# Patient Record
Sex: Female | Born: 1935
Health system: Southern US, Community
[De-identification: ages and names within clinical notes are randomized; demographics above are authoritative.]

## PROBLEM LIST (undated history)

## (undated) DIAGNOSIS — M810 Age-related osteoporosis without current pathological fracture: Secondary | ICD-10-CM

## (undated) DIAGNOSIS — I9789 Other postprocedural complications and disorders of the circulatory system, not elsewhere classified: Secondary | ICD-10-CM

## (undated) DIAGNOSIS — J45909 Unspecified asthma, uncomplicated: Secondary | ICD-10-CM

## (undated) DIAGNOSIS — K219 Gastro-esophageal reflux disease without esophagitis: Secondary | ICD-10-CM

## (undated) DIAGNOSIS — T8859XA Other complications of anesthesia, initial encounter: Secondary | ICD-10-CM

## (undated) DIAGNOSIS — J302 Other seasonal allergic rhinitis: Secondary | ICD-10-CM

## (undated) DIAGNOSIS — E785 Hyperlipidemia, unspecified: Secondary | ICD-10-CM

## (undated) DIAGNOSIS — I10 Essential (primary) hypertension: Secondary | ICD-10-CM

## (undated) DIAGNOSIS — I4891 Unspecified atrial fibrillation: Secondary | ICD-10-CM

## (undated) DIAGNOSIS — M1990 Unspecified osteoarthritis, unspecified site: Secondary | ICD-10-CM

## (undated) DIAGNOSIS — J189 Pneumonia, unspecified organism: Secondary | ICD-10-CM

## (undated) DIAGNOSIS — Z9889 Other specified postprocedural states: Secondary | ICD-10-CM

## (undated) DIAGNOSIS — Z9289 Personal history of other medical treatment: Secondary | ICD-10-CM

## (undated) DIAGNOSIS — I499 Cardiac arrhythmia, unspecified: Secondary | ICD-10-CM

## (undated) DIAGNOSIS — R112 Nausea with vomiting, unspecified: Secondary | ICD-10-CM

## (undated) HISTORY — DX: Cardiac arrhythmia, unspecified: I49.9

## (undated) HISTORY — DX: Other seasonal allergic rhinitis: J30.2

## (undated) HISTORY — DX: Other postprocedural complications and disorders of the circulatory system, not elsewhere classified: I97.89

## (undated) HISTORY — DX: Age-related osteoporosis without current pathological fracture: M81.0

## (undated) HISTORY — DX: Essential (primary) hypertension: I10

## (undated) HISTORY — DX: Unspecified atrial fibrillation: I48.91

## (undated) HISTORY — DX: Hyperlipidemia, unspecified: E78.5

## (undated) HISTORY — DX: Personal history of other medical treatment: Z92.89

## (undated) HISTORY — DX: Gastro-esophageal reflux disease without esophagitis: K21.9

---

## 1959-01-12 HISTORY — PX: APPENDECTOMY: SHX54

## 1997-01-11 HISTORY — PX: HERNIA REPAIR: SHX51

## 2000-01-12 HISTORY — PX: BLADDER SURGERY: SHX569

## 2001-07-12 ENCOUNTER — Encounter: Payer: Self-pay | Admitting: Family Medicine

## 2001-07-12 ENCOUNTER — Encounter: Admission: RE | Admit: 2001-07-12 | Discharge: 2001-07-12 | Payer: Self-pay | Admitting: Family Medicine

## 2002-02-24 ENCOUNTER — Encounter: Admission: RE | Admit: 2002-02-24 | Discharge: 2002-02-24 | Payer: Self-pay | Admitting: Sports Medicine

## 2002-02-24 ENCOUNTER — Encounter: Payer: Self-pay | Admitting: Sports Medicine

## 2002-02-26 ENCOUNTER — Encounter: Admission: RE | Admit: 2002-02-26 | Discharge: 2002-02-26 | Payer: Self-pay | Admitting: Sports Medicine

## 2002-02-26 ENCOUNTER — Encounter: Payer: Self-pay | Admitting: Sports Medicine

## 2002-02-27 ENCOUNTER — Encounter: Admission: RE | Admit: 2002-02-27 | Discharge: 2002-02-27 | Payer: Self-pay | Admitting: Sports Medicine

## 2002-02-27 ENCOUNTER — Encounter: Payer: Self-pay | Admitting: Sports Medicine

## 2003-04-18 ENCOUNTER — Encounter: Admission: RE | Admit: 2003-04-18 | Discharge: 2003-04-18 | Payer: Self-pay | Admitting: Internal Medicine

## 2003-08-01 ENCOUNTER — Other Ambulatory Visit: Admission: RE | Admit: 2003-08-01 | Discharge: 2003-08-01 | Payer: Self-pay | Admitting: Obstetrics and Gynecology

## 2003-08-16 ENCOUNTER — Ambulatory Visit (HOSPITAL_COMMUNITY): Admission: RE | Admit: 2003-08-16 | Discharge: 2003-08-16 | Payer: Self-pay | Admitting: Obstetrics and Gynecology

## 2004-02-17 ENCOUNTER — Ambulatory Visit (HOSPITAL_COMMUNITY): Admission: RE | Admit: 2004-02-17 | Discharge: 2004-02-17 | Payer: Self-pay | Admitting: Gastroenterology

## 2004-02-17 ENCOUNTER — Encounter (INDEPENDENT_AMBULATORY_CARE_PROVIDER_SITE_OTHER): Payer: Self-pay | Admitting: *Deleted

## 2004-08-03 ENCOUNTER — Encounter: Admission: RE | Admit: 2004-08-03 | Discharge: 2004-08-03 | Payer: Self-pay | Admitting: Obstetrics and Gynecology

## 2005-03-24 ENCOUNTER — Encounter: Admission: RE | Admit: 2005-03-24 | Discharge: 2005-03-24 | Payer: Self-pay | Admitting: Otolaryngology

## 2005-09-20 ENCOUNTER — Encounter: Admission: RE | Admit: 2005-09-20 | Discharge: 2005-09-20 | Payer: Self-pay | Admitting: Obstetrics and Gynecology

## 2006-02-17 ENCOUNTER — Ambulatory Visit (HOSPITAL_COMMUNITY): Admission: RE | Admit: 2006-02-17 | Discharge: 2006-02-17 | Payer: Self-pay | Admitting: *Deleted

## 2006-02-24 ENCOUNTER — Ambulatory Visit: Admission: RE | Admit: 2006-02-24 | Discharge: 2006-02-24 | Payer: Self-pay | Admitting: Internal Medicine

## 2006-12-02 ENCOUNTER — Encounter: Admission: RE | Admit: 2006-12-02 | Discharge: 2006-12-02 | Payer: Self-pay | Admitting: Obstetrics and Gynecology

## 2008-01-16 ENCOUNTER — Encounter: Admission: RE | Admit: 2008-01-16 | Discharge: 2008-01-16 | Payer: Self-pay | Admitting: Obstetrics and Gynecology

## 2008-06-24 ENCOUNTER — Ambulatory Visit (HOSPITAL_COMMUNITY): Admission: RE | Admit: 2008-06-24 | Discharge: 2008-06-24 | Payer: Self-pay | Admitting: Physician Assistant

## 2009-01-11 DIAGNOSIS — I4891 Unspecified atrial fibrillation: Secondary | ICD-10-CM

## 2009-01-11 HISTORY — DX: Unspecified atrial fibrillation: I48.91

## 2009-01-11 HISTORY — PX: COSMETIC SURGERY: SHX468

## 2009-03-25 ENCOUNTER — Encounter: Admission: RE | Admit: 2009-03-25 | Discharge: 2009-03-25 | Payer: Self-pay | Admitting: Obstetrics and Gynecology

## 2009-05-06 HISTORY — PX: OTHER SURGICAL HISTORY: SHX169

## 2009-05-11 DIAGNOSIS — Z9289 Personal history of other medical treatment: Secondary | ICD-10-CM

## 2009-05-11 HISTORY — PX: TRANSTHORACIC ECHOCARDIOGRAM: SHX275

## 2009-05-11 HISTORY — DX: Personal history of other medical treatment: Z92.89

## 2009-05-11 HISTORY — PX: NM MYOVIEW LTD: HXRAD82

## 2010-02-01 ENCOUNTER — Encounter: Payer: Self-pay | Admitting: Internal Medicine

## 2010-02-01 ENCOUNTER — Encounter: Payer: Self-pay | Admitting: Obstetrics and Gynecology

## 2010-02-16 ENCOUNTER — Other Ambulatory Visit: Payer: Self-pay | Admitting: Obstetrics and Gynecology

## 2010-02-16 DIAGNOSIS — Z1239 Encounter for other screening for malignant neoplasm of breast: Secondary | ICD-10-CM

## 2010-03-27 ENCOUNTER — Ambulatory Visit: Payer: Self-pay

## 2010-04-01 ENCOUNTER — Ambulatory Visit
Admission: RE | Admit: 2010-04-01 | Discharge: 2010-04-01 | Disposition: A | Discharge status: Home / Self Care | Payer: Medicare Other | Source: Ambulatory Visit | Attending: Obstetrics and Gynecology | Admitting: Obstetrics and Gynecology

## 2010-04-01 DIAGNOSIS — Z1239 Encounter for other screening for malignant neoplasm of breast: Secondary | ICD-10-CM

## 2010-05-29 NOTE — Op Note (Signed)
NAME:  Deborah Mcmillan, Deborah Mcmillan               ACCOUNT NO.:  [id number]   MEDICAL RECORD NO.:  [id number]          PATIENT TYPE:  AMB   LOCATION:  ENDO                         FACILITY:  [facility]   PHYSICIAN:  Georgiana Spinner, M.D.    DATE OF BIRTH:  [birth year]   DATE OF PROCEDURE:  02/17/2006  DATE OF DISCHARGE:                               OPERATIVE REPORT   PROCEDURE:  Upper endoscopy with dilation.   INDICATIONS:  Dysphagia.   ANESTHESIA:  Fentanyl 100 mcg and Versed 8 mg.   PROCEDURE:  With the patient mildly sedated in the left lateral  decubitus position, the Pentax videoscopic endoscope was inserted in the  mouth and passed under direct vision through the esophagus which  appeared normal until we reached the distal esophagus and appeared to be  a mild tightening and stricturing of the squamocolumnar junction.  No  hiatal hernia was seen at this point.  We entered into the stomach.  Fundus, body, antrum, duodenal bulb, second portion duodenum were  visualized.  From this point the endoscope was slowly withdrawn taking  circumferential views of duodenal mucosa until the endoscope had been  pulled back into the stomach, placed in retroflexion to view the stomach  from below and the stricture could be seen from below along with a good  wrap of the GE junction around the endoscope, status post  fundoplication.  The endoscope was then straightened and a guidewire was  passed.  Endoscope was withdrawn.  Subsequently a 17 Savary dilator was  passed over the guidewire with minimal resistance.  The endoscope was  then reinserted after the guidewire and dilator were removed.  A small  bleeding was seen at the squamocolumnar junction, but otherwise the  reexamination was unremarkable.  The endoscope was withdrawn.  The  patient's vital signs and pulse oximeter remained stable.  The patient  tolerated the procedure well without apparent complication.   FINDINGS:  Stricture of distal esophagus dilated  to 17 Savary, await  clinical response.  The patient will follow-up with me as needed as an  outpatient.           ______________________________  Georgiana Spinner, M.D.     GMO/MEDQ  D:  02/17/2006  T:  02/17/2006  Job:  [id number]

## 2010-05-29 NOTE — Op Note (Signed)
NAME:  Vasallo, [place]               ACCOUNT NO.:  [id number]   MEDICAL RECORD NO.:  [id number]          PATIENT TYPE:  AMB   LOCATION:  ENDO                         FACILITY:  [facility]   PHYSICIAN:  Anselmo Rod, M.D.  DATE OF BIRTH:  1934/09/04   DATE OF PROCEDURE:  02/17/2004  DATE OF DISCHARGE:                                 OPERATIVE REPORT   PROCEDURE PERFORMED:  Colonoscopy with cold biopsies x3.   ENDOSCOPIST:  Anselmo Rod, M.D.   INSTRUMENT USED:  Olympus video colonoscope.   INDICATIONS FOR PROCEDURE:  A 76 year old female underwent a screening  colonoscopy to rule out colonic polyps, masses, etc.   PREPROCEDURE PREPARATION:  Informed consent was procured from the patient.  The patient was fasted for eight hours prior to the procedure and prepped  with a bottle of magnesium citrate and a gallon of GoLYTELY the night prior  to the procedure.   PREPROCEDURE PHYSICAL:  VITAL SIGNS:  The patient had stable vital signs.  NECK:  Supple.  CHEST: Clear to auscultation.  S1 and S2 regular.  ABDOMEN: Soft with normal bowel sounds.   DESCRIPTION OF PROCEDURE:  The patient was placed in the left lateral  decubitus position, sedated with 70 mg of Demerol and 7.5 mg of Versed in  slow incremental doses.  Once the patient was adequately sedated and  maintained on low flow oxygen and continuous cardiac monitoring, the Olympus  video colonoscope was advanced from the rectum to the cecum.  A small  sessile polyp was biopsied from the cecum.  This was done with cold biopsies  x3.  No other masses, polyps, erosions, ulcerations, or diverticula were  seen.  Retroflexion in the rectum revealed no abnormalities.  The patient  tolerated the procedure well without immediate complications.   IMPRESSION:  1.  Small sessile polyp, biopsied from the cecum, (cold biopsies x3).  2.  No other mass or polyp seen.  3.  No evidence of diverticulosis.   RECOMMENDATIONS:  1.  Await pathology  results.  2.  Avoid nonsteroidal's including aspirin for the next two weeks.  3.  Repeat colonoscopy depending on pathology results.  4.  Outpatient follow up as need arises in the future.      JNM/MEDQ  D:  02/18/2004  T:  02/18/2004  Job:  [id number]   cc:   Olene Craven, M.D.  [address]  [address]  [place]  [place] [address]  Fax: [phone]

## 2011-02-10 DIAGNOSIS — J40 Bronchitis, not specified as acute or chronic: Secondary | ICD-10-CM | POA: Diagnosis not present

## 2011-02-10 DIAGNOSIS — J45902 Unspecified asthma with status asthmaticus: Secondary | ICD-10-CM | POA: Diagnosis not present

## 2011-03-08 DIAGNOSIS — R05 Cough: Secondary | ICD-10-CM | POA: Diagnosis not present

## 2011-03-08 DIAGNOSIS — R0602 Shortness of breath: Secondary | ICD-10-CM | POA: Diagnosis not present

## 2011-03-08 DIAGNOSIS — J0100 Acute maxillary sinusitis, unspecified: Secondary | ICD-10-CM | POA: Diagnosis not present

## 2011-03-08 DIAGNOSIS — J1289 Other viral pneumonia: Secondary | ICD-10-CM | POA: Diagnosis not present

## 2011-04-05 ENCOUNTER — Ambulatory Visit
Admission: RE | Admit: 2011-04-05 | Discharge: 2011-04-05 | Disposition: A | Discharge status: Home / Self Care | Payer: Medicare Other | Source: Ambulatory Visit | Attending: Internal Medicine | Admitting: Internal Medicine

## 2011-04-05 ENCOUNTER — Other Ambulatory Visit: Payer: Self-pay | Admitting: Internal Medicine

## 2011-04-05 DIAGNOSIS — R221 Localized swelling, mass and lump, neck: Secondary | ICD-10-CM

## 2011-04-05 DIAGNOSIS — E042 Nontoxic multinodular goiter: Secondary | ICD-10-CM | POA: Diagnosis not present

## 2011-04-05 DIAGNOSIS — E7800 Pure hypercholesterolemia, unspecified: Secondary | ICD-10-CM | POA: Diagnosis not present

## 2011-04-05 DIAGNOSIS — Z79899 Other long term (current) drug therapy: Secondary | ICD-10-CM | POA: Diagnosis not present

## 2011-04-05 DIAGNOSIS — N390 Urinary tract infection, site not specified: Secondary | ICD-10-CM | POA: Diagnosis not present

## 2011-04-05 DIAGNOSIS — L259 Unspecified contact dermatitis, unspecified cause: Secondary | ICD-10-CM | POA: Diagnosis not present

## 2011-04-05 DIAGNOSIS — R220 Localized swelling, mass and lump, head: Secondary | ICD-10-CM | POA: Diagnosis not present

## 2011-04-05 DIAGNOSIS — I10 Essential (primary) hypertension: Secondary | ICD-10-CM | POA: Diagnosis not present

## 2011-04-08 ENCOUNTER — Other Ambulatory Visit: Payer: Self-pay | Admitting: Internal Medicine

## 2011-04-08 DIAGNOSIS — K118 Other diseases of salivary glands: Secondary | ICD-10-CM

## 2011-04-12 ENCOUNTER — Ambulatory Visit
Admission: RE | Admit: 2011-04-12 | Discharge: 2011-04-12 | Disposition: A | Discharge status: Home / Self Care | Payer: Medicare Other | Source: Ambulatory Visit | Attending: Internal Medicine | Admitting: Internal Medicine

## 2011-04-12 DIAGNOSIS — R221 Localized swelling, mass and lump, neck: Secondary | ICD-10-CM | POA: Diagnosis not present

## 2011-04-12 DIAGNOSIS — K118 Other diseases of salivary glands: Secondary | ICD-10-CM

## 2011-04-12 DIAGNOSIS — R220 Localized swelling, mass and lump, head: Secondary | ICD-10-CM | POA: Diagnosis not present

## 2011-04-12 MED ORDER — IOHEXOL 300 MG/ML  SOLN
75.0000 mL | Freq: Once | INTRAMUSCULAR | Status: AC | PRN
  Administered 2011-04-12: 75 mL via INTRAVENOUS

## 2011-04-13 ENCOUNTER — Other Ambulatory Visit: Payer: Self-pay | Admitting: Obstetrics and Gynecology

## 2011-04-13 DIAGNOSIS — Z1231 Encounter for screening mammogram for malignant neoplasm of breast: Secondary | ICD-10-CM

## 2011-04-22 ENCOUNTER — Ambulatory Visit
Admission: RE | Admit: 2011-04-22 | Discharge: 2011-04-22 | Disposition: A | Discharge status: Home / Self Care | Payer: Medicare Other | Source: Ambulatory Visit | Attending: Obstetrics and Gynecology | Admitting: Obstetrics and Gynecology

## 2011-04-22 DIAGNOSIS — Z1231 Encounter for screening mammogram for malignant neoplasm of breast: Secondary | ICD-10-CM

## 2011-04-26 ENCOUNTER — Other Ambulatory Visit: Payer: Self-pay | Admitting: Obstetrics and Gynecology

## 2011-04-26 DIAGNOSIS — R928 Other abnormal and inconclusive findings on diagnostic imaging of breast: Secondary | ICD-10-CM

## 2011-04-27 DIAGNOSIS — M79609 Pain in unspecified limb: Secondary | ICD-10-CM | POA: Diagnosis not present

## 2011-04-27 DIAGNOSIS — R918 Other nonspecific abnormal finding of lung field: Secondary | ICD-10-CM | POA: Diagnosis not present

## 2011-04-27 DIAGNOSIS — M25519 Pain in unspecified shoulder: Secondary | ICD-10-CM | POA: Diagnosis not present

## 2011-04-29 ENCOUNTER — Ambulatory Visit
Admission: RE | Admit: 2011-04-29 | Discharge: 2011-04-29 | Disposition: A | Discharge status: Home / Self Care | Payer: Medicare Other | Source: Ambulatory Visit | Attending: Obstetrics and Gynecology | Admitting: Obstetrics and Gynecology

## 2011-04-29 DIAGNOSIS — R928 Other abnormal and inconclusive findings on diagnostic imaging of breast: Secondary | ICD-10-CM | POA: Diagnosis not present

## 2011-05-06 DIAGNOSIS — M25519 Pain in unspecified shoulder: Secondary | ICD-10-CM | POA: Diagnosis not present

## 2011-05-06 DIAGNOSIS — M722 Plantar fascial fibromatosis: Secondary | ICD-10-CM | POA: Diagnosis not present

## 2011-05-06 DIAGNOSIS — M25569 Pain in unspecified knee: Secondary | ICD-10-CM | POA: Diagnosis not present

## 2011-05-10 DIAGNOSIS — J1100 Influenza due to unidentified influenza virus with unspecified type of pneumonia: Secondary | ICD-10-CM | POA: Diagnosis not present

## 2011-05-10 DIAGNOSIS — Z0000 Encounter for general adult medical examination without abnormal findings: Secondary | ICD-10-CM | POA: Diagnosis not present

## 2011-05-10 DIAGNOSIS — J189 Pneumonia, unspecified organism: Secondary | ICD-10-CM | POA: Diagnosis not present

## 2011-05-10 DIAGNOSIS — J984 Other disorders of lung: Secondary | ICD-10-CM | POA: Diagnosis not present

## 2011-05-10 DIAGNOSIS — I10 Essential (primary) hypertension: Secondary | ICD-10-CM | POA: Diagnosis not present

## 2011-05-13 DIAGNOSIS — G2581 Restless legs syndrome: Secondary | ICD-10-CM | POA: Diagnosis not present

## 2011-05-13 DIAGNOSIS — M545 Low back pain: Secondary | ICD-10-CM | POA: Diagnosis not present

## 2011-05-13 DIAGNOSIS — R002 Palpitations: Secondary | ICD-10-CM | POA: Diagnosis not present

## 2011-05-13 DIAGNOSIS — Z0000 Encounter for general adult medical examination without abnormal findings: Secondary | ICD-10-CM | POA: Diagnosis not present

## 2011-05-13 DIAGNOSIS — D72829 Elevated white blood cell count, unspecified: Secondary | ICD-10-CM | POA: Diagnosis not present

## 2011-05-13 DIAGNOSIS — I10 Essential (primary) hypertension: Secondary | ICD-10-CM | POA: Diagnosis not present

## 2011-05-18 DIAGNOSIS — R5381 Other malaise: Secondary | ICD-10-CM | POA: Diagnosis not present

## 2011-05-18 DIAGNOSIS — N390 Urinary tract infection, site not specified: Secondary | ICD-10-CM | POA: Diagnosis not present

## 2011-05-18 DIAGNOSIS — J069 Acute upper respiratory infection, unspecified: Secondary | ICD-10-CM | POA: Diagnosis not present

## 2011-05-18 DIAGNOSIS — D72829 Elevated white blood cell count, unspecified: Secondary | ICD-10-CM | POA: Diagnosis not present

## 2011-05-25 DIAGNOSIS — R5383 Other fatigue: Secondary | ICD-10-CM | POA: Diagnosis not present

## 2011-05-25 DIAGNOSIS — G4700 Insomnia, unspecified: Secondary | ICD-10-CM | POA: Diagnosis not present

## 2011-05-25 DIAGNOSIS — J069 Acute upper respiratory infection, unspecified: Secondary | ICD-10-CM | POA: Diagnosis not present

## 2011-05-25 DIAGNOSIS — R5381 Other malaise: Secondary | ICD-10-CM | POA: Diagnosis not present

## 2011-05-25 DIAGNOSIS — D72829 Elevated white blood cell count, unspecified: Secondary | ICD-10-CM | POA: Diagnosis not present

## 2011-07-29 DIAGNOSIS — R002 Palpitations: Secondary | ICD-10-CM | POA: Diagnosis not present

## 2011-07-29 DIAGNOSIS — I10 Essential (primary) hypertension: Secondary | ICD-10-CM | POA: Diagnosis not present

## 2011-08-04 DIAGNOSIS — M722 Plantar fascial fibromatosis: Secondary | ICD-10-CM | POA: Diagnosis not present

## 2011-08-10 DIAGNOSIS — M722 Plantar fascial fibromatosis: Secondary | ICD-10-CM | POA: Diagnosis not present

## 2011-08-12 DIAGNOSIS — Z79899 Other long term (current) drug therapy: Secondary | ICD-10-CM | POA: Diagnosis not present

## 2011-08-12 DIAGNOSIS — M722 Plantar fascial fibromatosis: Secondary | ICD-10-CM | POA: Diagnosis not present

## 2011-08-12 DIAGNOSIS — E7800 Pure hypercholesterolemia, unspecified: Secondary | ICD-10-CM | POA: Diagnosis not present

## 2011-08-16 DIAGNOSIS — M722 Plantar fascial fibromatosis: Secondary | ICD-10-CM | POA: Diagnosis not present

## 2011-08-17 DIAGNOSIS — D72829 Elevated white blood cell count, unspecified: Secondary | ICD-10-CM | POA: Diagnosis not present

## 2011-08-17 DIAGNOSIS — Z7982 Long term (current) use of aspirin: Secondary | ICD-10-CM | POA: Diagnosis not present

## 2011-08-17 DIAGNOSIS — E669 Obesity, unspecified: Secondary | ICD-10-CM | POA: Diagnosis not present

## 2011-08-17 DIAGNOSIS — E7800 Pure hypercholesterolemia, unspecified: Secondary | ICD-10-CM | POA: Diagnosis not present

## 2011-08-18 DIAGNOSIS — M722 Plantar fascial fibromatosis: Secondary | ICD-10-CM | POA: Diagnosis not present

## 2011-08-25 DIAGNOSIS — M722 Plantar fascial fibromatosis: Secondary | ICD-10-CM | POA: Diagnosis not present

## 2011-08-31 DIAGNOSIS — M722 Plantar fascial fibromatosis: Secondary | ICD-10-CM | POA: Diagnosis not present

## 2011-09-16 DIAGNOSIS — M949 Disorder of cartilage, unspecified: Secondary | ICD-10-CM | POA: Diagnosis not present

## 2011-09-16 DIAGNOSIS — M899 Disorder of bone, unspecified: Secondary | ICD-10-CM | POA: Diagnosis not present

## 2011-09-16 DIAGNOSIS — Z124 Encounter for screening for malignant neoplasm of cervix: Secondary | ICD-10-CM | POA: Diagnosis not present

## 2011-09-16 DIAGNOSIS — Z13820 Encounter for screening for osteoporosis: Secondary | ICD-10-CM | POA: Diagnosis not present

## 2011-09-23 DIAGNOSIS — Z79899 Other long term (current) drug therapy: Secondary | ICD-10-CM | POA: Diagnosis not present

## 2011-09-23 DIAGNOSIS — M810 Age-related osteoporosis without current pathological fracture: Secondary | ICD-10-CM | POA: Diagnosis not present

## 2011-09-23 DIAGNOSIS — E7800 Pure hypercholesterolemia, unspecified: Secondary | ICD-10-CM | POA: Diagnosis not present

## 2011-09-23 DIAGNOSIS — E559 Vitamin D deficiency, unspecified: Secondary | ICD-10-CM | POA: Diagnosis not present

## 2011-09-28 DIAGNOSIS — M25559 Pain in unspecified hip: Secondary | ICD-10-CM | POA: Diagnosis not present

## 2011-09-28 DIAGNOSIS — G4700 Insomnia, unspecified: Secondary | ICD-10-CM | POA: Diagnosis not present

## 2011-09-28 DIAGNOSIS — E7800 Pure hypercholesterolemia, unspecified: Secondary | ICD-10-CM | POA: Diagnosis not present

## 2011-09-28 DIAGNOSIS — M76899 Other specified enthesopathies of unspecified lower limb, excluding foot: Secondary | ICD-10-CM | POA: Diagnosis not present

## 2011-09-30 DIAGNOSIS — I10 Essential (primary) hypertension: Secondary | ICD-10-CM | POA: Diagnosis not present

## 2011-09-30 DIAGNOSIS — R0602 Shortness of breath: Secondary | ICD-10-CM | POA: Diagnosis not present

## 2011-09-30 DIAGNOSIS — I4891 Unspecified atrial fibrillation: Secondary | ICD-10-CM | POA: Diagnosis not present

## 2011-10-13 DIAGNOSIS — M76899 Other specified enthesopathies of unspecified lower limb, excluding foot: Secondary | ICD-10-CM | POA: Diagnosis not present

## 2011-10-19 ENCOUNTER — Other Ambulatory Visit: Payer: Self-pay | Admitting: Obstetrics and Gynecology

## 2011-10-19 DIAGNOSIS — R921 Mammographic calcification found on diagnostic imaging of breast: Secondary | ICD-10-CM

## 2011-10-26 DIAGNOSIS — Z23 Encounter for immunization: Secondary | ICD-10-CM | POA: Diagnosis not present

## 2011-11-02 ENCOUNTER — Ambulatory Visit
Admission: RE | Admit: 2011-11-02 | Discharge: 2011-11-02 | Disposition: A | Discharge status: Home / Self Care | Payer: Medicare Other | Source: Ambulatory Visit | Attending: Obstetrics and Gynecology | Admitting: Obstetrics and Gynecology

## 2011-11-02 DIAGNOSIS — R928 Other abnormal and inconclusive findings on diagnostic imaging of breast: Secondary | ICD-10-CM | POA: Diagnosis not present

## 2011-11-02 DIAGNOSIS — R921 Mammographic calcification found on diagnostic imaging of breast: Secondary | ICD-10-CM

## 2011-11-26 DIAGNOSIS — E7800 Pure hypercholesterolemia, unspecified: Secondary | ICD-10-CM | POA: Diagnosis not present

## 2011-11-26 DIAGNOSIS — Z79899 Other long term (current) drug therapy: Secondary | ICD-10-CM | POA: Diagnosis not present

## 2011-11-30 DIAGNOSIS — E7800 Pure hypercholesterolemia, unspecified: Secondary | ICD-10-CM | POA: Diagnosis not present

## 2011-11-30 DIAGNOSIS — G4700 Insomnia, unspecified: Secondary | ICD-10-CM | POA: Diagnosis not present

## 2011-12-01 DIAGNOSIS — E782 Mixed hyperlipidemia: Secondary | ICD-10-CM | POA: Diagnosis not present

## 2011-12-01 DIAGNOSIS — R079 Chest pain, unspecified: Secondary | ICD-10-CM | POA: Diagnosis not present

## 2011-12-01 DIAGNOSIS — I10 Essential (primary) hypertension: Secondary | ICD-10-CM | POA: Diagnosis not present

## 2012-01-07 DIAGNOSIS — E7800 Pure hypercholesterolemia, unspecified: Secondary | ICD-10-CM | POA: Diagnosis not present

## 2012-01-11 DIAGNOSIS — I6529 Occlusion and stenosis of unspecified carotid artery: Secondary | ICD-10-CM | POA: Diagnosis not present

## 2012-01-11 DIAGNOSIS — K219 Gastro-esophageal reflux disease without esophagitis: Secondary | ICD-10-CM | POA: Diagnosis not present

## 2012-01-11 DIAGNOSIS — E7800 Pure hypercholesterolemia, unspecified: Secondary | ICD-10-CM | POA: Diagnosis not present

## 2012-01-14 DIAGNOSIS — D239 Other benign neoplasm of skin, unspecified: Secondary | ICD-10-CM | POA: Diagnosis not present

## 2012-01-14 DIAGNOSIS — L909 Atrophic disorder of skin, unspecified: Secondary | ICD-10-CM | POA: Diagnosis not present

## 2012-01-14 DIAGNOSIS — L919 Hypertrophic disorder of the skin, unspecified: Secondary | ICD-10-CM | POA: Diagnosis not present

## 2012-01-14 DIAGNOSIS — L570 Actinic keratosis: Secondary | ICD-10-CM | POA: Diagnosis not present

## 2012-01-14 DIAGNOSIS — L821 Other seborrheic keratosis: Secondary | ICD-10-CM | POA: Diagnosis not present

## 2012-01-14 DIAGNOSIS — L723 Sebaceous cyst: Secondary | ICD-10-CM | POA: Diagnosis not present

## 2012-01-25 ENCOUNTER — Other Ambulatory Visit: Payer: Self-pay | Admitting: Internal Medicine

## 2012-01-25 DIAGNOSIS — I6529 Occlusion and stenosis of unspecified carotid artery: Secondary | ICD-10-CM

## 2012-02-03 ENCOUNTER — Ambulatory Visit
Admission: RE | Admit: 2012-02-03 | Discharge: 2012-02-03 | Disposition: A | Discharge status: Home / Self Care | Payer: Medicare Other | Source: Ambulatory Visit | Attending: Internal Medicine | Admitting: Internal Medicine

## 2012-02-03 DIAGNOSIS — I658 Occlusion and stenosis of other precerebral arteries: Secondary | ICD-10-CM | POA: Diagnosis not present

## 2012-02-03 DIAGNOSIS — I6529 Occlusion and stenosis of unspecified carotid artery: Secondary | ICD-10-CM

## 2012-02-22 DIAGNOSIS — E7800 Pure hypercholesterolemia, unspecified: Secondary | ICD-10-CM | POA: Diagnosis not present

## 2012-03-01 DIAGNOSIS — I10 Essential (primary) hypertension: Secondary | ICD-10-CM | POA: Diagnosis not present

## 2012-03-01 DIAGNOSIS — E789 Disorder of lipoprotein metabolism, unspecified: Secondary | ICD-10-CM | POA: Diagnosis not present

## 2012-03-01 DIAGNOSIS — J45909 Unspecified asthma, uncomplicated: Secondary | ICD-10-CM | POA: Diagnosis not present

## 2012-03-02 DIAGNOSIS — E7800 Pure hypercholesterolemia, unspecified: Secondary | ICD-10-CM | POA: Diagnosis not present

## 2012-03-29 DIAGNOSIS — E7800 Pure hypercholesterolemia, unspecified: Secondary | ICD-10-CM | POA: Diagnosis not present

## 2012-04-05 DIAGNOSIS — R0609 Other forms of dyspnea: Secondary | ICD-10-CM | POA: Diagnosis not present

## 2012-04-05 DIAGNOSIS — R0989 Other specified symptoms and signs involving the circulatory and respiratory systems: Secondary | ICD-10-CM | POA: Diagnosis not present

## 2012-04-05 DIAGNOSIS — E789 Disorder of lipoprotein metabolism, unspecified: Secondary | ICD-10-CM | POA: Diagnosis not present

## 2012-04-06 ENCOUNTER — Other Ambulatory Visit: Payer: Self-pay | Admitting: Obstetrics and Gynecology

## 2012-04-06 DIAGNOSIS — R921 Mammographic calcification found on diagnostic imaging of breast: Secondary | ICD-10-CM

## 2012-04-13 DIAGNOSIS — M1990 Unspecified osteoarthritis, unspecified site: Secondary | ICD-10-CM | POA: Diagnosis not present

## 2012-04-13 DIAGNOSIS — M25569 Pain in unspecified knee: Secondary | ICD-10-CM | POA: Diagnosis not present

## 2012-05-08 ENCOUNTER — Ambulatory Visit
Admission: RE | Admit: 2012-05-08 | Discharge: 2012-05-08 | Disposition: A | Discharge status: Home / Self Care | Payer: Medicare Other | Source: Ambulatory Visit | Attending: Obstetrics and Gynecology | Admitting: Obstetrics and Gynecology

## 2012-05-08 ENCOUNTER — Other Ambulatory Visit: Payer: Self-pay | Admitting: Obstetrics and Gynecology

## 2012-05-08 DIAGNOSIS — R921 Mammographic calcification found on diagnostic imaging of breast: Secondary | ICD-10-CM

## 2012-05-08 DIAGNOSIS — R928 Other abnormal and inconclusive findings on diagnostic imaging of breast: Secondary | ICD-10-CM | POA: Diagnosis not present

## 2012-05-09 ENCOUNTER — Other Ambulatory Visit: Payer: Self-pay | Admitting: Obstetrics and Gynecology

## 2012-05-09 ENCOUNTER — Ambulatory Visit
Admission: RE | Admit: 2012-05-09 | Discharge: 2012-05-09 | Disposition: A | Discharge status: Home / Self Care | Payer: Medicare Other | Source: Ambulatory Visit | Attending: Obstetrics and Gynecology | Admitting: Obstetrics and Gynecology

## 2012-05-09 DIAGNOSIS — R928 Other abnormal and inconclusive findings on diagnostic imaging of breast: Secondary | ICD-10-CM | POA: Diagnosis not present

## 2012-05-09 DIAGNOSIS — R921 Mammographic calcification found on diagnostic imaging of breast: Secondary | ICD-10-CM

## 2012-05-09 DIAGNOSIS — D249 Benign neoplasm of unspecified breast: Secondary | ICD-10-CM | POA: Diagnosis not present

## 2012-05-29 DIAGNOSIS — H2510 Age-related nuclear cataract, unspecified eye: Secondary | ICD-10-CM | POA: Diagnosis not present

## 2012-06-19 DIAGNOSIS — Z006 Encounter for examination for normal comparison and control in clinical research program: Secondary | ICD-10-CM | POA: Diagnosis not present

## 2012-06-19 DIAGNOSIS — I10 Essential (primary) hypertension: Secondary | ICD-10-CM | POA: Diagnosis not present

## 2012-06-19 DIAGNOSIS — J45909 Unspecified asthma, uncomplicated: Secondary | ICD-10-CM | POA: Diagnosis not present

## 2012-06-19 DIAGNOSIS — G2581 Restless legs syndrome: Secondary | ICD-10-CM | POA: Diagnosis not present

## 2012-06-19 DIAGNOSIS — G4700 Insomnia, unspecified: Secondary | ICD-10-CM | POA: Diagnosis not present

## 2012-06-19 DIAGNOSIS — J329 Chronic sinusitis, unspecified: Secondary | ICD-10-CM | POA: Diagnosis not present

## 2012-07-26 DIAGNOSIS — E789 Disorder of lipoprotein metabolism, unspecified: Secondary | ICD-10-CM | POA: Diagnosis not present

## 2012-08-02 ENCOUNTER — Other Ambulatory Visit: Payer: Self-pay | Admitting: Cardiology

## 2012-08-03 DIAGNOSIS — E789 Disorder of lipoprotein metabolism, unspecified: Secondary | ICD-10-CM | POA: Diagnosis not present

## 2012-08-03 DIAGNOSIS — R0989 Other specified symptoms and signs involving the circulatory and respiratory systems: Secondary | ICD-10-CM | POA: Diagnosis not present

## 2012-08-03 DIAGNOSIS — R0609 Other forms of dyspnea: Secondary | ICD-10-CM | POA: Diagnosis not present

## 2012-10-10 DIAGNOSIS — M76899 Other specified enthesopathies of unspecified lower limb, excluding foot: Secondary | ICD-10-CM | POA: Diagnosis not present

## 2012-10-10 DIAGNOSIS — M25519 Pain in unspecified shoulder: Secondary | ICD-10-CM | POA: Diagnosis not present

## 2012-10-14 DIAGNOSIS — Z23 Encounter for immunization: Secondary | ICD-10-CM | POA: Diagnosis not present

## 2012-10-18 DIAGNOSIS — M76899 Other specified enthesopathies of unspecified lower limb, excluding foot: Secondary | ICD-10-CM | POA: Diagnosis not present

## 2012-10-26 DIAGNOSIS — M76899 Other specified enthesopathies of unspecified lower limb, excluding foot: Secondary | ICD-10-CM | POA: Diagnosis not present

## 2012-10-27 DIAGNOSIS — J309 Allergic rhinitis, unspecified: Secondary | ICD-10-CM | POA: Diagnosis not present

## 2012-10-27 DIAGNOSIS — J329 Chronic sinusitis, unspecified: Secondary | ICD-10-CM | POA: Diagnosis not present

## 2012-10-27 DIAGNOSIS — E7800 Pure hypercholesterolemia, unspecified: Secondary | ICD-10-CM | POA: Diagnosis not present

## 2012-10-31 ENCOUNTER — Telehealth: Payer: Self-pay | Admitting: Cardiology

## 2012-10-31 NOTE — Telephone Encounter (Signed)
Deborah December, RN notified and stated she will review w/ Dr. Herbie Baltimore on Thursday when he is in clinic.    Returned call and pt verified x 2.  Pt informed message received and advised per Deborah December, RN.  Pt verbalized understanding and agreed w/ plan.  Pt also scheduled annual visit for 11.4.14 at 9:15am w/ Dr. Herbie Baltimore as she wanted the first available appt.  Pt understands Dr. Herbie Baltimore may require testing to be completed before she is seen and her appt may have to be rescheduled.

## 2012-10-31 NOTE — Telephone Encounter (Signed)
Please call-scheduling surgery on her shoulder-thinks she need to see Dr Herbie Baltimore Before surgery,i

## 2012-11-03 ENCOUNTER — Telehealth: Payer: Self-pay | Admitting: *Deleted

## 2012-11-03 NOTE — Telephone Encounter (Signed)
CLEARANCE FOR LEFT SHOULDER  SURGERY

## 2012-11-07 ENCOUNTER — Telehealth: Payer: Self-pay | Admitting: *Deleted

## 2012-11-07 NOTE — Telephone Encounter (Signed)
Spoke to patient. Informed her that Dr Herbie Baltimore cleared her for surgery but she should keep her upcoming appointment. She verbalized that she would keep the appointment

## 2012-11-08 DIAGNOSIS — N390 Urinary tract infection, site not specified: Secondary | ICD-10-CM | POA: Diagnosis not present

## 2012-11-08 DIAGNOSIS — Z79899 Other long term (current) drug therapy: Secondary | ICD-10-CM | POA: Diagnosis not present

## 2012-11-08 DIAGNOSIS — R319 Hematuria, unspecified: Secondary | ICD-10-CM | POA: Diagnosis not present

## 2012-11-13 ENCOUNTER — Encounter: Payer: Self-pay | Admitting: *Deleted

## 2012-11-14 ENCOUNTER — Ambulatory Visit (INDEPENDENT_AMBULATORY_CARE_PROVIDER_SITE_OTHER): Payer: Medicare Other | Admitting: Cardiology

## 2012-11-14 ENCOUNTER — Encounter: Payer: Self-pay | Admitting: Cardiology

## 2012-11-14 VITALS — BP 124/76 | HR 60 | Ht 61.0 in | Wt 178.0 lb

## 2012-11-14 DIAGNOSIS — E785 Hyperlipidemia, unspecified: Secondary | ICD-10-CM | POA: Insufficient documentation

## 2012-11-14 DIAGNOSIS — Z01810 Encounter for preprocedural cardiovascular examination: Secondary | ICD-10-CM | POA: Diagnosis not present

## 2012-11-14 DIAGNOSIS — I519 Heart disease, unspecified: Secondary | ICD-10-CM

## 2012-11-14 DIAGNOSIS — I480 Paroxysmal atrial fibrillation: Secondary | ICD-10-CM

## 2012-11-14 DIAGNOSIS — I10 Essential (primary) hypertension: Secondary | ICD-10-CM

## 2012-11-14 DIAGNOSIS — I4891 Unspecified atrial fibrillation: Secondary | ICD-10-CM | POA: Insufficient documentation

## 2012-11-14 NOTE — Progress Notes (Signed)
PATIENT: Deborah Mcmillan MRN: [id number]  DOB: October 02, 1934   DOV:11/14/2012 PCP: Londell Moh, MD  Clinic Note: Chief Complaint  Patient presents with  . Annual Exam    has clearance for orth lft shoulder surgery not schedule yet, no chest pain , sob after climbing 3 flight stairs but recovers easily, no edema    HPI: Deborah L Erazo is a 78 y.o. female with a PMH below who presents today for annual followup. She history of postoperative atrial fibrillation back in 2011 pounds since then as far as I know she has not had any recurrent episodes besides some atrial couplets and triplets. She's had a cardiac evaluation in May 2011 with a new normal nuclear study abnormal and is mild hypertension dyslipidemia, no real significant overall cardiac symptoms.  Her major problems have been some do to her arthritis pains in her shoulder and hips.  Interval History: She comes in today doing quite well overall for cardiac standpoint. She has some chronic exertional dyspnea mostly when she goes up stairs. Other than that really no chest pains or pressure with rest or exertion. She says it exertional dyspnea is there, she has gained weight, and because of her knee and shoulder and hips she's not been doing any routine exercise.  The remainder of Cardiovascular ROS: positive for - dyspnea on exertion and palpitations negative for - chest pain, edema, loss of consciousness, murmur, orthopnea, paroxysmal nocturnal dyspnea, rapid heart rate or shortness of breath: Additional cardiac review of systems: Lightheadedness - no, dizziness - no, syncope/near-syncope - no; TIA/amaurosis fugax - no Melena - no, hematochezia no; hematuria - no; nosebleeds - no; claudication - no  Past Medical History  Diagnosis Date  . Hypertension   . Dyslipidemia   . Seasonal allergies   . Dysrhythmia     h/o atrial couplets with short PAT, h/o a-fib during surgery (event monitor)  . GERD (gastroesophageal reflux  disease)   . Osteoporosis   . History of nuclear stress test 05/2009    dipyridamole; normal, low risk   . Postoperative atrial fibrillation  2011    Prior Cardiac Evaluation and Past Surgical History: Past Surgical History  Procedure Laterality Date  . Cosmetic surgery  2011    1 episode of atrial fibrillation during this   . Appendectomy  1961  . Hernia repair  1999    hiatal  . Bladder surgery  2002  . Lump removal  05/06/2009    [facility]  . Transthoracic echocardiogram  05/2009    EF=>55%, with normal LV systolic function, impaired LV relaxation; trace MR/TR; mild AV regurg  . Nm myoview ltd  May 2011    No ischemia or infarction.   Allergies  Allergen Reactions  . Codeine     Pain & upset stomach  . Crestor [Rosuvastatin]     Statin intolerance  . Lodine [Etodolac]     Upset stomach  . Penicillins   . Sulfa Antibiotics     Pain & upset stomach  . Welchol [Colesevelam] Nausea Only and Other (See Comments)    Stomach did not feel good  . Zetia [Ezetimibe]   . Niacin And Related Rash    "on fire"  . Tetracyclines & Related Rash    Upset stomach    Current Outpatient Prescriptions  Medication Sig Dispense Refill  . albuterol (PROVENTIL) (2.5 MG/3ML) 0.083% nebulizer solution Take 2.5 mg by nebulization every 6 (six) hours as needed for wheezing.      Marland Kitchen  aspirin EC 81 MG tablet Take 81 mg by mouth daily.      . B Complex-C (SUPER B COMPLEX PO) Take by mouth.      . Calcium 150 MG TABS Take 1 tablet by mouth daily.      . Cholecalciferol (VITAMIN D) 2000 UNITS CAPS Take 1 capsule by mouth daily.      . Ergocalciferol (VITAMIN D2) 2000 UNITS TABS Take by mouth.      . fluticasone (FLONASE) 50 MCG/ACT nasal spray Place 1-2 sprays into the nose as needed.       . Magnesium 400 MG CAPS Take 400-800 mg by mouth daily.      . metoprolol succinate (TOPROL-XL) 100 MG 24 hr tablet TAKE 1 TABLET EVERY DAY  90 tablet  1  . Multiple Vitamin (MULTIVITAMIN) capsule Take 1 capsule by  mouth daily.      . Omega-3 Fatty Acids (FISH OIL PO) Take by mouth daily.      Marland Kitchen omeprazole (PRILOSEC) 20 MG capsule Take 20 mg by mouth daily.      Marland Kitchen OVER THE COUNTER MEDICATION Q10 (qunol ) 200 mg a daily      . OVER THE COUNTER MEDICATION apple cider vinegar      . OVER THE COUNTER MEDICATION garcina cambogia  1500 mg a daily      . rOPINIRole (REQUIP) 0.5 MG tablet Take 0.5 mg by mouth at bedtime.      Marland Kitchen spironolactone (ALDACTONE) 25 MG tablet Take 25 mg by mouth daily.      Marland Kitchen zolpidem (AMBIEN) 5 MG tablet Take 5 mg by mouth at bedtime as needed for sleep.       No current facility-administered medications for this visit.   History   Social History Narrative   Widowed mother of 3. Grandmother of 5 and great-grandmother 8.    Does not smoke. Does not drink alcohol.   Does not get routine exercise, do to arthritis pains.   ROS: A comprehensive Review of Systems - Negative except Symptoms above as well as mild cramping. Other musculoskeletal symptoms noted below. Musculoskeletal ROS: Right shoulder pain, likely requiring surgery that she is potentially could have done in February. Also right hip and knee pain  PHYSICAL EXAM BP 124/76  Pulse 60  Ht 5\' 1"  (1.549 m)  Wt 178 lb (80.74 kg)  BMI 33.65 kg/m2 General appearance: alert, cooperative, appears stated age, no distress and mildly obese Neck: no adenopathy, no carotid bruit and no JVD Lungs: clear to auscultation bilaterally, normal percussion bilaterally and Nonlabored, and air movement Heart: regular rate and rhythm, S1, S2 normal, no murmur, click, rub or gallop and normal apical impulse Abdomen: soft, non-tender; bowel sounds normal; no masses,  no organomegaly and Mildly obese Extremities: extremities normal, atraumatic, no cyanosis or edema, no edema, redness or tenderness in the calves or thighs and no ulcers, gangrene or trophic changes Pulses: 2+ and symmetric Neurologic: Alert and oriented X 3, normal strength and  tone. Normal symmetric reflexes. Normal coordination and gait  WUJ:[id number] today: Yes Rate: 60 , Rhythm: NSR, normal ECG  Recent Labs: None  ASSESSMENT / PLAN: Preoperative cardiovascular examination Mrs. Daubert is essentiallystable from a cardiac standpoint. I aortic provided preoperative wrist evaluation , her surgery has been somewhat postponed. I do not feel that she requires any additional evaluation with another stress test, because she is in no active cardiac symptoms. She simply had one episode of postoperative A. fib that was documented.  Since then she's not had any further cardiac issues. Exertional dyspnea was evaluated in Myoview that was negative, and a normal echocardiogram.  My recommendation will be to proceed with any planned surgery with no additional cardiac she should be a low risk patient for a low risk surgery. As she has a history of Postoperative A. fib, I will have her adjust the timing of her beta blocker dose. She will take her metoprolol in the evening as opposed in the morning. This will allow her to be fully medicated for her morning operation.  I think her last episode may well have been exacerbated by holding her beta blocker perioperatively.  Postoperative atrial fibrillation See him plan for above. She is on a good dose of beta blocker no recurrence of A. fib outside that one episode. Change dosing interval to p.m. dosing as opposed to a.m. dosing to ensure coverage perioperatively.  Hypertension Well-controlled on current treatment.  Dyslipidemia Monitored by her PCP. She is intolerant of this about every type of medication to treat lipids. Simply because she really doesn't have that many active cardiac issues, I think we're limited to simply monitoring her levels intermittently. Hopefully he will once her arthritic symptoms are improved, she can be back to exercising and dietary modification to lose weight which would be the best treatment option for  her.   Orders Placed This Encounter  Procedures  . EKG 12-Lead   Followup: One year  Tyrica Afzal W. Herbie Baltimore, M.D., M.S. [facility] [address] [place], [place]  [address]  [phone] Pager # [phone]

## 2012-11-14 NOTE — Assessment & Plan Note (Signed)
Well controlled on current treatment.

## 2012-11-14 NOTE — Assessment & Plan Note (Signed)
Monitored by her PCP. She is intolerant of this about every type of medication to treat lipids. Simply because she really doesn't have that many active cardiac issues, I think we're limited to simply monitoring her levels intermittently. Hopefully he will once her arthritic symptoms are improved, she can be back to exercising and dietary modification to lose weight which would be the best treatment option for her.

## 2012-11-14 NOTE — Patient Instructions (Signed)
Your Blood Pressure & ECG look good.  I think that your are pretty stable overall from a heart perspective & should not need a Stress Test Pre-operatively.    Lets try taking your Metoprolol at night -- this may help with sleeping, but will help when it comes time for the operation, because it will be in your system in the morning for your surgery -- having a steady dose in your system will help prevent Atrial Fibrillation.    Good luck with your operation. Marykay Lex, MD  Your physician wants you to follow-up in: 12 months You will receive a reminder letter in the mail two months in advance. If you don't receive a letter, please call our office to schedule the follow-up appointment.

## 2012-11-14 NOTE — Assessment & Plan Note (Signed)
Deborah Mcmillan is essentiallystable from a cardiac standpoint. I aortic provided preoperative wrist evaluation , her surgery has been somewhat postponed. I do not feel that she requires any additional evaluation with another stress test, because she is in no active cardiac symptoms. She simply had one episode of postoperative A. fib that was documented. Since then she's not had any further cardiac issues. Exertional dyspnea was evaluated in Myoview that was negative, and a normal echocardiogram.  My recommendation will be to proceed with any planned surgery with no additional cardiac she should be a low risk patient for a low risk surgery. As she has a history of Postoperative A. fib, I will have her adjust the timing of her beta blocker dose. She will take her metoprolol in the evening as opposed in the morning. This will allow her to be fully medicated for her morning operation.  I think her last episode may well have been exacerbated by holding her beta blocker perioperatively.

## 2012-11-14 NOTE — Assessment & Plan Note (Signed)
See him plan for above. She is on a good dose of beta blocker no recurrence of A. fib outside that one episode. Change dosing interval to p.m. dosing as opposed to a.m. dosing to ensure coverage perioperatively.

## 2012-11-30 DIAGNOSIS — I10 Essential (primary) hypertension: Secondary | ICD-10-CM | POA: Diagnosis not present

## 2012-11-30 DIAGNOSIS — Z0000 Encounter for general adult medical examination without abnormal findings: Secondary | ICD-10-CM | POA: Diagnosis not present

## 2012-11-30 DIAGNOSIS — G2581 Restless legs syndrome: Secondary | ICD-10-CM | POA: Diagnosis not present

## 2012-11-30 DIAGNOSIS — M810 Age-related osteoporosis without current pathological fracture: Secondary | ICD-10-CM | POA: Diagnosis not present

## 2012-11-30 DIAGNOSIS — N390 Urinary tract infection, site not specified: Secondary | ICD-10-CM | POA: Diagnosis not present

## 2012-11-30 DIAGNOSIS — D649 Anemia, unspecified: Secondary | ICD-10-CM | POA: Diagnosis not present

## 2012-11-30 DIAGNOSIS — R351 Nocturia: Secondary | ICD-10-CM | POA: Diagnosis not present

## 2012-12-04 DIAGNOSIS — J019 Acute sinusitis, unspecified: Secondary | ICD-10-CM | POA: Diagnosis not present

## 2012-12-12 DIAGNOSIS — D72829 Elevated white blood cell count, unspecified: Secondary | ICD-10-CM | POA: Diagnosis not present

## 2012-12-12 DIAGNOSIS — R300 Dysuria: Secondary | ICD-10-CM | POA: Diagnosis not present

## 2012-12-12 DIAGNOSIS — Z79899 Other long term (current) drug therapy: Secondary | ICD-10-CM | POA: Diagnosis not present

## 2012-12-14 ENCOUNTER — Other Ambulatory Visit: Payer: Self-pay | Admitting: Obstetrics and Gynecology

## 2012-12-14 DIAGNOSIS — N644 Mastodynia: Secondary | ICD-10-CM

## 2013-01-04 DIAGNOSIS — R300 Dysuria: Secondary | ICD-10-CM | POA: Diagnosis not present

## 2013-01-08 ENCOUNTER — Other Ambulatory Visit: Payer: Self-pay | Admitting: Obstetrics and Gynecology

## 2013-01-08 ENCOUNTER — Ambulatory Visit
Admission: RE | Admit: 2013-01-08 | Discharge: 2013-01-08 | Disposition: A | Discharge status: Home / Self Care | Payer: Medicare Other | Source: Ambulatory Visit | Attending: Obstetrics and Gynecology | Admitting: Obstetrics and Gynecology

## 2013-01-08 DIAGNOSIS — R071 Chest pain on breathing: Secondary | ICD-10-CM | POA: Diagnosis not present

## 2013-01-08 DIAGNOSIS — N644 Mastodynia: Secondary | ICD-10-CM | POA: Diagnosis not present

## 2013-01-08 DIAGNOSIS — R52 Pain, unspecified: Secondary | ICD-10-CM

## 2013-01-09 DIAGNOSIS — N390 Urinary tract infection, site not specified: Secondary | ICD-10-CM | POA: Diagnosis not present

## 2013-01-16 ENCOUNTER — Encounter: Payer: Self-pay | Admitting: Cardiology

## 2013-01-30 ENCOUNTER — Other Ambulatory Visit: Payer: Self-pay | Admitting: Dermatology

## 2013-01-30 DIAGNOSIS — D239 Other benign neoplasm of skin, unspecified: Secondary | ICD-10-CM | POA: Diagnosis not present

## 2013-01-30 DIAGNOSIS — L821 Other seborrheic keratosis: Secondary | ICD-10-CM | POA: Diagnosis not present

## 2013-01-30 DIAGNOSIS — D485 Neoplasm of uncertain behavior of skin: Secondary | ICD-10-CM | POA: Diagnosis not present

## 2013-02-08 DIAGNOSIS — N390 Urinary tract infection, site not specified: Secondary | ICD-10-CM | POA: Diagnosis not present

## 2013-02-11 HISTORY — PX: SHOULDER SURGERY: SHX246

## 2013-02-12 DIAGNOSIS — M24119 Other articular cartilage disorders, unspecified shoulder: Secondary | ICD-10-CM | POA: Diagnosis not present

## 2013-02-12 DIAGNOSIS — M719 Bursopathy, unspecified: Secondary | ICD-10-CM | POA: Diagnosis not present

## 2013-02-12 DIAGNOSIS — S43439A Superior glenoid labrum lesion of unspecified shoulder, initial encounter: Secondary | ICD-10-CM | POA: Diagnosis not present

## 2013-02-12 DIAGNOSIS — M25819 Other specified joint disorders, unspecified shoulder: Secondary | ICD-10-CM | POA: Diagnosis not present

## 2013-02-12 DIAGNOSIS — M67919 Unspecified disorder of synovium and tendon, unspecified shoulder: Secondary | ICD-10-CM | POA: Diagnosis not present

## 2013-02-12 DIAGNOSIS — M19019 Primary osteoarthritis, unspecified shoulder: Secondary | ICD-10-CM | POA: Diagnosis not present

## 2013-02-12 DIAGNOSIS — S43429A Sprain of unspecified rotator cuff capsule, initial encounter: Secondary | ICD-10-CM | POA: Diagnosis not present

## 2013-02-12 DIAGNOSIS — M9420 Chondromalacia, unspecified site: Secondary | ICD-10-CM | POA: Diagnosis not present

## 2013-02-12 DIAGNOSIS — G8918 Other acute postprocedural pain: Secondary | ICD-10-CM | POA: Diagnosis not present

## 2013-02-15 DIAGNOSIS — M25519 Pain in unspecified shoulder: Secondary | ICD-10-CM | POA: Diagnosis not present

## 2013-02-19 DIAGNOSIS — M25519 Pain in unspecified shoulder: Secondary | ICD-10-CM | POA: Diagnosis not present

## 2013-02-21 ENCOUNTER — Other Ambulatory Visit: Payer: Self-pay | Admitting: Cardiology

## 2013-02-21 DIAGNOSIS — M25519 Pain in unspecified shoulder: Secondary | ICD-10-CM | POA: Diagnosis not present

## 2013-02-21 NOTE — Telephone Encounter (Signed)
Rx was sent to pharmacy electronically. 

## 2013-02-28 DIAGNOSIS — M25519 Pain in unspecified shoulder: Secondary | ICD-10-CM | POA: Diagnosis not present

## 2013-03-09 DIAGNOSIS — M25519 Pain in unspecified shoulder: Secondary | ICD-10-CM | POA: Diagnosis not present

## 2013-03-13 DIAGNOSIS — M25519 Pain in unspecified shoulder: Secondary | ICD-10-CM | POA: Diagnosis not present

## 2013-03-19 DIAGNOSIS — M25519 Pain in unspecified shoulder: Secondary | ICD-10-CM | POA: Diagnosis not present

## 2013-03-28 DIAGNOSIS — E789 Disorder of lipoprotein metabolism, unspecified: Secondary | ICD-10-CM | POA: Diagnosis not present

## 2013-04-03 DIAGNOSIS — M79609 Pain in unspecified limb: Secondary | ICD-10-CM | POA: Diagnosis not present

## 2013-04-03 DIAGNOSIS — M25519 Pain in unspecified shoulder: Secondary | ICD-10-CM | POA: Diagnosis not present

## 2013-04-03 DIAGNOSIS — E789 Disorder of lipoprotein metabolism, unspecified: Secondary | ICD-10-CM | POA: Diagnosis not present

## 2013-04-11 DIAGNOSIS — M25519 Pain in unspecified shoulder: Secondary | ICD-10-CM | POA: Diagnosis not present

## 2013-04-18 DIAGNOSIS — M25519 Pain in unspecified shoulder: Secondary | ICD-10-CM | POA: Diagnosis not present

## 2013-04-24 DIAGNOSIS — M25519 Pain in unspecified shoulder: Secondary | ICD-10-CM | POA: Diagnosis not present

## 2013-04-26 ENCOUNTER — Other Ambulatory Visit: Payer: Self-pay | Admitting: Dermatology

## 2013-04-26 DIAGNOSIS — D485 Neoplasm of uncertain behavior of skin: Secondary | ICD-10-CM | POA: Diagnosis not present

## 2013-05-02 DIAGNOSIS — M25519 Pain in unspecified shoulder: Secondary | ICD-10-CM | POA: Diagnosis not present

## 2013-05-09 DIAGNOSIS — R350 Frequency of micturition: Secondary | ICD-10-CM | POA: Diagnosis not present

## 2013-05-09 DIAGNOSIS — L299 Pruritus, unspecified: Secondary | ICD-10-CM | POA: Diagnosis not present

## 2013-05-09 DIAGNOSIS — N390 Urinary tract infection, site not specified: Secondary | ICD-10-CM | POA: Diagnosis not present

## 2013-05-09 DIAGNOSIS — J069 Acute upper respiratory infection, unspecified: Secondary | ICD-10-CM | POA: Diagnosis not present

## 2013-05-10 DIAGNOSIS — L0889 Other specified local infections of the skin and subcutaneous tissue: Secondary | ICD-10-CM | POA: Diagnosis not present

## 2013-05-17 DIAGNOSIS — Z4789 Encounter for other orthopedic aftercare: Secondary | ICD-10-CM | POA: Diagnosis not present

## 2013-05-24 DIAGNOSIS — L0889 Other specified local infections of the skin and subcutaneous tissue: Secondary | ICD-10-CM | POA: Diagnosis not present

## 2013-06-20 DIAGNOSIS — Z9889 Other specified postprocedural states: Secondary | ICD-10-CM | POA: Diagnosis not present

## 2013-06-20 DIAGNOSIS — G5620 Lesion of ulnar nerve, unspecified upper limb: Secondary | ICD-10-CM | POA: Diagnosis not present

## 2013-06-20 DIAGNOSIS — G5600 Carpal tunnel syndrome, unspecified upper limb: Secondary | ICD-10-CM | POA: Diagnosis not present

## 2013-07-25 DIAGNOSIS — G5600 Carpal tunnel syndrome, unspecified upper limb: Secondary | ICD-10-CM | POA: Diagnosis not present

## 2013-07-25 DIAGNOSIS — M19019 Primary osteoarthritis, unspecified shoulder: Secondary | ICD-10-CM | POA: Diagnosis not present

## 2013-08-27 DIAGNOSIS — M25519 Pain in unspecified shoulder: Secondary | ICD-10-CM | POA: Diagnosis not present

## 2013-08-27 DIAGNOSIS — M542 Cervicalgia: Secondary | ICD-10-CM | POA: Diagnosis not present

## 2013-09-03 DIAGNOSIS — G5600 Carpal tunnel syndrome, unspecified upper limb: Secondary | ICD-10-CM | POA: Diagnosis not present

## 2013-09-11 DIAGNOSIS — R209 Unspecified disturbances of skin sensation: Secondary | ICD-10-CM | POA: Diagnosis not present

## 2013-09-11 DIAGNOSIS — M25569 Pain in unspecified knee: Secondary | ICD-10-CM | POA: Diagnosis not present

## 2013-09-11 DIAGNOSIS — Z4789 Encounter for other orthopedic aftercare: Secondary | ICD-10-CM | POA: Diagnosis not present

## 2013-09-18 DIAGNOSIS — E789 Disorder of lipoprotein metabolism, unspecified: Secondary | ICD-10-CM | POA: Diagnosis not present

## 2013-09-27 DIAGNOSIS — E789 Disorder of lipoprotein metabolism, unspecified: Secondary | ICD-10-CM | POA: Diagnosis not present

## 2013-09-27 DIAGNOSIS — Z23 Encounter for immunization: Secondary | ICD-10-CM | POA: Diagnosis not present

## 2013-09-27 DIAGNOSIS — I10 Essential (primary) hypertension: Secondary | ICD-10-CM | POA: Diagnosis not present

## 2013-10-18 DIAGNOSIS — M25512 Pain in left shoulder: Secondary | ICD-10-CM | POA: Diagnosis not present

## 2013-10-18 DIAGNOSIS — Z4789 Encounter for other orthopedic aftercare: Secondary | ICD-10-CM | POA: Diagnosis not present

## 2013-11-02 ENCOUNTER — Other Ambulatory Visit: Payer: Self-pay | Admitting: Cardiology

## 2013-11-03 NOTE — Telephone Encounter (Signed)
Rx was sent to pharmacy electronically. 

## 2013-11-06 DIAGNOSIS — M25512 Pain in left shoulder: Secondary | ICD-10-CM | POA: Diagnosis not present

## 2013-11-06 DIAGNOSIS — J309 Allergic rhinitis, unspecified: Secondary | ICD-10-CM | POA: Diagnosis not present

## 2013-11-06 DIAGNOSIS — R252 Cramp and spasm: Secondary | ICD-10-CM | POA: Diagnosis not present

## 2013-11-06 DIAGNOSIS — G2581 Restless legs syndrome: Secondary | ICD-10-CM | POA: Diagnosis not present

## 2013-11-14 ENCOUNTER — Ambulatory Visit: Payer: Medicare Other | Admitting: Sports Medicine

## 2013-11-20 ENCOUNTER — Encounter: Payer: Self-pay | Admitting: Cardiology

## 2013-11-20 ENCOUNTER — Ambulatory Visit (INDEPENDENT_AMBULATORY_CARE_PROVIDER_SITE_OTHER): Payer: Medicare Other | Admitting: Cardiology

## 2013-11-20 VITALS — BP 140/68 | HR 63 | Ht 61.0 in | Wt 173.7 lb

## 2013-11-20 DIAGNOSIS — I480 Paroxysmal atrial fibrillation: Secondary | ICD-10-CM | POA: Diagnosis not present

## 2013-11-20 DIAGNOSIS — I10 Essential (primary) hypertension: Secondary | ICD-10-CM | POA: Diagnosis not present

## 2013-11-20 DIAGNOSIS — E785 Hyperlipidemia, unspecified: Secondary | ICD-10-CM | POA: Diagnosis not present

## 2013-11-20 NOTE — Patient Instructions (Signed)
No change in medications   Your physician wants you to follow-up in 12 months Dr Harding.  You will receive a reminder letter in the mail two months in advance. If you don't receive a letter, please call our office to schedule the follow-up appointment.  

## 2013-11-22 ENCOUNTER — Encounter: Payer: Self-pay | Admitting: Cardiology

## 2013-11-22 NOTE — Progress Notes (Signed)
PCP: Horatio Pel, MD  Clinic Note: Chief Complaint  Patient presents with  . Annual Exam    No CP, No SOB, No pressure, tightness or swelling    HPI: Deborah Mcmillan is a 79 y.o. female with a PMH below who presents today for annual followup of hypertension and dyslipidemia and a brief episode of postop A. fib.  Past Medical History  Diagnosis Date  . Hypertension   . Dyslipidemia   . Seasonal allergies   . Dysrhythmia     h/o atrial couplets with short PAT, h/o a-fib during surgery (event monitor)  . GERD (gastroesophageal reflux disease)   . Osteoporosis   . History of nuclear stress test 05/2009    dipyridamole; normal, low risk   . Postoperative atrial fibrillation  2011    Prior Cardiac Evaluation and Procedurcal History: Procedure Laterality Date  . Cosmetic surgery  2011    1 episode of atrial fibrillation during this   . Transthoracic echocardiogram  05/2009    EF=>55%, with normal LV systolic function, impaired LV relaxation; trace MR/TR; mild AV regurg  . Nm myoview ltd  May 2011    No ischemia or infarction.    Interval History: Deborah comes in today feeling quite well. She doesn't have any major complaints.  She denies any recurrent episodes of rapid or irregular heartbeats and rhythm. She does have intermittent palpitations and heart skipping, but denies any rapid rhythms. She stays relatively active but has not yet established a standing exercise routine. Her son recently moved in with her and she has been taking advantage of the having the dog to take on walks.  She just didn't really do long-distance walks because of her arthritis in her shoulders and hips. She denies any chest pain or shortness of breath with rest or exertion. No PND, orthopnea or edema. No lightheadedness, dizziness, weakness or syncope/near syncope, or TIA/amaurosis fugax symptoms. She had tried to start taking an aspirin a day, and we had to stop it because her GI upset.  ROS: A  comprehensive was performed. Review of Systems  Constitutional: Positive for weight loss.       Has been trying to increase her activity and watch her diet. He is happy with the weight loss noted, but thinks it she may have actually gained weight from last year initially and has lost more than was is currently indicated as only a 5 pound weight loss. She thinks she got up in the mid 180s.Marland Kitchen  HENT: Negative for nosebleeds.   Respiratory: Negative for cough, shortness of breath and wheezing.   Cardiovascular: Negative for claudication.  Gastrointestinal: Negative for blood in stool and melena.  Genitourinary: Negative for hematuria.  Musculoskeletal: Positive for back pain and joint pain. Negative for myalgias and falls.  Neurological: Negative.   Psychiatric/Behavioral: Negative for depression and memory loss.  All other systems reviewed and are negative.   Current Outpatient Prescriptions on File Prior to Visit  Medication Sig Dispense Refill  . albuterol (PROVENTIL) (2.5 MG/3ML) 0.083% nebulizer solution Take 2.5 mg by nebulization every 6 (six) hours as needed for wheezing.    . B Complex-C (SUPER B COMPLEX PO) Take by mouth.    . Calcium 150 MG TABS Take 1 tablet by mouth daily.    . Cholecalciferol (VITAMIN D) 2000 UNITS CAPS Take 1 capsule by mouth daily.    . fluticasone (FLONASE) 50 MCG/ACT nasal spray Place 1-2 sprays into the nose as needed.     Marland Kitchen  Magnesium 400 MG CAPS Take 400-800 mg by mouth daily.    . metoprolol succinate (TOPROL-XL) 100 MG 24 hr tablet Take 1 tablet (100 mg total) by mouth daily. 90 tablet 0  . Multiple Vitamin (MULTIVITAMIN) capsule Take 1 capsule by mouth daily.    . Omega-3 Fatty Acids (FISH OIL PO) Take by mouth daily.    Marland Kitchen omeprazole (PRILOSEC) 20 MG capsule Take 20 mg by mouth daily.    Marland Kitchen OVER THE COUNTER MEDICATION Q10 (qunol ) 200 mg a daily    . OVER THE COUNTER MEDICATION apple cider vinegar    . spironolactone (ALDACTONE) 25 MG tablet Take 25 mg  by mouth daily.    Marland Kitchen zolpidem (AMBIEN) 5 MG tablet Take 5 mg by mouth at bedtime as needed for sleep.     No current facility-administered medications on file prior to visit.   ALLERGIES REVIEWED IN EPIC -- no change SOCIAL AND FAMILY HISTORY REVIEWED IN EPIC -- no change  Wt Readings from Last 3 Encounters:  11/20/13 173 lb 11.2 oz (78.79 kg)  11/14/12 178 lb (80.74 kg)    PHYSICAL EXAM BP 140/68 mmHg  Pulse 63  Ht 5\' 1"  (1.549 m)  Wt 173 lb 11.2 oz (78.79 kg)  BMI 32.84 kg/m2 General appearance: alert, cooperative, appears stated age, no distress and mildly obese Neck: no adenopathy, no carotid bruit and no JVD Lungs: clear to auscultation bilaterally, normal percussion bilaterally and Nonlabored, and air movement Heart: regular rate and rhythm, S1, S2 normal, no murmur, click, rub or gallop and normal apical impulse Abdomen: soft, non-tender; bowel sounds normal; no masses, no organomegaly and Mildly obese Extremities: extremities normal, atraumatic, no cyanosis or edema, no edema, redness or tenderness in the calves or thighs and no ulcers, gangrene or trophic changes Pulses: 2+ and symmetric Neurologic: Alert and oriented X 3, normal strength and tone. Normal symmetric reflexes. Normal coordination and gait   Adult ECG Report  Rate: 63 ;  Rhythm: normal sinus rhythm and sinus arrhythmia  Narrative Interpretation: otherwise stable EKG  Recent Labs:  None available    ASSESSMENT / PLAN: Paroxysmal a-fib - only 1 episode documented, post-op As far as I can tell, she has not had any further recurrence of symptoms to suggest any additional features. With that in mind, I don't think she requires anticoagulation. She is on a good stable dose of Toprol and tolerating it well.  Essential hypertension As great control today as she is having the past.however she says that her knees are really bothering her lately and a rushing to get here she had some pain in her left knee walking  in.  At home her blood pressures usually run in the 120 to 130 range.  Since that the case, I would not add additional medication.  Dyslipidemia  She is on multiple different medications and unable to tolerate anything.her labs are being monitored by her PCP. She is currently trying to use red yeast rice. I also recommended coenzyme Q 10. At this point her goal LDL should be between 100-130.    No orders of the defined types were placed in this encounter.   Followup: one year   Leonie Man, M.D., M.S. Interventional Cardiologist   Pager # [phone]

## 2013-11-22 NOTE — Assessment & Plan Note (Signed)
As far as I can tell, she has not had any further recurrence of symptoms to suggest any additional features. With that in mind, I don't think she requires anticoagulation. She is on a good stable dose of Toprol and tolerating it well.

## 2013-11-22 NOTE — Assessment & Plan Note (Signed)
She is on multiple different medications and unable to tolerate anything.her labs are being monitored by her PCP. She is currently trying to use red yeast rice. I also recommended coenzyme Q 10. At this point her goal LDL should be between 100-130.

## 2013-11-22 NOTE — Assessment & Plan Note (Addendum)
As great control today as she is having the past.however she says that her knees are really bothering her lately and a rushing to get here she had some pain in her left knee walking in.  At home her blood pressures usually run in the 120 to 130 range.  Since that the case, I would not add additional medication.

## 2013-11-26 ENCOUNTER — Encounter: Payer: Self-pay | Admitting: Sports Medicine

## 2013-11-26 ENCOUNTER — Ambulatory Visit (INDEPENDENT_AMBULATORY_CARE_PROVIDER_SITE_OTHER): Payer: Medicare Other | Admitting: Sports Medicine

## 2013-11-26 VITALS — BP 155/67 | HR 61 | Ht 61.0 in | Wt 165.0 lb

## 2013-11-26 DIAGNOSIS — M25512 Pain in left shoulder: Secondary | ICD-10-CM | POA: Diagnosis not present

## 2013-11-26 NOTE — Progress Notes (Signed)
Subjective:    Patient ID: Deborah Mcmillan, female    DOB: 1934-03-28, 80 y.o.   MRN: [id number]  HPI: Pt is a 79yo right-hand-dominant patient who presents to clinic for left shoulder pain and reduced ROM; pt is here at the request of her PCP Dr. Shelia Media for second opinion about possible shoulder replacement surgery. Pt has had on-and-off left shoulder pain especially with lifting / reaching overhead for "years," and had a left shoulder arthroscopy with biceps tendodesis by Dr. Veverly Fells ([place]) in February of this year; she states at that time she was told she has rotator cuff pathology as well as shoulder arthritis and that she may ultimately need shoulder repair surgery. After the arthroscopy, she had essentially no improvement in surgery. She had 4-6 weeks of PT after surgery, which was stopped due to increased pain. She describes a sensation of pain "in different parts of her shoulder at different times," aggravated with movement, lifting objects, reaching over her head, shampooing her hair, etc. She also has a sensation of instability like her shoulder "might pop out." She has taken Tylenol and Aleve as well as used heat and ice, none of which has been helpful.  Pt saw Dr. Veverly Fells about 2 months ago and had a steroid injection, which helped for only 2 days. She had a repeat MRI which showed 3 cysts in her shoulder, and she states Dr. Veverly Fells again told her that her arthritis is "severe" and that she would need surgery to correct her arthritis. Since that time, she saw her PCP, who recommended this visit, as above.  EMR sections reviewed / updated with details as below. Of note, pt also sees Dr. 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  Family History  Problem Relation Age of Onset  . Cancer Father   . Cancer Brother     Past Medical History  Diagnosis  Date  . Hypertension   . Dyslipidemia   . Seasonal allergies   . Dysrhythmia     h/o atrial couplets with short PAT, h/o a-fib during surgery (event monitor)  . GERD (gastroesophageal reflux disease)   . Osteoporosis   . History of nuclear stress test 05/2009    dipyridamole; normal, low risk   . Postoperative atrial fibrillation  2011    Past Surgical History  Procedure Laterality Date  . Cosmetic surgery  2011    1 episode of atrial fibrillation during this   . Appendectomy  1961  . Hernia repair  1999    hiatal  . Bladder surgery  2002  . Lump removal  05/06/2009    [facility]  . Transthoracic echocardiogram  05/2009    EF=>55%, with normal LV systolic function, impaired LV relaxation; trace MR/TR; mild AV regurg  . Nm myoview ltd  May 2011    No ischemia or infarction.  . Shoulder surgery Left Feb 2015    Dr. Veverly Fells - arthroscopy (rotator cuff ?debridement plus biceps tenodesis)    History   Social History  . Marital Status: Widowed    Spouse Name: N/A    Number of Children: 3  . Years of Education: 12+   Occupational History  . 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 Smoking status: Never Smoker   . Smokeless  tobacco: Never Used  . Alcohol Use: No  . Drug Use: No  . Sexual Activity: Not on file   Other Topics Concern  . Not on file   Social History Narrative   Widowed mother of 3. Grandmother of 5 and great-grandmother 8.    Does not smoke. Does not drink alcohol.   Does not get routine exercise, do to arthritis pains.   I have also reviewed and updated the pt's allergies and current medications as appropriate.  Review of Systems: As above. Otherwise, full 12-system ROS was reviewed and all negative.      Objective:   Physical Exam BP 155/67 mmHg  Pulse 61  Ht 5\' 1"  (1.549 m)  Wt 165 lb (74.844 kg)  BMI 31.19 kg/m2 Gen: well-appearing elderly adult female in NAD  MSK: bilateral shoulders and cervical spine  normal to inspection with well-healed arthroscopy scars present on left  Marked diffuse left shoulder tenderness to palpation over bony prominences as well as scapular musculature  Unresisted passive ROM generally not limited but pt does have painful arc even passively  Markedly limited active and resisted passive ROM on the left, worst with external rotation and abduction above 90 degrees  Positive Neer impingement test, empty can test, and Hawkins test on the left  Right shoulder normal in comparison  Neurovascular: alert, oriented, sensation grossly intact other than reduced in 4th and 5th digits of left hand (baseline per pt)  Strength 4+/5 in left upper extremities secondary to pain  Distal pulses intact / symmetric bilaterally in UE     Assessment & Plan:  79yo female with left shoulder pain, likely mixed osteoarthritis and rotator cuff pathology - pt requested to bring CD of imaging (MRI, EMG, etc) from [facility] form signed for [facility] as well (office notes, recommendations, etc) - discussed various aspects of pt's situation and advised that if arthritis is as severe as it appears, surgery would be the definitive "fix" - Dr. Micheline Chapman to discuss again with pt after reviewing imaging and records from ortho, with further recs forthcoming - anticipate recommendation will be to re-discuss with Dr. Veverly Fells re: surgery if pt decides surgery is something she wants to pursue - f/u as needed, otherwise  Emmaline Kluver, MD PGY-3, [facility] 11/26/2013, 5:06 PM   Patient was seen and evaluated by myself along with Dr. Venetia Maxon. I agree with the above findings and plan.

## 2013-11-28 ENCOUNTER — Telehealth: Payer: Self-pay | Admitting: Sports Medicine

## 2013-11-28 ENCOUNTER — Encounter: Payer: Self-pay | Admitting: Sports Medicine

## 2013-11-28 NOTE — Telephone Encounter (Signed)
I spoke with the patient on the phone today after reviewing the MRI scan of her left shoulder done recently at [facility]. The MRI does show advanced glenohumeral arthrosis with prominent subchondral cysts in the glenoid along with extensive degeneration of the glenoid labrum. Only some mild diffuse supraspinatus and infraspinatus tendinosis. Date of the MRI is 08/27/2013. Based on these findings as well as her clinical exam I do think that the definitive treatment for her problem is a total shoulder arthroplasty. I recommend that she return to Dr. Veverly Fells to discuss this further. I've also given her the names of Dr. Mardelle Matte and Dr. Tamera Punt if she would like a second surgical opinion.  She is also asking about her knee pain. X-rays of her knee show only mild degenerative changes. She had a cortisone injection a month which did not provide her with much relief. Based on her x-ray findings I recommend a second cortisone injection followed by Visco supplementation if she continues to struggle. She certainly does not need a total knee arthroplasty. She can discuss this further with Dr.Norris as well as. I will remain available to see her as needed.

## 2013-12-17 ENCOUNTER — Other Ambulatory Visit: Payer: Self-pay

## 2013-12-17 DIAGNOSIS — Z1231 Encounter for screening mammogram for malignant neoplasm of breast: Secondary | ICD-10-CM

## 2013-12-20 DIAGNOSIS — G2581 Restless legs syndrome: Secondary | ICD-10-CM | POA: Diagnosis not present

## 2013-12-20 DIAGNOSIS — E789 Disorder of lipoprotein metabolism, unspecified: Secondary | ICD-10-CM | POA: Diagnosis not present

## 2013-12-20 DIAGNOSIS — M25512 Pain in left shoulder: Secondary | ICD-10-CM | POA: Diagnosis not present

## 2013-12-25 DIAGNOSIS — M25562 Pain in left knee: Secondary | ICD-10-CM | POA: Diagnosis not present

## 2013-12-31 DIAGNOSIS — E789 Disorder of lipoprotein metabolism, unspecified: Secondary | ICD-10-CM | POA: Diagnosis not present

## 2014-01-09 ENCOUNTER — Ambulatory Visit: Payer: Medicare Other

## 2014-01-09 DIAGNOSIS — M25562 Pain in left knee: Secondary | ICD-10-CM | POA: Diagnosis not present

## 2014-01-15 ENCOUNTER — Other Ambulatory Visit: Payer: Self-pay | Admitting: Obstetrics and Gynecology

## 2014-01-15 DIAGNOSIS — N958 Other specified menopausal and perimenopausal disorders: Secondary | ICD-10-CM | POA: Diagnosis not present

## 2014-01-15 DIAGNOSIS — M8588 Other specified disorders of bone density and structure, other site: Secondary | ICD-10-CM | POA: Diagnosis not present

## 2014-01-15 DIAGNOSIS — Z124 Encounter for screening for malignant neoplasm of cervix: Secondary | ICD-10-CM | POA: Diagnosis not present

## 2014-01-16 DIAGNOSIS — M25562 Pain in left knee: Secondary | ICD-10-CM | POA: Diagnosis not present

## 2014-01-16 LAB — CYTOLOGY - PAP

## 2014-01-18 ENCOUNTER — Telehealth: Payer: Self-pay | Admitting: *Deleted

## 2014-01-18 NOTE — Telephone Encounter (Signed)
CLEARANCE FOR LEFT KNEE SCOPE --CLEARED FOR SURGERY PER DR HARDING NO RECOMMENDATIONS

## 2014-01-21 DIAGNOSIS — M8580 Other specified disorders of bone density and structure, unspecified site: Secondary | ICD-10-CM | POA: Diagnosis not present

## 2014-01-31 ENCOUNTER — Other Ambulatory Visit: Payer: Self-pay | Admitting: Cardiology

## 2014-01-31 DIAGNOSIS — D225 Melanocytic nevi of trunk: Secondary | ICD-10-CM | POA: Diagnosis not present

## 2014-01-31 DIAGNOSIS — Z808 Family history of malignant neoplasm of other organs or systems: Secondary | ICD-10-CM | POA: Diagnosis not present

## 2014-01-31 DIAGNOSIS — Z86018 Personal history of other benign neoplasm: Secondary | ICD-10-CM | POA: Diagnosis not present

## 2014-01-31 DIAGNOSIS — L821 Other seborrheic keratosis: Secondary | ICD-10-CM | POA: Diagnosis not present

## 2014-01-31 NOTE — Telephone Encounter (Signed)
Rx has been sent to the pharmacy electronically. ° °

## 2014-02-04 ENCOUNTER — Ambulatory Visit: Payer: Medicare Other

## 2014-02-05 ENCOUNTER — Other Ambulatory Visit: Payer: Self-pay | Admitting: Cardiology

## 2014-02-05 DIAGNOSIS — M8588 Other specified disorders of bone density and structure, other site: Secondary | ICD-10-CM | POA: Diagnosis not present

## 2014-02-05 NOTE — Telephone Encounter (Signed)
Metoprolol succinate refilled #90 with 2 on 01/31/14

## 2014-02-12 ENCOUNTER — Ambulatory Visit
Admission: RE | Admit: 2014-02-12 | Discharge: 2014-02-12 | Disposition: A | Discharge status: Home / Self Care | Payer: Medicare Other | Source: Ambulatory Visit

## 2014-02-12 DIAGNOSIS — Z1231 Encounter for screening mammogram for malignant neoplasm of breast: Secondary | ICD-10-CM

## 2014-03-06 DIAGNOSIS — R221 Localized swelling, mass and lump, neck: Secondary | ICD-10-CM | POA: Diagnosis not present

## 2014-03-06 DIAGNOSIS — J0110 Acute frontal sinusitis, unspecified: Secondary | ICD-10-CM | POA: Diagnosis not present

## 2014-03-18 DIAGNOSIS — M810 Age-related osteoporosis without current pathological fracture: Secondary | ICD-10-CM | POA: Diagnosis not present

## 2014-03-18 DIAGNOSIS — I10 Essential (primary) hypertension: Secondary | ICD-10-CM | POA: Diagnosis not present

## 2014-03-18 DIAGNOSIS — E041 Nontoxic single thyroid nodule: Secondary | ICD-10-CM | POA: Diagnosis not present

## 2014-03-18 DIAGNOSIS — D126 Benign neoplasm of colon, unspecified: Secondary | ICD-10-CM | POA: Diagnosis not present

## 2014-03-18 DIAGNOSIS — Z1389 Encounter for screening for other disorder: Secondary | ICD-10-CM | POA: Diagnosis not present

## 2014-03-18 DIAGNOSIS — Z6832 Body mass index (BMI) 32.0-32.9, adult: Secondary | ICD-10-CM | POA: Diagnosis not present

## 2014-03-18 DIAGNOSIS — I779 Disorder of arteries and arterioles, unspecified: Secondary | ICD-10-CM | POA: Diagnosis not present

## 2014-03-26 DIAGNOSIS — J329 Chronic sinusitis, unspecified: Secondary | ICD-10-CM | POA: Diagnosis not present

## 2014-03-26 DIAGNOSIS — E789 Disorder of lipoprotein metabolism, unspecified: Secondary | ICD-10-CM | POA: Diagnosis not present

## 2014-03-26 DIAGNOSIS — R5383 Other fatigue: Secondary | ICD-10-CM | POA: Diagnosis not present

## 2014-03-26 DIAGNOSIS — I10 Essential (primary) hypertension: Secondary | ICD-10-CM | POA: Diagnosis not present

## 2014-04-01 DIAGNOSIS — I10 Essential (primary) hypertension: Secondary | ICD-10-CM | POA: Diagnosis not present

## 2014-04-01 DIAGNOSIS — E789 Disorder of lipoprotein metabolism, unspecified: Secondary | ICD-10-CM | POA: Diagnosis not present

## 2014-04-02 DIAGNOSIS — Z800 Family history of malignant neoplasm of digestive organs: Secondary | ICD-10-CM | POA: Diagnosis not present

## 2014-04-02 DIAGNOSIS — Z86010 Personal history of colonic polyps: Secondary | ICD-10-CM | POA: Diagnosis not present

## 2014-04-02 DIAGNOSIS — R1314 Dysphagia, pharyngoesophageal phase: Secondary | ICD-10-CM | POA: Diagnosis not present

## 2014-04-25 DIAGNOSIS — Z86010 Personal history of colonic polyps: Secondary | ICD-10-CM | POA: Diagnosis not present

## 2014-04-25 DIAGNOSIS — Z800 Family history of malignant neoplasm of digestive organs: Secondary | ICD-10-CM | POA: Diagnosis not present

## 2014-04-25 DIAGNOSIS — Z9889 Other specified postprocedural states: Secondary | ICD-10-CM | POA: Diagnosis not present

## 2014-04-25 DIAGNOSIS — R1314 Dysphagia, pharyngoesophageal phase: Secondary | ICD-10-CM | POA: Diagnosis not present

## 2014-04-25 DIAGNOSIS — Z09 Encounter for follow-up examination after completed treatment for conditions other than malignant neoplasm: Secondary | ICD-10-CM | POA: Diagnosis not present

## 2014-04-25 DIAGNOSIS — K5730 Diverticulosis of large intestine without perforation or abscess without bleeding: Secondary | ICD-10-CM | POA: Diagnosis not present

## 2014-04-30 DIAGNOSIS — Z6832 Body mass index (BMI) 32.0-32.9, adult: Secondary | ICD-10-CM | POA: Diagnosis not present

## 2014-04-30 DIAGNOSIS — M810 Age-related osteoporosis without current pathological fracture: Secondary | ICD-10-CM | POA: Diagnosis not present

## 2014-05-02 DIAGNOSIS — M1712 Unilateral primary osteoarthritis, left knee: Secondary | ICD-10-CM | POA: Diagnosis not present

## 2014-05-02 DIAGNOSIS — M7072 Other bursitis of hip, left hip: Secondary | ICD-10-CM | POA: Diagnosis not present

## 2014-05-02 DIAGNOSIS — S83242A Other tear of medial meniscus, current injury, left knee, initial encounter: Secondary | ICD-10-CM | POA: Diagnosis not present

## 2014-05-02 DIAGNOSIS — M25512 Pain in left shoulder: Secondary | ICD-10-CM | POA: Diagnosis not present

## 2014-05-13 DIAGNOSIS — K118 Other diseases of salivary glands: Secondary | ICD-10-CM | POA: Diagnosis not present

## 2014-05-14 ENCOUNTER — Other Ambulatory Visit: Payer: Self-pay | Admitting: Otolaryngology

## 2014-05-14 DIAGNOSIS — Z789 Other specified health status: Secondary | ICD-10-CM

## 2014-05-16 DIAGNOSIS — M7072 Other bursitis of hip, left hip: Secondary | ICD-10-CM | POA: Diagnosis not present

## 2014-05-16 DIAGNOSIS — M25512 Pain in left shoulder: Secondary | ICD-10-CM | POA: Diagnosis not present

## 2014-05-16 DIAGNOSIS — S83242D Other tear of medial meniscus, current injury, left knee, subsequent encounter: Secondary | ICD-10-CM | POA: Diagnosis not present

## 2014-05-17 ENCOUNTER — Other Ambulatory Visit: Payer: Self-pay | Admitting: Otolaryngology

## 2014-05-17 DIAGNOSIS — K118 Other diseases of salivary glands: Secondary | ICD-10-CM

## 2014-05-22 DIAGNOSIS — M1712 Unilateral primary osteoarthritis, left knee: Secondary | ICD-10-CM | POA: Diagnosis not present

## 2014-05-23 ENCOUNTER — Inpatient Hospital Stay: Admission: RE | Admit: 2014-05-23 | Payer: Medicare Other | Source: Ambulatory Visit

## 2014-05-29 DIAGNOSIS — M1712 Unilateral primary osteoarthritis, left knee: Secondary | ICD-10-CM | POA: Diagnosis not present

## 2014-05-30 ENCOUNTER — Ambulatory Visit
Admission: RE | Admit: 2014-05-30 | Discharge: 2014-05-30 | Disposition: A | Discharge status: Home / Self Care | Payer: Medicare Other | Source: Ambulatory Visit | Attending: Otolaryngology | Admitting: Otolaryngology

## 2014-05-30 DIAGNOSIS — K118 Other diseases of salivary glands: Secondary | ICD-10-CM

## 2014-05-30 MED ORDER — IOHEXOL 300 MG/ML  SOLN
75.0000 mL | Freq: Once | INTRAMUSCULAR | Status: AC | PRN
  Administered 2014-05-30: 75 mL via INTRAVENOUS

## 2014-06-03 ENCOUNTER — Encounter (HOSPITAL_COMMUNITY): Payer: Self-pay

## 2014-06-03 ENCOUNTER — Ambulatory Visit (HOSPITAL_COMMUNITY)
Admission: RE | Admit: 2014-06-03 | Discharge: 2014-06-03 | Disposition: A | Discharge status: Home / Self Care | Payer: Medicare Other | Source: Ambulatory Visit | Attending: Endocrinology | Admitting: Endocrinology

## 2014-06-03 DIAGNOSIS — M810 Age-related osteoporosis without current pathological fracture: Secondary | ICD-10-CM | POA: Insufficient documentation

## 2014-06-03 DIAGNOSIS — Z5181 Encounter for therapeutic drug level monitoring: Secondary | ICD-10-CM | POA: Insufficient documentation

## 2014-06-03 MED ORDER — DENOSUMAB 60 MG/ML ~~LOC~~ SOLN
60.0000 mg | Freq: Once | SUBCUTANEOUS | Status: AC
  Administered 2014-06-03: 60 mg via SUBCUTANEOUS
  Filled 2014-06-03: qty 1

## 2014-06-03 NOTE — Discharge Instructions (Signed)
Denosumab injection What is this medicine? DENOSUMAB (den oh sue mab) slows bone breakdown. Prolia is used to treat osteoporosis in women after menopause and in men. Xgeva is used to prevent bone fractures and other bone problems caused by cancer bone metastases. Xgeva is also used to treat giant cell tumor of the bone. This medicine may be used for other purposes; ask your health care provider or pharmacist if you have questions. COMMON BRAND NAME(S): Prolia, XGEVA What should I tell my health care provider before I take this medicine? They need to know if you have any of these conditions: -dental disease -eczema -infection or history of infections -kidney disease or on dialysis -low blood calcium or vitamin D -malabsorption syndrome -scheduled to have surgery or tooth extraction -taking medicine that contains denosumab -thyroid or parathyroid disease -an unusual reaction to denosumab, other medicines, foods, dyes, or preservatives -pregnant or trying to get pregnant -breast-feeding How should I use this medicine? This medicine is for injection under the skin. It is given by a health care professional in a hospital or clinic setting. If you are getting Prolia, a special MedGuide will be given to you by the pharmacist with each prescription and refill. Be sure to read this information carefully each time. For Prolia, talk to your pediatrician regarding the use of this medicine in children. Special care may be needed. For Xgeva, talk to your pediatrician regarding the use of this medicine in children. While this drug may be prescribed for children as young as 13 years for selected conditions, precautions do apply. Overdosage: If you think you've taken too much of this medicine contact a poison control center or emergency room at once. Overdosage: If you think you have taken too much of this medicine contact a poison control center or emergency room at once. NOTE: This medicine is only for  you. Do not share this medicine with others. What if I miss a dose? It is important not to miss your dose. Call your doctor or health care professional if you are unable to keep an appointment. What may interact with this medicine? Do not take this medicine with any of the following medications: -other medicines containing denosumab This medicine may also interact with the following medications: -medicines that suppress the immune system -medicines that treat cancer -steroid medicines like prednisone or cortisone This list may not describe all possible interactions. Give your health care provider a list of all the medicines, herbs, non-prescription drugs, or dietary supplements you use. Also tell them if you smoke, drink alcohol, or use illegal drugs. Some items may interact with your medicine. What should I watch for while using this medicine? Visit your doctor or health care professional for regular checks on your progress. Your doctor or health care professional may order blood tests and other tests to see how you are doing. Call your doctor or health care professional if you get a cold or other infection while receiving this medicine. Do not treat yourself. This medicine may decrease your body's ability to fight infection. You should make sure you get enough calcium and vitamin D while you are taking this medicine, unless your doctor tells you not to. Discuss the foods you eat and the vitamins you take with your health care professional. See your dentist regularly. Brush and floss your teeth as directed. Before you have any dental work done, tell your dentist you are receiving this medicine. Do not become pregnant while taking this medicine or for 5 months after stopping   it. Women should inform their doctor if they wish to become pregnant or think they might be pregnant. There is a potential for serious side effects to an unborn child. Talk to your health care professional or pharmacist for more  information. What side effects may I notice from receiving this medicine? Side effects that you should report to your doctor or health care professional as soon as possible: -allergic reactions like skin rash, itching or hives, swelling of the face, lips, or tongue -breathing problems -chest pain -fast, irregular heartbeat -feeling faint or lightheaded, falls -fever, chills, or any other sign of infection -muscle spasms, tightening, or twitches -numbness or tingling -skin blisters or bumps, or is dry, peels, or red -slow healing or unexplained pain in the mouth or jaw -unusual bleeding or bruising Side effects that usually do not require medical attention (Report these to your doctor or health care professional if they continue or are bothersome.): -muscle pain -stomach upset, gas This list may not describe all possible side effects. Call your doctor for medical advice about side effects. You may report side effects to FDA at 1-800-FDA-1088. Where should I keep my medicine? This medicine is only given in a clinic, doctor's office, or other health care setting and will not be stored at home. NOTE: This sheet is a summary. It may not cover all possible information. If you have questions about this medicine, talk to your doctor, pharmacist, or health care provider.  2015, Elsevier/Gold Standard. (2011-06-28 12:37:47)  

## 2014-06-03 NOTE — BH Specialist Note (Signed)
Pt confirms she is currently taking Vitamin D and calcium.  Pt received her prolia injection today.  D/c instructions given on prolia.  Next appointment given to pt for 12/10/14.

## 2014-06-05 DIAGNOSIS — M1712 Unilateral primary osteoarthritis, left knee: Secondary | ICD-10-CM | POA: Diagnosis not present

## 2014-06-24 DIAGNOSIS — K111 Hypertrophy of salivary gland: Secondary | ICD-10-CM | POA: Diagnosis not present

## 2014-07-24 DIAGNOSIS — M1712 Unilateral primary osteoarthritis, left knee: Secondary | ICD-10-CM | POA: Diagnosis not present

## 2014-07-24 DIAGNOSIS — M7072 Other bursitis of hip, left hip: Secondary | ICD-10-CM | POA: Diagnosis not present

## 2014-07-29 DIAGNOSIS — E789 Disorder of lipoprotein metabolism, unspecified: Secondary | ICD-10-CM | POA: Diagnosis not present

## 2014-08-01 DIAGNOSIS — E789 Disorder of lipoprotein metabolism, unspecified: Secondary | ICD-10-CM | POA: Diagnosis not present

## 2014-08-01 DIAGNOSIS — F5101 Primary insomnia: Secondary | ICD-10-CM | POA: Diagnosis not present

## 2014-08-01 DIAGNOSIS — I10 Essential (primary) hypertension: Secondary | ICD-10-CM | POA: Diagnosis not present

## 2014-09-04 DIAGNOSIS — G2581 Restless legs syndrome: Secondary | ICD-10-CM | POA: Diagnosis not present

## 2014-09-04 DIAGNOSIS — J069 Acute upper respiratory infection, unspecified: Secondary | ICD-10-CM | POA: Diagnosis not present

## 2014-09-04 DIAGNOSIS — Z23 Encounter for immunization: Secondary | ICD-10-CM | POA: Diagnosis not present

## 2014-09-04 DIAGNOSIS — J309 Allergic rhinitis, unspecified: Secondary | ICD-10-CM | POA: Diagnosis not present

## 2014-09-17 ENCOUNTER — Telehealth: Payer: Self-pay | Admitting: Cardiology

## 2014-09-19 DIAGNOSIS — R220 Localized swelling, mass and lump, head: Secondary | ICD-10-CM | POA: Diagnosis not present

## 2014-09-19 DIAGNOSIS — J329 Chronic sinusitis, unspecified: Secondary | ICD-10-CM | POA: Diagnosis not present

## 2014-09-19 DIAGNOSIS — J069 Acute upper respiratory infection, unspecified: Secondary | ICD-10-CM | POA: Diagnosis not present

## 2014-09-19 NOTE — Telephone Encounter (Signed)
Close encounter 

## 2014-09-26 DIAGNOSIS — M810 Age-related osteoporosis without current pathological fracture: Secondary | ICD-10-CM | POA: Diagnosis not present

## 2014-09-26 DIAGNOSIS — Z6832 Body mass index (BMI) 32.0-32.9, adult: Secondary | ICD-10-CM | POA: Diagnosis not present

## 2014-09-26 DIAGNOSIS — Z79899 Other long term (current) drug therapy: Secondary | ICD-10-CM | POA: Diagnosis not present

## 2014-10-10 DIAGNOSIS — M7072 Other bursitis of hip, left hip: Secondary | ICD-10-CM | POA: Diagnosis not present

## 2014-10-10 DIAGNOSIS — M19012 Primary osteoarthritis, left shoulder: Secondary | ICD-10-CM | POA: Diagnosis not present

## 2014-10-15 ENCOUNTER — Other Ambulatory Visit: Payer: Self-pay | Admitting: Cardiology

## 2014-10-15 NOTE — Telephone Encounter (Signed)
Rx(s) sent to pharmacy electronically.  

## 2014-10-31 DIAGNOSIS — N390 Urinary tract infection, site not specified: Secondary | ICD-10-CM | POA: Diagnosis not present

## 2014-12-02 ENCOUNTER — Ambulatory Visit (INDEPENDENT_AMBULATORY_CARE_PROVIDER_SITE_OTHER): Payer: Medicare Other | Admitting: Cardiology

## 2014-12-02 VITALS — BP 136/62 | HR 73 | Ht 61.0 in | Wt 174.1 lb

## 2014-12-02 DIAGNOSIS — I10 Essential (primary) hypertension: Secondary | ICD-10-CM | POA: Diagnosis not present

## 2014-12-02 DIAGNOSIS — I480 Paroxysmal atrial fibrillation: Secondary | ICD-10-CM | POA: Diagnosis not present

## 2014-12-02 DIAGNOSIS — Z01810 Encounter for preprocedural cardiovascular examination: Secondary | ICD-10-CM | POA: Diagnosis not present

## 2014-12-02 DIAGNOSIS — I4891 Unspecified atrial fibrillation: Secondary | ICD-10-CM

## 2014-12-02 DIAGNOSIS — I9789 Other postprocedural complications and disorders of the circulatory system, not elsewhere classified: Secondary | ICD-10-CM | POA: Diagnosis not present

## 2014-12-02 DIAGNOSIS — E785 Hyperlipidemia, unspecified: Secondary | ICD-10-CM | POA: Diagnosis not present

## 2014-12-02 NOTE — Progress Notes (Signed)
PCP: Horatio Pel, MD  Clinic Note: Chief Complaint  Patient presents with  . Annual Exam    no chest pain no light headedness or dizziness   . Shortness of Breath    pt states every once and a while she do get SOB going up steps  . Edema    pt states that when she was in [place] for abot a month and a half her feet and ankles had edema but seem to be ok now since coming back home she states that she also experienced a lot of leg cramps at night    HPI: Deborah Mcmillan is a 80 y.o. female with a PMH below who presents today for annual f/u of PAF (was post-op, but no documented recurrence)  Deborah Mcmillan was last seen in Nov 2015 - was doing ok.  Recent Hospitalizations: none - currently being evaluated for possible shoulder replacement surgery.  Studies Reviewed: None  Interval History: Deborah presents today really without complaints. As she noted while before, she had a rough they are all in [place]. Noticing swelling during the trip, and was really limited by discomfort and pain in her feet with walking due to the swelling. She also noted more exertional dyspnea there than usual. However once she returned home, has been doing relatively well. With the swelling in her feet showed cramps. She thinks it bothers him much in the left excised with the heat.  Otherwise at home, she is able to walk up 2-3 flights of stairs without significant dyspnea or chest pain. She states it she gets short of breath every once in a while do activities but not limiting. Can go up 2-3 flights of stairs without SOB or chest pain. But does get sob every once & a while - a bit more than prior. Ankles swollen while in [place] & better here - leg cramps.  Was tired related to heat -- not wanting to walk.  No chest pain or shortness of breath with rest or exertion.  No PND, orthopnea - to go along with her edema while in [place].  No palpitations, lightheadedness, dizziness, weakness or  syncope/near syncope. No TIA/amaurosis fugax symptoms.  ROS: A comprehensive was performed. Review of Systems  Respiratory: Positive for shortness of breath (Exertional dyspnea, going up inclines rapidly).   Cardiovascular: Positive for leg swelling. Negative for claudication.       Per history of present illness  Gastrointestinal: Positive for heartburn. Negative for blood in stool and melena.  Musculoskeletal: Positive for joint pain (Knees). Negative for falls.       Leg cramps  Neurological: Positive for dizziness (Some positional dizziness).  Endo/Heme/Allergies: Does not bruise/bleed easily.  Psychiatric/Behavioral: Negative for depression. The patient is nervous/anxious.   All other systems reviewed and are negative.    Past Medical History  Diagnosis Date  . Hypertension   . Dyslipidemia   . Seasonal allergies   . Dysrhythmia     h/o atrial couplets with short PAT, h/o a-fib during surgery (event monitor)  . GERD (gastroesophageal reflux disease)   . Osteoporosis   . History of nuclear stress test 05/2009    dipyridamole; normal, low risk   . Postoperative atrial fibrillation ([place])  2011    Past Surgical History  Procedure Laterality Date  . Cosmetic surgery  2011    1 episode of atrial fibrillation during this   . Appendectomy  1961  . Hernia repair  1999    hiatal  .  Bladder surgery  2002  . Lump removal  05/06/2009    [facility]  . Transthoracic echocardiogram  05/2009    EF=>55%, with normal LV systolic function, impaired LV relaxation; trace MR/TR; mild AV regurg  . Nm myoview ltd  May 2011    No ischemia or infarction.  . Shoulder surgery Left Feb 2015    Dr. Veverly Fells - arthroscopy (rotator cuff ?debridement plus biceps tenodesis)   Prior to Admission medications   Medication Sig Start Date End Date Taking? Authorizing Provider  albuterol (PROVENTIL) (2.5 MG/3ML) 0.083% nebulizer solution Take 2.5 mg by nebulization every 6 (six) hours as needed for wheezing.    Yes Historical Provider, MD  B Complex-C (SUPER B COMPLEX PO) Take by mouth.   Yes Historical Provider, MD  Calcium 150 MG TABS Take 1 tablet by mouth daily.   Yes Historical Provider, MD  Cholecalciferol (VITAMIN D) 2000 UNITS CAPS Take 1 capsule by mouth daily.   Yes Historical Provider, MD  fluticasone (FLONASE) 50 MCG/ACT nasal spray Place 1-2 sprays into the nose as needed.    Yes Historical Provider, MD  ipratropium (ATROVENT) 0.06 % nasal spray Place 1 spray into the nose as needed. 09/04/14  Yes Historical Provider, MD  Magnesium 400 MG CAPS Take 400-800 mg by mouth daily.   Yes Historical Provider, MD  metoprolol succinate (TOPROL-XL) 100 MG 24 hr tablet TAKE 1 TABLET (100 MG TOTAL) BY MOUTH DAILY. 10/15/14  Yes Leonie Man, MD  mometasone (NASONEX) 50 MCG/ACT nasal spray Place 2 sprays into the nose as needed. 10/20/14  Yes Historical Provider, MD  Multiple Vitamin (MULTIVITAMIN) capsule Take 1 capsule by mouth daily.   Yes Historical Provider, MD  Omega-3 Fatty Acids (FISH OIL PO) Take by mouth daily.   Yes Historical Provider, MD  omeprazole (PRILOSEC) 20 MG capsule Take 20 mg by mouth daily.   Yes Historical Provider, MD  OVER THE COUNTER MEDICATION Q10 (qunol ) 200 mg a daily   Yes Historical Provider, MD  pramipexole (MIRAPEX) 0.125 MG tablet Take 1 tablet by mouth 2 (two) times daily. For 30 days take 1 tablet at bedtime, then start twice a day 11/06/13  Yes Historical Provider, MD  spironolactone (ALDACTONE) 25 MG tablet Take 25 mg by mouth daily.   Yes Historical Provider, MD  zolpidem (AMBIEN) 5 MG tablet Take 5 mg by mouth at bedtime as needed for sleep.   Yes Historical Provider, MD   Allergies  Allergen Reactions  . Codeine     Pain & upset stomach  . Crestor [Rosuvastatin]     Statin intolerance  . Lodine [Etodolac]     Upset stomach  . Penicillins   . Sulfa Antibiotics     Pain & upset stomach  . Welchol [Colesevelam] Nausea Only and Other (See Comments)     Stomach did not feel good  . Zetia [Ezetimibe]   . Niacin And Related Rash    "on fire"  . Tetracyclines & Related Rash    Upset stomach     Social History   Social History  . Marital Status: Widowed    Spouse Name: N/A  . Number of Children: 3  . Years of Education: 12+   Occupational History  . 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 Smoking status: Never Smoker   . Smokeless tobacco: Never Used  . Alcohol Use: No  . Drug Use: No  .  Sexual Activity: Not Asked   Other Topics Concern  . None   Social History Narrative   Widowed mother of 3. Grandmother of 5 and great-grandmother 8.    Does not smoke. Does not drink alcohol.   Does not get routine exercise, do to arthritis pains.   Family History  Problem Relation Age of Onset  . Cancer Father   . Cancer Brother      Wt Readings from Last 3 Encounters:  12/02/14 174 lb 1.6 oz (78.971 kg)  06/03/14 164 lb (74.39 kg)  11/26/13 165 lb (74.844 kg)    PHYSICAL EXAM BP 136/62 mmHg  Pulse 73  Ht 5\' 1"  (1.549 m)  Wt 174 lb 1.6 oz (78.971 kg)  BMI 32.91 kg/m2 General appearance: alert, cooperative, appears stated age, no distress and mildly obese HEENT: [place]/AT, EOMI, MMM, anicteric sclera Neck: no adenopathy, no carotid bruit and no JVD Lungs: clear to auscultation bilaterally, normal percussion bilaterally and Nonlabored, and air movement Heart: regular rate and rhythm, S1, S2 normal, no murmur, click, rub or gallop and normal apical impulse Abdomen: soft, non-tender; bowel sounds normal; no masses, no organomegaly and Mildly obese Extremities: Mild varicose veins with ~1 + edema Pulses: 2+ and symmetric Neurologic: Alert and oriented X 3, normal strength and tone. Normal symmetric reflexes. Normal coordination and gait    Adult ECG Report  Rate: 73 ;  Rhythm: normal sinus rhythm, premature ventricular contractions (PVC) and Otherwise normal EKG with normal axis,  intervals and durations.;   Narrative Interpretation: stable EKG with the exception of the previously   Other studies Reviewed: Additional studies/ records that were reviewed today include:  Recent Labs:   No results found for: CHOL, HDL, LDLCALC, LDLDIRECT, TRIG, CHOLHDL   ASSESSMENT / PLAN: Problem List Items Addressed This Visit    Preoperative cardiovascular examination    Overall stable from a cardiac standpoint. Would be considered low risk patient for low risk surgery.  Continue beta blocker. No active heart failure or anginal symptoms. Low surgical risk for cardiac etiology.      Postoperative atrial fibrillation [facility])    His father and they'll come out no reported recurrence. As such, we have decided not to pursue anticoagulation. She is on stable dose of Toprol. Heart rate appear to be controlled. She doesn't notice the PVCs.      Essential hypertension - Primary (Chronic)    Well-controlled today on Toprol plus spironolactone      Relevant Orders   EKG 12-Lead (Completed)   Dyslipidemia (Chronic)    Remains unable to tolerate statins. Also unable tolerate WelChol or Zetia. LDL goal is less than 130. Monitored by PCP. She is taking red yeast rice.      Relevant Orders   EKG 12-Lead (Completed)     YOU ARE CLEARED FOR UPCOMING SURGERY.  Current medicines are reviewed at length with the patient today. (+/- concerns) none The following changes have been made: none  Studies Ordered:   Orders Placed This Encounter  Procedures  . EKG 12-Lead    ROV 12 months     HARDING, Leonie Green, M.D., M.S. Interventional Cardiologist   Pager # [phone]

## 2014-12-02 NOTE — Patient Instructions (Signed)
Your physician wants you to follow-up in [place]. You will receive a reminder letter in the mail two months in advance. If you don't receive a letter, please call our office to schedule the follow-up appointment.  YOU ARE CLEARED FOR UPCOMING SURGERY.   NO CHANGE WITH CURRENT MEDICATIONS.   If you need a refill on your cardiac medications before your next appointment, please call your pharmacy.

## 2014-12-03 ENCOUNTER — Encounter: Payer: Self-pay | Admitting: Cardiology

## 2014-12-03 DIAGNOSIS — D72829 Elevated white blood cell count, unspecified: Secondary | ICD-10-CM | POA: Diagnosis not present

## 2014-12-03 DIAGNOSIS — E7800 Pure hypercholesterolemia, unspecified: Secondary | ICD-10-CM | POA: Diagnosis not present

## 2014-12-03 DIAGNOSIS — Z0000 Encounter for general adult medical examination without abnormal findings: Secondary | ICD-10-CM | POA: Diagnosis not present

## 2014-12-03 NOTE — Assessment & Plan Note (Signed)
Remains unable to tolerate statins. Also unable tolerate WelChol or Zetia. LDL goal is less than 130. Monitored by PCP. She is taking red yeast rice.

## 2014-12-03 NOTE — Assessment & Plan Note (Signed)
His father and they'll come out no reported recurrence. As such, we have decided not to pursue anticoagulation. She is on stable dose of Toprol. Heart rate appear to be controlled. She doesn't notice the PVCs.

## 2014-12-04 ENCOUNTER — Telehealth: Payer: Self-pay | Admitting: *Deleted

## 2014-12-04 NOTE — Assessment & Plan Note (Signed)
Well-controlled today on Toprol plus spironolactone

## 2014-12-04 NOTE — Assessment & Plan Note (Signed)
Overall stable from a cardiac standpoint. Would be considered low risk patient for low risk surgery.  Continue beta blocker. No active heart failure or anginal symptoms. Low surgical risk for cardiac etiology.

## 2014-12-04 NOTE — Telephone Encounter (Signed)
Routed last office note  12/02/14-- For clearance for surgey

## 2014-12-10 ENCOUNTER — Ambulatory Visit (HOSPITAL_COMMUNITY)
Admission: RE | Admit: 2014-12-10 | Discharge: 2014-12-10 | Disposition: A | Discharge status: Home / Self Care | Payer: Medicare Other | Source: Ambulatory Visit | Attending: Endocrinology | Admitting: Endocrinology

## 2014-12-10 ENCOUNTER — Encounter (HOSPITAL_COMMUNITY): Payer: Self-pay

## 2014-12-10 DIAGNOSIS — M810 Age-related osteoporosis without current pathological fracture: Secondary | ICD-10-CM | POA: Diagnosis not present

## 2014-12-10 MED ORDER — DENOSUMAB 60 MG/ML ~~LOC~~ SOLN
60.0000 mg | Freq: Once | SUBCUTANEOUS | Status: AC
  Administered 2014-12-10: 60 mg via SUBCUTANEOUS
  Filled 2014-12-10: qty 1

## 2014-12-10 NOTE — Discharge Instructions (Signed)
Denosumab injection  What is this medicine?  DENOSUMAB (den oh sue mab) slows bone breakdown. Prolia is used to treat osteoporosis in women after menopause and in men. Xgeva is used to prevent bone fractures and other bone problems caused by cancer bone metastases. Xgeva is also used to treat giant cell tumor of the bone.  This medicine may be used for other purposes; ask your health care provider or pharmacist if you have questions.  What should I tell my health care provider before I take this medicine?  They need to know if you have any of these conditions:  -dental disease  -eczema  -infection or history of infections  -kidney disease or on dialysis  -low blood calcium or vitamin D  -malabsorption syndrome  -scheduled to have surgery or tooth extraction  -taking medicine that contains denosumab  -thyroid or parathyroid disease  -an unusual reaction to denosumab, other medicines, foods, dyes, or preservatives  -pregnant or trying to get pregnant  -breast-feeding  How should I use this medicine?  This medicine is for injection under the skin. It is given by a health care professional in a hospital or clinic setting.  If you are getting Prolia, a special MedGuide will be given to you by the pharmacist with each prescription and refill. Be sure to read this information carefully each time.  For Prolia, talk to your pediatrician regarding the use of this medicine in children. Special care may be needed. For Xgeva, talk to your pediatrician regarding the use of this medicine in children. While this drug may be prescribed for children as young as 13 years for selected conditions, precautions do apply.  Overdosage: If you think you have taken too much of this medicine contact a poison control center or emergency room at once.  NOTE: This medicine is only for you. Do not share this medicine with others.  What if I miss a dose?  It is important not to miss your dose. Call your doctor or health care professional if you are  unable to keep an appointment.  What may interact with this medicine?  Do not take this medicine with any of the following medications:  -other medicines containing denosumab  This medicine may also interact with the following medications:  -medicines that suppress the immune system  -medicines that treat cancer  -steroid medicines like prednisone or cortisone  This list may not describe all possible interactions. Give your health care provider a list of all the medicines, herbs, non-prescription drugs, or dietary supplements you use. Also tell them if you smoke, drink alcohol, or use illegal drugs. Some items may interact with your medicine.  What should I watch for while using this medicine?  Visit your doctor or health care professional for regular checks on your progress. Your doctor or health care professional may order blood tests and other tests to see how you are doing.  Call your doctor or health care professional if you get a cold or other infection while receiving this medicine. Do not treat yourself. This medicine may decrease your body's ability to fight infection.  You should make sure you get enough calcium and vitamin D while you are taking this medicine, unless your doctor tells you not to. Discuss the foods you eat and the vitamins you take with your health care professional.  See your dentist regularly. Brush and floss your teeth as directed. Before you have any dental work done, tell your dentist you are receiving this medicine.  Do   not become pregnant while taking this medicine or for 5 months after stopping it. Women should inform their doctor if they wish to become pregnant or think they might be pregnant. There is a potential for serious side effects to an unborn child. Talk to your health care professional or pharmacist for more information.  What side effects may I notice from receiving this medicine?  Side effects that you should report to your doctor or health care professional as soon as  possible:  -allergic reactions like skin rash, itching or hives, swelling of the face, lips, or tongue  -breathing problems  -chest pain  -fast, irregular heartbeat  -feeling faint or lightheaded, falls  -fever, chills, or any other sign of infection  -muscle spasms, tightening, or twitches  -numbness or tingling  -skin blisters or bumps, or is dry, peels, or red  -slow healing or unexplained pain in the mouth or jaw  -unusual bleeding or bruising  Side effects that usually do not require medical attention (Report these to your doctor or health care professional if they continue or are bothersome.):  -muscle pain  -stomach upset, gas  This list may not describe all possible side effects. Call your doctor for medical advice about side effects. You may report side effects to FDA at 1-800-FDA-1088.  Where should I keep my medicine?  This medicine is only given in a clinic, doctor's office, or other health care setting and will not be stored at home.  NOTE: This sheet is a summary. It may not cover all possible information. If you have questions about this medicine, talk to your doctor, pharmacist, or health care provider.      2016, [employer]vier/[employer]. (2011-06-28 12:37:47)

## 2014-12-19 DIAGNOSIS — J329 Chronic sinusitis, unspecified: Secondary | ICD-10-CM | POA: Diagnosis not present

## 2014-12-19 DIAGNOSIS — J399 Disease of upper respiratory tract, unspecified: Secondary | ICD-10-CM | POA: Diagnosis not present

## 2015-01-01 ENCOUNTER — Other Ambulatory Visit: Payer: Self-pay | Admitting: Cardiology

## 2015-01-01 NOTE — Telephone Encounter (Signed)
Rx(s) sent to pharmacy electronically.  

## 2015-01-21 ENCOUNTER — Other Ambulatory Visit: Payer: Self-pay

## 2015-01-21 DIAGNOSIS — Z1231 Encounter for screening mammogram for malignant neoplasm of breast: Secondary | ICD-10-CM

## 2015-01-23 DIAGNOSIS — E7800 Pure hypercholesterolemia, unspecified: Secondary | ICD-10-CM | POA: Diagnosis not present

## 2015-01-23 DIAGNOSIS — N390 Urinary tract infection, site not specified: Secondary | ICD-10-CM | POA: Diagnosis not present

## 2015-01-23 DIAGNOSIS — I10 Essential (primary) hypertension: Secondary | ICD-10-CM | POA: Diagnosis not present

## 2015-01-23 DIAGNOSIS — J329 Chronic sinusitis, unspecified: Secondary | ICD-10-CM | POA: Diagnosis not present

## 2015-01-24 ENCOUNTER — Encounter: Payer: Self-pay | Admitting: Cardiology

## 2015-01-28 DIAGNOSIS — R300 Dysuria: Secondary | ICD-10-CM | POA: Diagnosis not present

## 2015-02-06 DIAGNOSIS — Z86018 Personal history of other benign neoplasm: Secondary | ICD-10-CM | POA: Diagnosis not present

## 2015-02-06 DIAGNOSIS — Z808 Family history of malignant neoplasm of other organs or systems: Secondary | ICD-10-CM | POA: Diagnosis not present

## 2015-02-06 DIAGNOSIS — D225 Melanocytic nevi of trunk: Secondary | ICD-10-CM | POA: Diagnosis not present

## 2015-02-06 DIAGNOSIS — L918 Other hypertrophic disorders of the skin: Secondary | ICD-10-CM | POA: Diagnosis not present

## 2015-02-06 DIAGNOSIS — Z23 Encounter for immunization: Secondary | ICD-10-CM | POA: Diagnosis not present

## 2015-02-06 DIAGNOSIS — L820 Inflamed seborrheic keratosis: Secondary | ICD-10-CM | POA: Diagnosis not present

## 2015-02-06 DIAGNOSIS — L821 Other seborrheic keratosis: Secondary | ICD-10-CM | POA: Diagnosis not present

## 2015-02-06 DIAGNOSIS — L570 Actinic keratosis: Secondary | ICD-10-CM | POA: Diagnosis not present

## 2015-02-06 DIAGNOSIS — L720 Epidermal cyst: Secondary | ICD-10-CM | POA: Diagnosis not present

## 2015-02-18 ENCOUNTER — Ambulatory Visit
Admission: RE | Admit: 2015-02-18 | Discharge: 2015-02-18 | Disposition: A | Discharge status: Home / Self Care | Payer: Medicare Other | Source: Ambulatory Visit

## 2015-02-18 DIAGNOSIS — N390 Urinary tract infection, site not specified: Secondary | ICD-10-CM | POA: Diagnosis not present

## 2015-02-18 DIAGNOSIS — Z1231 Encounter for screening mammogram for malignant neoplasm of breast: Secondary | ICD-10-CM | POA: Diagnosis not present

## 2015-02-20 ENCOUNTER — Other Ambulatory Visit: Payer: Self-pay | Admitting: Obstetrics and Gynecology

## 2015-02-20 DIAGNOSIS — R928 Other abnormal and inconclusive findings on diagnostic imaging of breast: Secondary | ICD-10-CM

## 2015-02-26 ENCOUNTER — Ambulatory Visit
Admission: RE | Admit: 2015-02-26 | Discharge: 2015-02-26 | Disposition: A | Discharge status: Home / Self Care | Payer: Medicare Other | Source: Ambulatory Visit | Attending: Obstetrics and Gynecology | Admitting: Obstetrics and Gynecology

## 2015-02-26 DIAGNOSIS — N63 Unspecified lump in breast: Secondary | ICD-10-CM | POA: Diagnosis not present

## 2015-02-26 DIAGNOSIS — R928 Other abnormal and inconclusive findings on diagnostic imaging of breast: Secondary | ICD-10-CM

## 2015-02-28 DIAGNOSIS — N390 Urinary tract infection, site not specified: Secondary | ICD-10-CM | POA: Diagnosis not present

## 2015-03-12 DIAGNOSIS — S3992XA Unspecified injury of lower back, initial encounter: Secondary | ICD-10-CM | POA: Diagnosis not present

## 2015-03-12 DIAGNOSIS — M533 Sacrococcygeal disorders, not elsewhere classified: Secondary | ICD-10-CM | POA: Diagnosis not present

## 2015-03-12 DIAGNOSIS — Z9181 History of falling: Secondary | ICD-10-CM | POA: Diagnosis not present

## 2015-03-24 DIAGNOSIS — H2513 Age-related nuclear cataract, bilateral: Secondary | ICD-10-CM | POA: Diagnosis not present

## 2015-03-24 DIAGNOSIS — H1013 Acute atopic conjunctivitis, bilateral: Secondary | ICD-10-CM | POA: Diagnosis not present

## 2015-04-01 DIAGNOSIS — Z6833 Body mass index (BMI) 33.0-33.9, adult: Secondary | ICD-10-CM | POA: Diagnosis not present

## 2015-04-01 DIAGNOSIS — Z79899 Other long term (current) drug therapy: Secondary | ICD-10-CM | POA: Diagnosis not present

## 2015-04-01 DIAGNOSIS — M810 Age-related osteoporosis without current pathological fracture: Secondary | ICD-10-CM | POA: Diagnosis not present

## 2015-04-10 DIAGNOSIS — N390 Urinary tract infection, site not specified: Secondary | ICD-10-CM | POA: Diagnosis not present

## 2015-04-10 DIAGNOSIS — J069 Acute upper respiratory infection, unspecified: Secondary | ICD-10-CM | POA: Diagnosis not present

## 2015-04-10 DIAGNOSIS — R6889 Other general symptoms and signs: Secondary | ICD-10-CM | POA: Diagnosis not present

## 2015-04-10 DIAGNOSIS — R05 Cough: Secondary | ICD-10-CM | POA: Diagnosis not present

## 2015-04-10 DIAGNOSIS — R509 Fever, unspecified: Secondary | ICD-10-CM | POA: Diagnosis not present

## 2015-04-25 DIAGNOSIS — J45998 Other asthma: Secondary | ICD-10-CM | POA: Diagnosis not present

## 2015-04-25 DIAGNOSIS — J209 Acute bronchitis, unspecified: Secondary | ICD-10-CM | POA: Diagnosis not present

## 2015-05-12 DIAGNOSIS — J189 Pneumonia, unspecified organism: Secondary | ICD-10-CM | POA: Diagnosis not present

## 2015-05-12 DIAGNOSIS — R05 Cough: Secondary | ICD-10-CM | POA: Diagnosis not present

## 2015-05-15 DIAGNOSIS — J189 Pneumonia, unspecified organism: Secondary | ICD-10-CM | POA: Diagnosis not present

## 2015-06-03 DIAGNOSIS — J189 Pneumonia, unspecified organism: Secondary | ICD-10-CM | POA: Diagnosis not present

## 2015-06-10 ENCOUNTER — Ambulatory Visit (HOSPITAL_COMMUNITY)
Admission: RE | Admit: 2015-06-10 | Discharge: 2015-06-10 | Disposition: A | Discharge status: Home / Self Care | Payer: Medicare Other | Source: Ambulatory Visit | Attending: Endocrinology | Admitting: Endocrinology

## 2015-06-10 ENCOUNTER — Other Ambulatory Visit (HOSPITAL_COMMUNITY): Payer: Self-pay | Admitting: Endocrinology

## 2015-06-10 ENCOUNTER — Encounter (HOSPITAL_COMMUNITY): Payer: Self-pay

## 2015-06-10 DIAGNOSIS — M810 Age-related osteoporosis without current pathological fracture: Secondary | ICD-10-CM | POA: Insufficient documentation

## 2015-06-10 MED ORDER — DENOSUMAB 60 MG/ML ~~LOC~~ SOLN
60.0000 mg | Freq: Once | SUBCUTANEOUS | Status: AC
  Administered 2015-06-10: 60 mg via SUBCUTANEOUS
  Filled 2015-06-10: qty 1

## 2015-06-10 NOTE — Discharge Instructions (Signed)
Denosumab injection  What is this medicine?  DENOSUMAB (den oh sue mab) slows bone breakdown. Prolia is used to treat osteoporosis in women after menopause and in men. Xgeva is used to prevent bone fractures and other bone problems caused by cancer bone metastases. Xgeva is also used to treat giant cell tumor of the bone.  This medicine may be used for other purposes; ask your health care provider or pharmacist if you have questions.  What should I tell my health care provider before I take this medicine?  They need to know if you have any of these conditions:  -dental disease  -eczema  -infection or history of infections  -kidney disease or on dialysis  -low blood calcium or vitamin D  -malabsorption syndrome  -scheduled to have surgery or tooth extraction  -taking medicine that contains denosumab  -thyroid or parathyroid disease  -an unusual reaction to denosumab, other medicines, foods, dyes, or preservatives  -pregnant or trying to get pregnant  -breast-feeding  How should I use this medicine?  This medicine is for injection under the skin. It is given by a health care professional in a hospital or clinic setting.  If you are getting Prolia, a special MedGuide will be given to you by the pharmacist with each prescription and refill. Be sure to read this information carefully each time.  For Prolia, talk to your pediatrician regarding the use of this medicine in children. Special care may be needed. For Xgeva, talk to your pediatrician regarding the use of this medicine in children. While this drug may be prescribed for children as young as 13 years for selected conditions, precautions do apply.  Overdosage: If you think you have taken too much of this medicine contact a poison control center or emergency room at once.  NOTE: This medicine is only for you. Do not share this medicine with others.  What if I miss a dose?  It is important not to miss your dose. Call your doctor or health care professional if you are  unable to keep an appointment.  What may interact with this medicine?  Do not take this medicine with any of the following medications:  -other medicines containing denosumab  This medicine may also interact with the following medications:  -medicines that suppress the immune system  -medicines that treat cancer  -steroid medicines like prednisone or cortisone  This list may not describe all possible interactions. Give your health care provider a list of all the medicines, herbs, non-prescription drugs, or dietary supplements you use. Also tell them if you smoke, drink alcohol, or use illegal drugs. Some items may interact with your medicine.  What should I watch for while using this medicine?  Visit your doctor or health care professional for regular checks on your progress. Your doctor or health care professional may order blood tests and other tests to see how you are doing.  Call your doctor or health care professional if you get a cold or other infection while receiving this medicine. Do not treat yourself. This medicine may decrease your body's ability to fight infection.  You should make sure you get enough calcium and vitamin D while you are taking this medicine, unless your doctor tells you not to. Discuss the foods you eat and the vitamins you take with your health care professional.  See your dentist regularly. Brush and floss your teeth as directed. Before you have any dental work done, tell your dentist you are receiving this medicine.  Do   not become pregnant while taking this medicine or for 5 months after stopping it. Women should inform their doctor if they wish to become pregnant or think they might be pregnant. There is a potential for serious side effects to an unborn child. Talk to your health care professional or pharmacist for more information.  What side effects may I notice from receiving this medicine?  Side effects that you should report to your doctor or health care professional as soon as  possible:  -allergic reactions like skin rash, itching or hives, swelling of the face, lips, or tongue  -breathing problems  -chest pain  -fast, irregular heartbeat  -feeling faint or lightheaded, falls  -fever, chills, or any other sign of infection  -muscle spasms, tightening, or twitches  -numbness or tingling  -skin blisters or bumps, or is dry, peels, or red  -slow healing or unexplained pain in the mouth or jaw  -unusual bleeding or bruising  Side effects that usually do not require medical attention (Report these to your doctor or health care professional if they continue or are bothersome.):  -muscle pain  -stomach upset, gas  This list may not describe all possible side effects. Call your doctor for medical advice about side effects. You may report side effects to FDA at 1-800-FDA-1088.  Where should I keep my medicine?  This medicine is only given in a clinic, doctor's office, or other health care setting and will not be stored at home.  NOTE: This sheet is a summary. It may not cover all possible information. If you have questions about this medicine, talk to your doctor, pharmacist, or health care provider.      2016, [employer]vier/[employer]. (2011-06-28 12:37:47)

## 2015-06-11 DIAGNOSIS — M19012 Primary osteoarthritis, left shoulder: Secondary | ICD-10-CM | POA: Diagnosis not present

## 2015-06-11 DIAGNOSIS — M25511 Pain in right shoulder: Secondary | ICD-10-CM | POA: Diagnosis not present

## 2015-06-12 DIAGNOSIS — L0390 Cellulitis, unspecified: Secondary | ICD-10-CM | POA: Diagnosis not present

## 2015-07-02 DIAGNOSIS — R351 Nocturia: Secondary | ICD-10-CM | POA: Diagnosis not present

## 2015-07-02 DIAGNOSIS — N3020 Other chronic cystitis without hematuria: Secondary | ICD-10-CM | POA: Diagnosis not present

## 2015-07-23 DIAGNOSIS — M810 Age-related osteoporosis without current pathological fracture: Secondary | ICD-10-CM | POA: Diagnosis not present

## 2015-07-23 DIAGNOSIS — K219 Gastro-esophageal reflux disease without esophagitis: Secondary | ICD-10-CM | POA: Diagnosis not present

## 2015-07-23 DIAGNOSIS — I10 Essential (primary) hypertension: Secondary | ICD-10-CM | POA: Diagnosis not present

## 2015-08-27 DIAGNOSIS — M7061 Trochanteric bursitis, right hip: Secondary | ICD-10-CM | POA: Diagnosis not present

## 2015-08-27 DIAGNOSIS — M7062 Trochanteric bursitis, left hip: Secondary | ICD-10-CM | POA: Diagnosis not present

## 2015-09-01 DIAGNOSIS — I10 Essential (primary) hypertension: Secondary | ICD-10-CM | POA: Diagnosis not present

## 2015-09-08 DIAGNOSIS — I10 Essential (primary) hypertension: Secondary | ICD-10-CM | POA: Diagnosis not present

## 2015-09-12 ENCOUNTER — Other Ambulatory Visit: Payer: Self-pay

## 2015-09-16 ENCOUNTER — Other Ambulatory Visit: Payer: Self-pay | Admitting: Obstetrics and Gynecology

## 2015-09-16 DIAGNOSIS — N6012 Diffuse cystic mastopathy of left breast: Secondary | ICD-10-CM

## 2015-09-24 DIAGNOSIS — R0989 Other specified symptoms and signs involving the circulatory and respiratory systems: Secondary | ICD-10-CM | POA: Diagnosis not present

## 2015-10-07 ENCOUNTER — Other Ambulatory Visit: Payer: Medicare Other

## 2015-10-09 ENCOUNTER — Ambulatory Visit
Admission: RE | Admit: 2015-10-09 | Discharge: 2015-10-09 | Disposition: A | Discharge status: Home / Self Care | Payer: Medicare Other | Source: Ambulatory Visit | Attending: Obstetrics and Gynecology | Admitting: Obstetrics and Gynecology

## 2015-10-09 DIAGNOSIS — N6012 Diffuse cystic mastopathy of left breast: Secondary | ICD-10-CM

## 2015-10-09 DIAGNOSIS — N63 Unspecified lump in breast: Secondary | ICD-10-CM | POA: Diagnosis not present

## 2015-10-20 ENCOUNTER — Other Ambulatory Visit: Payer: Self-pay | Admitting: Cardiology

## 2015-11-20 DIAGNOSIS — R0989 Other specified symptoms and signs involving the circulatory and respiratory systems: Secondary | ICD-10-CM | POA: Diagnosis not present

## 2015-11-20 DIAGNOSIS — R0609 Other forms of dyspnea: Secondary | ICD-10-CM | POA: Diagnosis not present

## 2015-11-20 DIAGNOSIS — E7800 Pure hypercholesterolemia, unspecified: Secondary | ICD-10-CM | POA: Diagnosis not present

## 2015-11-20 DIAGNOSIS — I10 Essential (primary) hypertension: Secondary | ICD-10-CM | POA: Diagnosis not present

## 2015-12-09 DIAGNOSIS — I10 Essential (primary) hypertension: Secondary | ICD-10-CM | POA: Diagnosis not present

## 2015-12-09 DIAGNOSIS — R0602 Shortness of breath: Secondary | ICD-10-CM | POA: Diagnosis not present

## 2015-12-10 ENCOUNTER — Ambulatory Visit (HOSPITAL_COMMUNITY)
Admission: RE | Admit: 2015-12-10 | Discharge: 2015-12-10 | Disposition: A | Discharge status: Home / Self Care | Payer: Medicare Other | Source: Ambulatory Visit | Attending: Endocrinology | Admitting: Endocrinology

## 2015-12-10 ENCOUNTER — Encounter (HOSPITAL_COMMUNITY): Payer: Self-pay

## 2015-12-10 DIAGNOSIS — M810 Age-related osteoporosis without current pathological fracture: Secondary | ICD-10-CM | POA: Insufficient documentation

## 2015-12-10 MED ORDER — DENOSUMAB 60 MG/ML ~~LOC~~ SOLN
60.0000 mg | Freq: Once | SUBCUTANEOUS | Status: AC
  Administered 2015-12-10: 60 mg via SUBCUTANEOUS
  Filled 2015-12-10: qty 1

## 2015-12-10 NOTE — Discharge Instructions (Signed)
PROLIA °Denosumab injection °What is this medicine? °DENOSUMAB (den oh sue mab) slows bone breakdown. Prolia is used to treat osteoporosis in women after menopause and in men. Xgeva is used to prevent bone fractures and other bone problems caused by cancer bone metastases. Xgeva is also used to treat giant cell tumor of the bone. °COMMON BRAND NAME(S): Prolia, XGEVA °What should I tell my health care provider before I take this medicine? °They need to know if you have any of these conditions: °-dental disease °-eczema °-infection or history of infections °-kidney disease or on dialysis °-low blood calcium or vitamin D °-malabsorption syndrome °-scheduled to have surgery or tooth extraction °-taking medicine that contains denosumab °-thyroid or parathyroid disease °-an unusual reaction to denosumab, other medicines, foods, dyes, or preservatives °-pregnant or trying to get pregnant °-breast-feeding °How should I use this medicine? °This medicine is for injection under the skin. It is given by a health care professional in a hospital or clinic setting. °If you are getting Prolia, a special MedGuide will be given to you by the pharmacist with each prescription and refill. Be sure to read this information carefully each time. °For Prolia, talk to your pediatrician regarding the use of this medicine in children. Special care may be needed. For Xgeva, talk to your pediatrician regarding the use of this medicine in children. While this drug may be prescribed for children as young as 13 years for selected conditions, precautions do apply. °What if I miss a dose? °It is important not to miss your dose. Call your doctor or health care professional if you are unable to keep an appointment. °What may interact with this medicine? °Do not take this medicine with any of the following medications: °-other medicines containing denosumab °This medicine may also interact with the following medications: °-medicines that suppress the  immune system °-medicines that treat cancer °-steroid medicines like prednisone or cortisone °What should I watch for while using this medicine? °Visit your doctor or health care professional for regular checks on your progress. Your doctor or health care professional may order blood tests and other tests to see how you are doing. °Call your doctor or health care professional if you get a cold or other infection while receiving this medicine. Do not treat yourself. This medicine may decrease your body's ability to fight infection. °You should make sure you get enough calcium and vitamin D while you are taking this medicine, unless your doctor tells you not to. Discuss the foods you eat and the vitamins you take with your health care professional. °See your dentist regularly. Brush and floss your teeth as directed. Before you have any dental work done, tell your dentist you are receiving this medicine. °Do not become pregnant while taking this medicine or for 5 months after stopping it. Women should inform their doctor if they wish to become pregnant or think they might be pregnant. There is a potential for serious side effects to an unborn child. Talk to your health care professional or pharmacist for more information. °What side effects may I notice from receiving this medicine? °Side effects that you should report to your doctor or health care professional as soon as possible: °-allergic reactions like skin rash, itching or hives, swelling of the face, lips, or tongue °-breathing problems °-chest pain °-fast, irregular heartbeat °-feeling faint or lightheaded, falls °-fever, chills, or any other sign of infection °-muscle spasms, tightening, or twitches °-numbness or tingling °-skin blisters or bumps, or is dry, peels, or red °-  slow healing or unexplained pain in the mouth or jaw °-unusual bleeding or bruising °Side effects that usually do not require medical attention (report to your doctor or health care  professional if they continue or are bothersome): °-muscle pain °-stomach upset, gas °Where should I keep my medicine? °This medicine is only given in a clinic, doctor's office, or other health care setting and will not be stored at home. °© 2017 [employer] Standard (2015-01-30 10:06:55) ° °Osteoporosis °Osteoporosis is the thinning and loss of density in the bones. Osteoporosis makes the bones more brittle, fragile, and likely to break (fracture). Over time, osteoporosis can cause the bones to become so weak that they fracture after a simple fall. The bones most likely to fracture are the bones in the hip, wrist, and spine. °What are the causes? °The exact cause is not known. °What increases the risk? °Anyone can develop osteoporosis. You may be at greater risk if you have a family history of the condition or have poor nutrition. You may also have a higher risk if you are: °· Female. °· 50 years old or older. °· A smoker. °· Not physically active. °· White or Asian. °· Slender. °What are the signs or symptoms? °A fracture might be the first sign of the disease, especially if it results from a fall or injury that would not usually cause a bone to break. Other signs and symptoms include: °· Low back and neck pain. °· Stooped posture. °· Height loss. °How is this diagnosed? °To make a diagnosis, your health care provider may: °· Take a medical history. °· Perform a physical exam. °· Order tests, such as: °¨ A bone mineral density test. °¨ A dual-energy X-ray absorptiometry test. °How is this treated? °The goal of osteoporosis treatment is to strengthen your bones to reduce your risk of a fracture. Treatment may involve: °· Making lifestyle changes, such as: °¨ Eating a diet rich in calcium. °¨ Doing weight-bearing and muscle-strengthening exercises. °¨ Stopping tobacco use. °¨ Limiting alcohol intake. °· Taking medicine to slow the process of bone loss or to increase bone density. °· Monitoring your levels of  calcium and vitamin D. °Follow these instructions at home: °· Include calcium and vitamin D in your diet. Calcium is important for bone health, and vitamin D helps the body absorb calcium. °· Perform weight-bearing and muscle-strengthening exercises as directed by your health care provider. °· Do not use any tobacco products, including cigarettes, chewing tobacco, and electronic cigarettes. If you need help quitting, ask your health care provider. °· Limit your alcohol intake. °· Take medicines only as directed by your health care provider. °· Keep all follow-up visits as directed by your health care provider. This is important. °· Take precautions at home to lower your risk of falling, such as: °¨ Keeping rooms well lit and clutter free. °¨ Installing safety rails on stairs. °¨ Using rubber mats in the bathroom and other areas that are often wet or slippery. °Get help right away if: °You fall or injure yourself. °This information is not intended to replace advice given to you by your health care provider. Make sure you discuss any questions you have with your health care provider. °Document Released: 10/07/2004 Document Revised: 06/02/2015 Document Reviewed: 06/07/2013 °Elsevier Interactive Patient Education © 2017 [employer]. ° ° °

## 2015-12-11 DIAGNOSIS — I10 Essential (primary) hypertension: Secondary | ICD-10-CM | POA: Diagnosis not present

## 2015-12-11 DIAGNOSIS — E7800 Pure hypercholesterolemia, unspecified: Secondary | ICD-10-CM | POA: Diagnosis not present

## 2015-12-11 DIAGNOSIS — R0602 Shortness of breath: Secondary | ICD-10-CM | POA: Diagnosis not present

## 2015-12-17 DIAGNOSIS — R0609 Other forms of dyspnea: Secondary | ICD-10-CM | POA: Diagnosis not present

## 2015-12-17 DIAGNOSIS — Z006 Encounter for examination for normal comparison and control in clinical research program: Secondary | ICD-10-CM | POA: Diagnosis not present

## 2015-12-17 DIAGNOSIS — I10 Essential (primary) hypertension: Secondary | ICD-10-CM | POA: Diagnosis not present

## 2015-12-18 DIAGNOSIS — L309 Dermatitis, unspecified: Secondary | ICD-10-CM | POA: Diagnosis not present

## 2015-12-18 DIAGNOSIS — Z23 Encounter for immunization: Secondary | ICD-10-CM | POA: Diagnosis not present

## 2016-01-07 DIAGNOSIS — E7800 Pure hypercholesterolemia, unspecified: Secondary | ICD-10-CM | POA: Diagnosis not present

## 2016-01-07 DIAGNOSIS — Z0000 Encounter for general adult medical examination without abnormal findings: Secondary | ICD-10-CM | POA: Diagnosis not present

## 2016-01-07 DIAGNOSIS — I10 Essential (primary) hypertension: Secondary | ICD-10-CM | POA: Diagnosis not present

## 2016-01-07 DIAGNOSIS — E559 Vitamin D deficiency, unspecified: Secondary | ICD-10-CM | POA: Diagnosis not present

## 2016-01-22 DIAGNOSIS — L308 Other specified dermatitis: Secondary | ICD-10-CM | POA: Diagnosis not present

## 2016-01-22 DIAGNOSIS — Z23 Encounter for immunization: Secondary | ICD-10-CM | POA: Diagnosis not present

## 2016-02-02 DIAGNOSIS — Z0000 Encounter for general adult medical examination without abnormal findings: Secondary | ICD-10-CM | POA: Diagnosis not present

## 2016-02-05 DIAGNOSIS — K219 Gastro-esophageal reflux disease without esophagitis: Secondary | ICD-10-CM | POA: Diagnosis not present

## 2016-02-05 DIAGNOSIS — I10 Essential (primary) hypertension: Secondary | ICD-10-CM | POA: Diagnosis not present

## 2016-02-05 DIAGNOSIS — J069 Acute upper respiratory infection, unspecified: Secondary | ICD-10-CM | POA: Diagnosis not present

## 2016-02-05 DIAGNOSIS — M810 Age-related osteoporosis without current pathological fracture: Secondary | ICD-10-CM | POA: Diagnosis not present

## 2016-02-10 DIAGNOSIS — D225 Melanocytic nevi of trunk: Secondary | ICD-10-CM | POA: Diagnosis not present

## 2016-02-10 DIAGNOSIS — D2271 Melanocytic nevi of right lower limb, including hip: Secondary | ICD-10-CM | POA: Diagnosis not present

## 2016-02-10 DIAGNOSIS — Z808 Family history of malignant neoplasm of other organs or systems: Secondary | ICD-10-CM | POA: Diagnosis not present

## 2016-02-10 DIAGNOSIS — Z86018 Personal history of other benign neoplasm: Secondary | ICD-10-CM | POA: Diagnosis not present

## 2016-02-10 DIAGNOSIS — L308 Other specified dermatitis: Secondary | ICD-10-CM | POA: Diagnosis not present

## 2016-02-10 DIAGNOSIS — L821 Other seborrheic keratosis: Secondary | ICD-10-CM | POA: Diagnosis not present

## 2016-03-25 DIAGNOSIS — Z124 Encounter for screening for malignant neoplasm of cervix: Secondary | ICD-10-CM | POA: Diagnosis not present

## 2016-03-25 DIAGNOSIS — Z6833 Body mass index (BMI) 33.0-33.9, adult: Secondary | ICD-10-CM | POA: Diagnosis not present

## 2016-03-25 DIAGNOSIS — M8588 Other specified disorders of bone density and structure, other site: Secondary | ICD-10-CM | POA: Diagnosis not present

## 2016-03-25 DIAGNOSIS — N958 Other specified menopausal and perimenopausal disorders: Secondary | ICD-10-CM | POA: Diagnosis not present

## 2016-04-14 DIAGNOSIS — L308 Other specified dermatitis: Secondary | ICD-10-CM | POA: Diagnosis not present

## 2016-04-14 DIAGNOSIS — L309 Dermatitis, unspecified: Secondary | ICD-10-CM | POA: Diagnosis not present

## 2016-04-27 DIAGNOSIS — Z79899 Other long term (current) drug therapy: Secondary | ICD-10-CM | POA: Diagnosis not present

## 2016-04-27 DIAGNOSIS — M810 Age-related osteoporosis without current pathological fracture: Secondary | ICD-10-CM | POA: Diagnosis not present

## 2016-05-03 DIAGNOSIS — L239 Allergic contact dermatitis, unspecified cause: Secondary | ICD-10-CM | POA: Diagnosis not present

## 2016-05-07 DIAGNOSIS — L259 Unspecified contact dermatitis, unspecified cause: Secondary | ICD-10-CM | POA: Diagnosis not present

## 2016-06-09 ENCOUNTER — Ambulatory Visit (HOSPITAL_COMMUNITY)
Admission: RE | Admit: 2016-06-09 | Discharge: 2016-06-09 | Disposition: A | Discharge status: Home / Self Care | Payer: Medicare Other | Source: Ambulatory Visit | Attending: Endocrinology | Admitting: Endocrinology

## 2016-06-12 DIAGNOSIS — H00022 Hordeolum internum right lower eyelid: Secondary | ICD-10-CM | POA: Diagnosis not present

## 2016-06-15 DIAGNOSIS — L710 Perioral dermatitis: Secondary | ICD-10-CM | POA: Diagnosis not present

## 2016-06-21 ENCOUNTER — Other Ambulatory Visit: Payer: Self-pay | Admitting: Obstetrics and Gynecology

## 2016-06-21 DIAGNOSIS — N6320 Unspecified lump in the left breast, unspecified quadrant: Secondary | ICD-10-CM

## 2016-06-21 DIAGNOSIS — H00024 Hordeolum internum left upper eyelid: Secondary | ICD-10-CM | POA: Diagnosis not present

## 2016-06-24 DIAGNOSIS — I10 Essential (primary) hypertension: Secondary | ICD-10-CM | POA: Diagnosis not present

## 2016-06-24 DIAGNOSIS — L270 Generalized skin eruption due to drugs and medicaments taken internally: Secondary | ICD-10-CM | POA: Diagnosis not present

## 2016-06-24 DIAGNOSIS — R0609 Other forms of dyspnea: Secondary | ICD-10-CM | POA: Diagnosis not present

## 2016-06-24 DIAGNOSIS — Z006 Encounter for examination for normal comparison and control in clinical research program: Secondary | ICD-10-CM | POA: Diagnosis not present

## 2016-06-28 ENCOUNTER — Encounter (HOSPITAL_COMMUNITY): Payer: Self-pay

## 2016-06-28 ENCOUNTER — Ambulatory Visit (HOSPITAL_COMMUNITY)
Admission: RE | Admit: 2016-06-28 | Discharge: 2016-06-28 | Disposition: A | Discharge status: Home / Self Care | Payer: Medicare Other | Source: Ambulatory Visit | Attending: Endocrinology | Admitting: Endocrinology

## 2016-06-28 DIAGNOSIS — M810 Age-related osteoporosis without current pathological fracture: Secondary | ICD-10-CM | POA: Diagnosis not present

## 2016-06-28 MED ORDER — DENOSUMAB 60 MG/ML ~~LOC~~ SOLN
60.0000 mg | Freq: Once | SUBCUTANEOUS | Status: AC
  Administered 2016-06-28: 60 mg via SUBCUTANEOUS
  Filled 2016-06-28: qty 1

## 2016-06-28 NOTE — Discharge Instructions (Signed)
Denosumab injection °What is this medicine? °DENOSUMAB (den oh sue mab) slows bone breakdown. Prolia is used to treat osteoporosis in women after menopause and in men. Xgeva is used to treat a high calcium level due to cancer and to prevent bone fractures and other bone problems caused by multiple myeloma or cancer bone metastases. Xgeva is also used to treat giant cell tumor of the bone. °This medicine may be used for other purposes; ask your health care provider or pharmacist if you have questions. °COMMON BRAND NAME(S): Prolia, XGEVA °What should I tell my health care provider before I take this medicine? °They need to know if you have any of these conditions: °-dental disease °-having surgery or tooth extraction °-infection °-kidney disease °-low levels of calcium or Vitamin D in the blood °-malnutrition °-on hemodialysis °-skin conditions or sensitivity °-thyroid or parathyroid disease °-an unusual reaction to denosumab, other medicines, foods, dyes, or preservatives °-pregnant or trying to get pregnant °-breast-feeding °How should I use this medicine? °This medicine is for injection under the skin. It is given by a health care professional in a hospital or clinic setting. °If you are getting Prolia, a special MedGuide will be given to you by the pharmacist with each prescription and refill. Be sure to read this information carefully each time. °For Prolia, talk to your pediatrician regarding the use of this medicine in children. Special care may be needed. For Xgeva, talk to your pediatrician regarding the use of this medicine in children. While this drug may be prescribed for children as young as 13 years for selected conditions, precautions do apply. °Overdosage: If you think you have taken too much of this medicine contact a poison control center or emergency room at once. °NOTE: This medicine is only for you. Do not share this medicine with others. °What if I miss a dose? °It is important not to miss your  dose. Call your doctor or health care professional if you are unable to keep an appointment. °What may interact with this medicine? °Do not take this medicine with any of the following medications: °-other medicines containing denosumab °This medicine may also interact with the following medications: °-medicines that lower your chance of fighting infection °-steroid medicines like prednisone or cortisone °This list may not describe all possible interactions. Give your health care provider a list of all the medicines, herbs, non-prescription drugs, or dietary supplements you use. Also tell them if you smoke, drink alcohol, or use illegal drugs. Some items may interact with your medicine. °What should I watch for while using this medicine? °Visit your doctor or health care professional for regular checks on your progress. Your doctor or health care professional may order blood tests and other tests to see how you are doing. °Call your doctor or health care professional for advice if you get a fever, chills or sore throat, or other symptoms of a cold or flu. Do not treat yourself. This drug may decrease your body's ability to fight infection. Try to avoid being around people who are sick. °You should make sure you get enough calcium and vitamin D while you are taking this medicine, unless your doctor tells you not to. Discuss the foods you eat and the vitamins you take with your health care professional. °See your dentist regularly. Brush and floss your teeth as directed. Before you have any dental work done, tell your dentist you are receiving this medicine. °Do not become pregnant while taking this medicine or for 5 months after stopping   it. Talk with your doctor or health care professional about your birth control options while taking this medicine. Women should inform their doctor if they wish to become pregnant or think they might be pregnant. There is a potential for serious side effects to an unborn child. Talk  to your health care professional or pharmacist for more information. What side effects may I notice from receiving this medicine? Side effects that you should report to your doctor or health care professional as soon as possible: -allergic reactions like skin rash, itching or hives, swelling of the face, lips, or tongue -bone pain -breathing problems -dizziness -jaw pain, especially after dental work -redness, blistering, peeling of the skin -signs and symptoms of infection like fever or chills; cough; sore throat; pain or trouble passing urine -signs of low calcium like fast heartbeat, muscle cramps or muscle pain; pain, tingling, numbness in the hands or feet; seizures -unusual bleeding or bruising -unusually weak or tired Side effects that usually do not require medical attention (report to your doctor or health care professional if they continue or are bothersome): -constipation -diarrhea -headache -joint pain -loss of appetite -muscle pain -runny nose -tiredness -upset stomach This list may not describe all possible side effects. Call your doctor for medical advice about side effects. You may report side effects to FDA at 1-800-FDA-1088. Where should I keep my medicine? This medicine is only given in a clinic, doctor's office, or other health care setting and will not be stored at home. NOTE: This sheet is a summary. It may not cover all possible information. If you have questions about this medicine, talk to your doctor, pharmacist, or health care provider.  2018 [employer] Standard (2016-01-20 19:17:21)

## 2016-06-30 ENCOUNTER — Ambulatory Visit
Admission: RE | Admit: 2016-06-30 | Discharge: 2016-06-30 | Disposition: A | Discharge status: Home / Self Care | Payer: Medicare Other | Source: Ambulatory Visit | Attending: Obstetrics and Gynecology | Admitting: Obstetrics and Gynecology

## 2016-06-30 DIAGNOSIS — R928 Other abnormal and inconclusive findings on diagnostic imaging of breast: Secondary | ICD-10-CM | POA: Diagnosis not present

## 2016-06-30 DIAGNOSIS — N6320 Unspecified lump in the left breast, unspecified quadrant: Secondary | ICD-10-CM

## 2016-06-30 DIAGNOSIS — N6002 Solitary cyst of left breast: Secondary | ICD-10-CM | POA: Diagnosis not present

## 2016-08-03 DIAGNOSIS — H00024 Hordeolum internum left upper eyelid: Secondary | ICD-10-CM | POA: Diagnosis not present

## 2016-09-15 DIAGNOSIS — M7061 Trochanteric bursitis, right hip: Secondary | ICD-10-CM | POA: Diagnosis not present

## 2016-09-15 DIAGNOSIS — M7062 Trochanteric bursitis, left hip: Secondary | ICD-10-CM | POA: Diagnosis not present

## 2016-09-15 DIAGNOSIS — M25511 Pain in right shoulder: Secondary | ICD-10-CM | POA: Diagnosis not present

## 2016-12-29 ENCOUNTER — Ambulatory Visit (HOSPITAL_COMMUNITY)
Admission: RE | Admit: 2016-12-29 | Discharge: 2016-12-29 | Disposition: A | Discharge status: Home / Self Care | Payer: Medicare Other | Source: Ambulatory Visit | Attending: Endocrinology | Admitting: Endocrinology

## 2016-12-29 ENCOUNTER — Encounter (HOSPITAL_COMMUNITY): Payer: Self-pay

## 2016-12-29 DIAGNOSIS — M810 Age-related osteoporosis without current pathological fracture: Secondary | ICD-10-CM | POA: Insufficient documentation

## 2016-12-29 MED ORDER — DENOSUMAB 60 MG/ML ~~LOC~~ SOLN
60.0000 mg | Freq: Once | SUBCUTANEOUS | Status: AC
  Administered 2016-12-29: 60 mg via SUBCUTANEOUS
  Filled 2016-12-29: qty 1

## 2016-12-29 NOTE — Discharge Instructions (Signed)
Denosumab injection / Prolia What is this medicine? DENOSUMAB (den oh sue mab) slows bone breakdown. Prolia is used to treat osteoporosis in women after menopause and in men. Delton See is used to treat a high calcium level due to cancer and to prevent bone fractures and other bone problems caused by multiple myeloma or cancer bone metastases. Delton See is also used to treat giant cell tumor of the bone. This medicine may be used for other purposes; ask your health care provider or pharmacist if you have questions. COMMON BRAND NAME(S): Prolia, XGEVA What should I tell my health care provider before I take this medicine? They need to know if you have any of these conditions: -dental disease -having surgery or tooth extraction -infection -kidney disease -low levels of calcium or Vitamin D in the blood -malnutrition -on hemodialysis -skin conditions or sensitivity -thyroid or parathyroid disease -an unusual reaction to denosumab, other medicines, foods, dyes, or preservatives -pregnant or trying to get pregnant -breast-feeding How should I use this medicine? This medicine is for injection under the skin. It is given by a health care professional in a hospital or clinic setting. If you are getting Prolia, a special MedGuide will be given to you by the pharmacist with each prescription and refill. Be sure to read this information carefully each time. For Prolia, talk to your pediatrician regarding the use of this medicine in children. Special care may be needed. For Delton See, talk to your pediatrician regarding the use of this medicine in children. While this drug may be prescribed for children as young as 13 years for selected conditions, precautions do apply. Overdosage: If you think you have taken too much of this medicine contact a poison control center or emergency room at once. NOTE: This medicine is only for you. Do not share this medicine with others. What if I miss a dose? It is important not to  miss your dose. Call your doctor or health care professional if you are unable to keep an appointment. What may interact with this medicine? Do not take this medicine with any of the following medications: -other medicines containing denosumab This medicine may also interact with the following medications: -medicines that lower your chance of fighting infection -steroid medicines like prednisone or cortisone This list may not describe all possible interactions. Give your health care provider a list of all the medicines, herbs, non-prescription drugs, or dietary supplements you use. Also tell them if you smoke, drink alcohol, or use illegal drugs. Some items may interact with your medicine. What should I watch for while using this medicine? Visit your doctor or health care professional for regular checks on your progress. Your doctor or health care professional may order blood tests and other tests to see how you are doing. Call your doctor or health care professional for advice if you get a fever, chills or sore throat, or other symptoms of a cold or flu. Do not treat yourself. This drug may decrease your body's ability to fight infection. Try to avoid being around people who are sick. You should make sure you get enough calcium and vitamin D while you are taking this medicine, unless your doctor tells you not to. Discuss the foods you eat and the vitamins you take with your health care professional. See your dentist regularly. Brush and floss your teeth as directed. Before you have any dental work done, tell your dentist you are receiving this medicine. Do not become pregnant while taking this medicine or for 5 months  after stopping it. Talk with your doctor or health care professional about your birth control options while taking this medicine. Women should inform their doctor if they wish to become pregnant or think they might be pregnant. There is a potential for serious side effects to an unborn  child. Talk to your health care professional or pharmacist for more information. What side effects may I notice from receiving this medicine? Side effects that you should report to your doctor or health care professional as soon as possible: -allergic reactions like skin rash, itching or hives, swelling of the face, lips, or tongue -bone pain -breathing problems -dizziness -jaw pain, especially after dental work -redness, blistering, peeling of the skin -signs and symptoms of infection like fever or chills; cough; sore throat; pain or trouble passing urine -signs of low calcium like fast heartbeat, muscle cramps or muscle pain; pain, tingling, numbness in the hands or feet; seizures -unusual bleeding or bruising -unusually weak or tired Side effects that usually do not require medical attention (report to your doctor or health care professional if they continue or are bothersome): -constipation -diarrhea -headache -joint pain -loss of appetite -muscle pain -runny nose -tiredness -upset stomach This list may not describe all possible side effects. Call your doctor for medical advice about side effects. You may report side effects to FDA at 1-800-FDA-1088. Where should I keep my medicine? This medicine is only given in a clinic, doctor's office, or other health care setting and will not be stored at home. NOTE: This sheet is a summary. It may not cover all possible information. If you have questions about this medicine, talk to your doctor, pharmacist, or health care provider.  2018 [employer] Standard (2016-01-20 19:17:21)

## 2016-12-29 NOTE — Progress Notes (Signed)
Pt states she has received cortisone injections, last on Set 18, 2018 in bilateral hips for joint pain / decreased mobility. She had received prolia in June 2018 and did not have any problems. Call made to Dr Baldwin [facility] office to notify him of previous prior to injection.

## 2017-01-18 DIAGNOSIS — I10 Essential (primary) hypertension: Secondary | ICD-10-CM | POA: Diagnosis not present

## 2017-01-18 DIAGNOSIS — Z006 Encounter for examination for normal comparison and control in clinical research program: Secondary | ICD-10-CM | POA: Diagnosis not present

## 2017-01-18 DIAGNOSIS — L270 Generalized skin eruption due to drugs and medicaments taken internally: Secondary | ICD-10-CM | POA: Diagnosis not present

## 2017-02-01 DIAGNOSIS — I10 Essential (primary) hypertension: Secondary | ICD-10-CM | POA: Diagnosis not present

## 2017-02-01 DIAGNOSIS — Z0000 Encounter for general adult medical examination without abnormal findings: Secondary | ICD-10-CM | POA: Diagnosis not present

## 2017-02-01 DIAGNOSIS — N390 Urinary tract infection, site not specified: Secondary | ICD-10-CM | POA: Diagnosis not present

## 2017-02-01 DIAGNOSIS — E789 Disorder of lipoprotein metabolism, unspecified: Secondary | ICD-10-CM | POA: Diagnosis not present

## 2017-02-01 DIAGNOSIS — E559 Vitamin D deficiency, unspecified: Secondary | ICD-10-CM | POA: Diagnosis not present

## 2017-02-07 DIAGNOSIS — G2581 Restless legs syndrome: Secondary | ICD-10-CM | POA: Diagnosis not present

## 2017-02-07 DIAGNOSIS — E7800 Pure hypercholesterolemia, unspecified: Secondary | ICD-10-CM | POA: Diagnosis not present

## 2017-02-07 DIAGNOSIS — I10 Essential (primary) hypertension: Secondary | ICD-10-CM | POA: Diagnosis not present

## 2017-02-07 DIAGNOSIS — Z789 Other specified health status: Secondary | ICD-10-CM | POA: Diagnosis not present

## 2017-02-07 DIAGNOSIS — K219 Gastro-esophageal reflux disease without esophagitis: Secondary | ICD-10-CM | POA: Diagnosis not present

## 2017-02-07 DIAGNOSIS — M810 Age-related osteoporosis without current pathological fracture: Secondary | ICD-10-CM | POA: Diagnosis not present

## 2017-02-07 DIAGNOSIS — G4700 Insomnia, unspecified: Secondary | ICD-10-CM | POA: Diagnosis not present

## 2017-02-08 DIAGNOSIS — L239 Allergic contact dermatitis, unspecified cause: Secondary | ICD-10-CM | POA: Diagnosis not present

## 2017-02-08 DIAGNOSIS — Z86018 Personal history of other benign neoplasm: Secondary | ICD-10-CM | POA: Diagnosis not present

## 2017-02-08 DIAGNOSIS — D225 Melanocytic nevi of trunk: Secondary | ICD-10-CM | POA: Diagnosis not present

## 2017-02-08 DIAGNOSIS — Z808 Family history of malignant neoplasm of other organs or systems: Secondary | ICD-10-CM | POA: Diagnosis not present

## 2017-02-08 DIAGNOSIS — Z23 Encounter for immunization: Secondary | ICD-10-CM | POA: Diagnosis not present

## 2017-02-08 DIAGNOSIS — D2271 Melanocytic nevi of right lower limb, including hip: Secondary | ICD-10-CM | POA: Diagnosis not present

## 2017-02-08 DIAGNOSIS — D1801 Hemangioma of skin and subcutaneous tissue: Secondary | ICD-10-CM | POA: Diagnosis not present

## 2017-02-08 DIAGNOSIS — L821 Other seborrheic keratosis: Secondary | ICD-10-CM | POA: Diagnosis not present

## 2017-07-04 ENCOUNTER — Telehealth: Payer: Self-pay | Admitting: Cardiology

## 2017-07-04 NOTE — Telephone Encounter (Signed)
Called patient to schedule followup as patient has not been seen since 12-02-14 and the patient did not want to schedule an appointment.  She will call when she needs an appointment.

## 2017-07-05 ENCOUNTER — Other Ambulatory Visit (HOSPITAL_COMMUNITY): Payer: Self-pay | Admitting: *Deleted

## 2017-07-06 ENCOUNTER — Ambulatory Visit (HOSPITAL_COMMUNITY)
Admission: RE | Admit: 2017-07-06 | Discharge: 2017-07-06 | Disposition: A | Discharge status: Home / Self Care | Payer: Medicare Other | Source: Ambulatory Visit | Attending: Endocrinology | Admitting: Endocrinology

## 2017-07-06 DIAGNOSIS — M810 Age-related osteoporosis without current pathological fracture: Secondary | ICD-10-CM | POA: Diagnosis not present

## 2017-07-06 MED ORDER — DENOSUMAB 60 MG/ML ~~LOC~~ SOSY
60.0000 mg | PREFILLED_SYRINGE | Freq: Once | SUBCUTANEOUS | Status: AC
  Administered 2017-07-06: 60 mg via SUBCUTANEOUS
  Filled 2017-07-06: qty 1

## 2017-07-08 ENCOUNTER — Encounter (HOSPITAL_COMMUNITY): Payer: Medicare Other

## 2017-07-11 ENCOUNTER — Other Ambulatory Visit: Payer: Self-pay | Admitting: Obstetrics and Gynecology

## 2017-07-11 DIAGNOSIS — Z1231 Encounter for screening mammogram for malignant neoplasm of breast: Secondary | ICD-10-CM

## 2017-07-11 DIAGNOSIS — N390 Urinary tract infection, site not specified: Secondary | ICD-10-CM | POA: Diagnosis not present

## 2017-08-02 ENCOUNTER — Ambulatory Visit: Payer: Medicare Other

## 2017-08-02 DIAGNOSIS — J329 Chronic sinusitis, unspecified: Secondary | ICD-10-CM | POA: Diagnosis not present

## 2017-08-23 DIAGNOSIS — Z0189 Encounter for other specified special examinations: Secondary | ICD-10-CM | POA: Diagnosis not present

## 2017-08-23 DIAGNOSIS — I6523 Occlusion and stenosis of bilateral carotid arteries: Secondary | ICD-10-CM | POA: Diagnosis not present

## 2017-08-23 DIAGNOSIS — I10 Essential (primary) hypertension: Secondary | ICD-10-CM | POA: Diagnosis not present

## 2017-09-07 ENCOUNTER — Ambulatory Visit
Admission: RE | Admit: 2017-09-07 | Discharge: 2017-09-07 | Disposition: A | Discharge status: Home / Self Care | Payer: Medicare Other | Source: Ambulatory Visit | Attending: Obstetrics and Gynecology | Admitting: Obstetrics and Gynecology

## 2017-09-07 DIAGNOSIS — Z1231 Encounter for screening mammogram for malignant neoplasm of breast: Secondary | ICD-10-CM | POA: Diagnosis not present

## 2017-09-08 DIAGNOSIS — E7800 Pure hypercholesterolemia, unspecified: Secondary | ICD-10-CM | POA: Diagnosis not present

## 2017-10-19 DIAGNOSIS — Z23 Encounter for immunization: Secondary | ICD-10-CM | POA: Diagnosis not present

## 2017-11-10 DIAGNOSIS — R05 Cough: Secondary | ICD-10-CM | POA: Diagnosis not present

## 2017-11-10 DIAGNOSIS — J329 Chronic sinusitis, unspecified: Secondary | ICD-10-CM | POA: Diagnosis not present

## 2017-11-10 DIAGNOSIS — J309 Allergic rhinitis, unspecified: Secondary | ICD-10-CM | POA: Diagnosis not present

## 2017-11-10 DIAGNOSIS — I10 Essential (primary) hypertension: Secondary | ICD-10-CM | POA: Diagnosis not present

## 2017-11-28 DIAGNOSIS — I6523 Occlusion and stenosis of bilateral carotid arteries: Secondary | ICD-10-CM | POA: Diagnosis not present

## 2017-11-28 DIAGNOSIS — I10 Essential (primary) hypertension: Secondary | ICD-10-CM | POA: Diagnosis not present

## 2017-11-28 DIAGNOSIS — Z0189 Encounter for other specified special examinations: Secondary | ICD-10-CM | POA: Diagnosis not present

## 2017-12-05 DIAGNOSIS — E7800 Pure hypercholesterolemia, unspecified: Secondary | ICD-10-CM | POA: Diagnosis not present

## 2017-12-23 ENCOUNTER — Other Ambulatory Visit (HOSPITAL_COMMUNITY): Payer: Self-pay

## 2017-12-25 ENCOUNTER — Other Ambulatory Visit: Payer: Self-pay

## 2017-12-25 ENCOUNTER — Emergency Department (HOSPITAL_COMMUNITY): Payer: Medicare Other

## 2017-12-25 ENCOUNTER — Emergency Department (HOSPITAL_COMMUNITY)
Admission: EM | Admit: 2017-12-25 | Discharge: 2017-12-25 | Disposition: A | Discharge status: Home / Self Care | Payer: Medicare Other | Attending: Emergency Medicine | Admitting: Emergency Medicine

## 2017-12-25 ENCOUNTER — Encounter (HOSPITAL_COMMUNITY): Payer: Self-pay | Admitting: Emergency Medicine

## 2017-12-25 DIAGNOSIS — R079 Chest pain, unspecified: Secondary | ICD-10-CM | POA: Diagnosis not present

## 2017-12-25 DIAGNOSIS — I10 Essential (primary) hypertension: Secondary | ICD-10-CM | POA: Insufficient documentation

## 2017-12-25 DIAGNOSIS — Z79899 Other long term (current) drug therapy: Secondary | ICD-10-CM | POA: Insufficient documentation

## 2017-12-25 DIAGNOSIS — R0789 Other chest pain: Secondary | ICD-10-CM | POA: Diagnosis not present

## 2017-12-25 DIAGNOSIS — Z7982 Long term (current) use of aspirin: Secondary | ICD-10-CM | POA: Insufficient documentation

## 2017-12-25 LAB — BASIC METABOLIC PANEL
ANION GAP: 17 — AB (ref 5–15)
BUN: 23 mg/dL (ref 8–23)
CHLORIDE: 101 mmol/L (ref 98–111)
CO2: 20 mmol/L — ABNORMAL LOW (ref 22–32)
Calcium: 9.4 mg/dL (ref 8.9–10.3)
Creatinine, Ser: 0.86 mg/dL (ref 0.44–1.00)
GFR calc Af Amer: >60 mL/min (ref >60)
GLUCOSE: 141 mg/dL — AB (ref 70–99)
POTASSIUM: 3.6 mmol/L (ref 3.5–5.1)
SODIUM: 138 mmol/L (ref 135–145)

## 2017-12-25 LAB — CBC
HCT: 44.3 % (ref 36.0–46.0)
HEMOGLOBIN: 13.9 g/dL (ref 12.0–15.0)
MCH: 29.4 pg (ref 26.0–34.0)
MCHC: 31.4 g/dL (ref 30.0–36.0)
MCV: 93.7 fL (ref 80.0–100.0)
Platelets: 232 10*3/uL (ref 150–400)
RBC: 4.73 MIL/uL (ref 3.87–5.11)
RDW: 12.3 % (ref 11.5–15.5)
WBC: 8.5 10*3/uL (ref 4.0–10.5)
nRBC: 0 % (ref 0.0–0.2)

## 2017-12-25 LAB — I-STAT TROPONIN, ED: TROPONIN I, POC: 0.05 ng/mL (ref 0.00–0.08)

## 2017-12-25 LAB — D-DIMER, QUANTITATIVE: D-Dimer, Quant: 0.48 ug/mL-FEU (ref 0.00–0.50)

## 2017-12-25 MED ORDER — SODIUM CHLORIDE 0.9 % IV BOLUS
1000.0000 mL | Freq: Once | INTRAVENOUS | Status: AC
  Administered 2017-12-25: 1000 mL via INTRAVENOUS

## 2017-12-25 MED ORDER — FAMOTIDINE 20 MG PO TABS
20.0000 mg | ORAL_TABLET | Freq: Once | ORAL | Status: AC
Start: 2017-12-25 — End: 2017-12-25
  Administered 2017-12-25: 20 mg via ORAL
  Filled 2017-12-25: qty 1

## 2017-12-25 MED ORDER — ASPIRIN 325 MG PO TABS
325.0000 mg | ORAL_TABLET | Freq: Once | ORAL | Status: AC
  Administered 2017-12-25: 325 mg via ORAL
  Filled 2017-12-25: qty 1

## 2017-12-25 NOTE — ED Notes (Signed)
D/c reviewed with patient and family. Encouraged to follow up with Cardiologist as ordered

## 2017-12-25 NOTE — ED Provider Notes (Addendum)
[facility]GENC[facility] DEPARTMENT Provider Note   CSN: [id number] Arrival date & time: 12/25/17  0547     History   Chief Complaint Chief Complaint  Patient presents with  . Chest Pain    HPI Deborah Mcmillan is a 83 y.o. female.  Deborah L Boda is a 83 y.o. female with a history of paroxysmal A. fib, dyslipidemia, GERD, hypertension, osteoporosis and seasonal allergies, who presents to the emergency department for evaluation of chest pain.  She reports around 4 AM this morning she noticed some right-sided chest pain she describes it as a nagging pressure.  She is unsure if this woke her up from sleep or if she noticed it while she was awake.  She reports that it felt like she needed to burp so she drank some Sprite was able to release a small amount of gas but pain continued.  She reports since then she has had intermittent 5-10 episodes of this right-sided chest pain, pain is nonradiating, no pain in the arm neck or jaw.  She denies any associated diaphoresis, nausea or vomiting.  She has not felt short of breath or lightheaded.  She reports history of GERD but has not had pain like this in the past with her acid reflux.  No associated abdominal pain.  No lower extremity swelling or pain.  Patient denies any history of cardiac events ever since she was diagnosed with postoperative A. fib she is followed with Dr. Einar Gip with cardiology but has no history of MI.  Does not take a daily aspirin due to GI upset, not on any blood thinners, denies history of PE or DVT.  No recent long distance travel or hospitalizations.     Past Medical History:  Diagnosis Date  . Dyslipidemia   . Dysrhythmia    h/o atrial couplets with short PAT, h/o a-fib during surgery (event monitor)  . GERD (gastroesophageal reflux disease)   . History of nuclear stress test 05/2009   dipyridamole; normal, low risk   . Hypertension   . Osteoporosis   . Postoperative atrial fibrillation ([place])  2011    . Seasonal allergies     Patient Active Problem List   Diagnosis Date Noted  . Preoperative cardiovascular examination 11/14/2012  . Postoperative atrial fibrillation ([place])   . Essential hypertension   . Dyslipidemia   . Paroxysmal a-fib - only 1 episode documented, post-op 11/14/2009    Past Surgical History:  Procedure Laterality Date  . APPENDECTOMY  1961  . BLADDER SURGERY  2002  . COSMETIC SURGERY  2011   1 episode of atrial fibrillation during this   . HERNIA REPAIR  1999   hiatal  . lump removal  05/06/2009   [facility]  . NM MYOVIEW LTD  May 2011   No ischemia or infarction.  Marland Kitchen SHOULDER SURGERY Left Feb 2015   Dr. Veverly Fells - arthroscopy (rotator cuff ?debridement plus biceps tenodesis)  . TRANSTHORACIC ECHOCARDIOGRAM  05/2009   EF=>55%, with normal LV systolic function, impaired LV relaxation; trace MR/TR; mild AV regurg     OB History   No obstetric history on file.      Home Medications    Prior to Admission medications   Medication Sig Start Date End Date Taking? Authorizing Provider  albuterol (PROVENTIL) (2.5 MG/3ML) 0.083% nebulizer solution Take 2.5 mg by nebulization every 6 (six) hours as needed for wheezing.    [provider]  aspirin 81 MG chewable tablet Chew 81 mg by mouth  daily.    [provider]  B Complex-C (SUPER B COMPLEX PO) Take by mouth.    [provider]  Calcium 150 MG TABS Take 1 tablet by mouth daily.    [provider]  Cholecalciferol (VITAMIN D) 2000 UNITS CAPS Take 1 capsule by mouth daily.    [provider]  fluticasone (FLONASE) 50 MCG/ACT nasal spray Place 1-2 sprays into the nose as needed.     [provider]  ipratropium (ATROVENT) 0.06 % nasal spray Place 1 spray into the nose as needed. 09/04/14   [provider]  Magnesium 400 MG CAPS Take 400-800 mg by mouth daily.    [provider]  metoprolol succinate (TOPROL-XL) 100 MG 24 hr tablet TAKE 1 TABLET BY  MOUTH EVERY DAY 10/20/15   Leonie Man, MD  mometasone (NASONEX) 50 MCG/ACT nasal spray Place 2 sprays into the nose as needed. 10/20/14   [provider]  Multiple Vitamin (MULTIVITAMIN) capsule Take 1 capsule by mouth daily.    [provider]  Omega-3 Fatty Acids (FISH OIL PO) Take by mouth daily.    [provider]  omeprazole (PRILOSEC) 20 MG capsule Take 20 mg by mouth daily.    [provider]  OVER THE COUNTER MEDICATION Q10 (qunol ) 200 mg a daily    [provider]  pramipexole (MIRAPEX) 0.125 MG tablet Take 1 tablet by mouth 2 (two) times daily. For 30 days take 1 tablet at bedtime, then start twice a day 11/06/13   [provider]  spironolactone (ALDACTONE) 25 MG tablet Take 25 mg by mouth daily.    [provider]  zolpidem (AMBIEN) 5 MG tablet Take 5 mg by mouth at bedtime as needed for sleep.    [provider]    Family History Family History  Problem Relation Age of Onset  . Cancer Father   . Cancer Brother   . Breast cancer Neg Hx     Social History Social History   Tobacco Use  . Smoking status: Never Smoker  . Smokeless tobacco: Never Used  Substance Use Topics  . Alcohol use: No  . Drug use: No     Allergies   Calcium-containing compounds; Magnesium-containing compounds; Tape; Codeine; Crestor [rosuvastatin]; Lodine [etodolac]; Penicillins; Reclast [zoledronic acid]; Sulfa antibiotics; Welchol [colesevelam]; Zetia [ezetimibe]; Niacin and related; and Tetracyclines & related   Review of Systems Review of Systems  Constitutional: Negative for chills and fever.  HENT: Negative for congestion, rhinorrhea and sore throat.   Eyes: Negative for visual disturbance.  Respiratory: Negative for cough, chest tightness, shortness of breath and wheezing.   Cardiovascular: Positive for chest pain. Negative for palpitations and leg swelling.  Gastrointestinal: Negative for abdominal pain,  diarrhea, nausea and vomiting.  Genitourinary: Negative for dysuria, flank pain and frequency.  Musculoskeletal: Negative for arthralgias and myalgias.  Skin: Negative for color change and rash.  Neurological: Negative for dizziness, syncope and light-headedness.     Physical Exam Updated Vital Signs BP 137/60   Pulse 67   Temp 98.2 F (36.8 C) (Oral)   Resp 18   SpO2 97%   Physical Exam Vitals signs and nursing note reviewed.  Constitutional:      General: She is not in acute distress.    Appearance: She is well-developed. She is not ill-appearing or diaphoretic.  HENT:     Head: Normocephalic and atraumatic.  Eyes:     General:  Right eye: No discharge.        Left eye: No discharge.  Neck:     Musculoskeletal: Neck supple.     Thyroid: No thyromegaly.     Trachea: No tracheal deviation.  Cardiovascular:     Rate and Rhythm: Normal rate and regular rhythm.     Pulses:          Radial pulses are 2+ on the right side and 2+ on the left side.       Dorsalis pedis pulses are 2+ on the right side and 2+ on the left side.       Posterior tibial pulses are 2+ on the right side and 2+ on the left side.     Heart sounds: Normal heart sounds. No murmur. No friction rub. No gallop.   Pulmonary:     Effort: Pulmonary effort is normal. No respiratory distress.     Breath sounds: Normal breath sounds.     Comments: Respirations equal and unlabored, patient able to speak in full sentences, lungs clear to auscultation bilaterally Chest:     Chest wall: No tenderness.  Abdominal:     General: Bowel sounds are normal. There is no abdominal bruit.     Palpations: Abdomen is soft. There is no hepatomegaly or mass.     Tenderness: There is no guarding.     Comments: Abdomen soft, nondistended, nontender to palpation in all quadrants without guarding or peritoneal signs  Musculoskeletal:     Right lower leg: She exhibits no tenderness. No edema.     Left lower leg: She exhibits  no tenderness. No edema.  Skin:    General: Skin is warm and dry.     Capillary Refill: Capillary refill takes less than 2 seconds.  Neurological:     Mental Status: She is alert and oriented to person, place, and time.     Coordination: Coordination normal.  Psychiatric:        Mood and Affect: Mood normal.        Behavior: Behavior normal.      ED Treatments / Results  Labs (all labs ordered are listed, but only abnormal results are displayed) Labs Reviewed  BASIC METABOLIC PANEL - Abnormal; Notable for the following components:      Result Value   CO2 20 (*)    Glucose, Bld 141 (*)    Anion gap 17 (*)    All other components within normal limits  CBC  D-DIMER, QUANTITATIVE (NOT AT [facility])  I-STAT TROPONIN, ED    EKG EKG Interpretation  Date/Time:  Sunday December 25 2017 06:21:47 EST Ventricular Rate:  74 PR Interval:    QRS Duration: 89 QT Interval:  375 QTC Calculation: 416 R Axis:   47 Text Interpretation:  Sinus rhythm Borderline prolonged PR interval Abnormal R-wave progression, early transition Confirmed by Orpah Greek [phone]) on 12/25/2017 7:24:58 AM   Radiology Dg Chest 2 View  Result Date: 12/25/2017 CLINICAL DATA:  Awoke from sleep with chest pain. EXAM: CHEST - 2 VIEW COMPARISON:  06/03/2015 FINDINGS: The cardiomediastinal contours are normal. Aortic atherosclerosis. Increased AP diameter chest, unchanged. Chronic bronchitic change. Pulmonary vasculature is normal. No consolidation, pleural effusion, or pneumothorax. No acute osseous abnormalities are seen. IMPRESSION: Chronic bronchitic change without acute abnormality. Aortic Atherosclerosis (ICD10-I70.0). Electronically Signed   By: Keith Rake M.D.   On: 12/25/2017 07:00    Procedures Procedures (including critical care time)  Medications Ordered in ED Medications  sodium chloride  0.9 % bolus 1,000 mL (1,000 mLs Intravenous New Bag/Given 12/25/17 0749)  aspirin tablet 325 mg (325  mg Oral Given 12/25/17 0650)  famotidine (PEPCID) tablet 20 mg (20 mg Oral Given 12/25/17 0650)     Initial Impression / Assessment and Plan / ED Course  I have reviewed the triage vital signs and the nursing notes.  Pertinent labs & imaging results that were available during my care of the patient were reviewed by me and considered in my medical decision making (see chart for details).  Patient presents with chest pain starting at 4 AM this morning, it is intermittent with brief episodes of right-sided pain that is described as a dull ache, nonradiating, not exertional, no associated diaphoresis, nausea, vomiting, lightheadedness, shortness of breath.  Symptoms seem somewhat atypical for cardiac pain patient's primary risk factor for PE is her age, she has not had any lower extremity swelling, no recent surgeries or hospitalizations.  Will check basic labs, troponin, d-dimer, chest x-ray and EKG, will give aspirin and Pepcid.  Patient does have history of reflux and I wonder if this could be contributing to patient's symptoms.  Especially since when it started she felt like she needed to belch.  Story not concerning for aortic dissection.  EKG without concerning ischemic changes, initial troponin is negative.  X-ray shows some chronic bronchitic changes but no acute cardiopulmonary disease.  There is no leukocytosis and normal hemoglobin, electrolyte panel shows glucose of 141, CO2 is 20 and patient does have a slight anion gap of 17, no clear cause for patient's anion gap, she is very well appearing, I suspect she may be slightly dehydrated will give 1 L of IV fluids.  She has not had any further episodes of chest pain since coming into the ED.  7:31 AM Case discussed with Dr. Virgina Jock (Dr. Irven Shelling partner) with cardiology who agrees with work-up here in the emergency department and thinks patient is likely stable for discharge home, no further work-up required here today, will arrange close  follow-up in the office with potential stress testing this week.  Remains chest pain-free, at this time I feel she is stable for discharge home with close follow-up with cardiology will have her follow-up for recheck of electrolytes at this time as well.  Do not feel that any further evaluation or intervention is necessary at this time in the emergency department.  Have discussed appropriate return precautions, patient expresses understanding and is in agreement with plan.  Patient discussed with Dr. Betsey Holiday, who saw patient as well and agrees with plan.   Final Clinical Impressions(s) / ED Diagnoses   Final diagnoses:  Atypical chest pain    ED Discharge Orders    None       Jacqlyn Larsen, [place] 12/25/17 0824    Jacqlyn Larsen, PA-C 12/25/17 0825    Orpah Greek, MD 12/26/17 [phone]

## 2017-12-25 NOTE — ED Provider Notes (Signed)
Patient presented to the ER with chest pain.  Patient experiencing intermittent episodes of right-sided chest pain.  Symptoms come and go, only last a couple of minutes at a time.  Patient denies any trauma to the chest.  She has not identified anything that causes the pain or makes it better when it is present.  Face to face Exam: HEENT - PERRLA Lungs - CTAB Heart - RRR, no M/R/G Abd - S/NT/ND Neuro - alert, oriented x3  Plan: Patient with no known history of coronary artery disease presents with very atypical right-sided intermittent chest pain.  Episodes are brief in nature and occur randomly.  Obtain initial cardiac work-up, d-dimer, likely follow-up with her cardiologist.   Orpah Greek, MD 12/25/17 [phone]

## 2017-12-25 NOTE — ED Notes (Signed)
Patient transported to X-ray 

## 2017-12-25 NOTE — ED Triage Notes (Signed)
Pt from home. Awoke from sleep this morning with right-sided chest pain. Unable to recall if she was awake or was awakened by pain. Worse lying down. Felt she needed to "burp" Drank a sprite, went to the restroom and continued to have nagging pain, non radiating, no n/v, shob, or diaphoresis. Son was getting ready for work and insisted she come to get checked out. Hx of afib and is followed by cardiology.

## 2017-12-25 NOTE — Discharge Instructions (Signed)
Your work-up today is reassuring and does not suggest an acute problem with your heart or lungs causing her symptoms today.  Could possibly be due to acid reflux.  Please call to schedule follow-up appointment with Dr. Einar Gip as well as with your primary care doctor.  I would like for you to have your electrolytes rechecked in the next week as well.  Return to the emergency department if you have worsening or persistent chest pain, if it radiates to the arm neck or jaw or is worse with exertion, he feels short of breath or lightheaded like you may pass out or any other new or concerning symptoms occur.

## 2017-12-26 ENCOUNTER — Inpatient Hospital Stay (HOSPITAL_COMMUNITY): Admission: RE | Admit: 2017-12-26 | Payer: Medicare Other | Source: Ambulatory Visit

## 2017-12-28 DIAGNOSIS — Z09 Encounter for follow-up examination after completed treatment for conditions other than malignant neoplasm: Secondary | ICD-10-CM | POA: Diagnosis not present

## 2017-12-28 DIAGNOSIS — K219 Gastro-esophageal reflux disease without esophagitis: Secondary | ICD-10-CM | POA: Diagnosis not present

## 2018-01-02 DIAGNOSIS — I6523 Occlusion and stenosis of bilateral carotid arteries: Secondary | ICD-10-CM | POA: Diagnosis not present

## 2018-01-02 DIAGNOSIS — I10 Essential (primary) hypertension: Secondary | ICD-10-CM | POA: Diagnosis not present

## 2018-01-02 DIAGNOSIS — Z789 Other specified health status: Secondary | ICD-10-CM | POA: Diagnosis not present

## 2018-01-02 DIAGNOSIS — E7800 Pure hypercholesterolemia, unspecified: Secondary | ICD-10-CM | POA: Diagnosis not present

## 2018-01-12 DIAGNOSIS — J45909 Unspecified asthma, uncomplicated: Secondary | ICD-10-CM | POA: Diagnosis not present

## 2018-01-12 DIAGNOSIS — J069 Acute upper respiratory infection, unspecified: Secondary | ICD-10-CM | POA: Diagnosis not present

## 2018-01-31 ENCOUNTER — Ambulatory Visit (HOSPITAL_COMMUNITY)
Admission: RE | Admit: 2018-01-31 | Discharge: 2018-01-31 | Disposition: A | Discharge status: Home / Self Care | Payer: Medicare Other | Source: Ambulatory Visit | Attending: Endocrinology | Admitting: Endocrinology

## 2018-01-31 DIAGNOSIS — M810 Age-related osteoporosis without current pathological fracture: Secondary | ICD-10-CM | POA: Diagnosis not present

## 2018-01-31 MED ORDER — DENOSUMAB 60 MG/ML ~~LOC~~ SOSY
60.0000 mg | PREFILLED_SYRINGE | Freq: Once | SUBCUTANEOUS | Status: AC
  Administered 2018-01-31: 60 mg via SUBCUTANEOUS

## 2018-01-31 MED ORDER — DENOSUMAB 60 MG/ML ~~LOC~~ SOSY
PREFILLED_SYRINGE | SUBCUTANEOUS | Status: AC
  Administered 2018-01-31: 60 mg via SUBCUTANEOUS
  Filled 2018-01-31: qty 1

## 2018-02-01 DIAGNOSIS — I10 Essential (primary) hypertension: Secondary | ICD-10-CM | POA: Diagnosis not present

## 2018-02-01 DIAGNOSIS — E559 Vitamin D deficiency, unspecified: Secondary | ICD-10-CM | POA: Diagnosis not present

## 2018-02-01 DIAGNOSIS — E789 Disorder of lipoprotein metabolism, unspecified: Secondary | ICD-10-CM | POA: Diagnosis not present

## 2018-02-03 DIAGNOSIS — L821 Other seborrheic keratosis: Secondary | ICD-10-CM | POA: Diagnosis not present

## 2018-02-03 DIAGNOSIS — D225 Melanocytic nevi of trunk: Secondary | ICD-10-CM | POA: Diagnosis not present

## 2018-02-03 DIAGNOSIS — D2271 Melanocytic nevi of right lower limb, including hip: Secondary | ICD-10-CM | POA: Diagnosis not present

## 2018-02-03 DIAGNOSIS — L239 Allergic contact dermatitis, unspecified cause: Secondary | ICD-10-CM | POA: Diagnosis not present

## 2018-02-03 DIAGNOSIS — Z808 Family history of malignant neoplasm of other organs or systems: Secondary | ICD-10-CM | POA: Diagnosis not present

## 2018-02-03 DIAGNOSIS — Z86018 Personal history of other benign neoplasm: Secondary | ICD-10-CM | POA: Diagnosis not present

## 2018-02-03 DIAGNOSIS — Z23 Encounter for immunization: Secondary | ICD-10-CM | POA: Diagnosis not present

## 2018-02-13 DIAGNOSIS — M810 Age-related osteoporosis without current pathological fracture: Secondary | ICD-10-CM | POA: Diagnosis not present

## 2018-02-13 DIAGNOSIS — Z0000 Encounter for general adult medical examination without abnormal findings: Secondary | ICD-10-CM | POA: Diagnosis not present

## 2018-02-13 DIAGNOSIS — E7800 Pure hypercholesterolemia, unspecified: Secondary | ICD-10-CM | POA: Diagnosis not present

## 2018-03-23 ENCOUNTER — Other Ambulatory Visit: Payer: Self-pay | Admitting: Cardiology

## 2018-04-06 DIAGNOSIS — R300 Dysuria: Secondary | ICD-10-CM | POA: Diagnosis not present

## 2018-04-20 ENCOUNTER — Other Ambulatory Visit: Payer: Self-pay | Admitting: Cardiology

## 2018-05-16 ENCOUNTER — Other Ambulatory Visit: Payer: Self-pay | Admitting: Cardiology

## 2018-08-07 ENCOUNTER — Other Ambulatory Visit (HOSPITAL_COMMUNITY): Payer: Self-pay | Admitting: *Deleted

## 2018-08-08 ENCOUNTER — Other Ambulatory Visit: Payer: Self-pay

## 2018-08-08 ENCOUNTER — Ambulatory Visit (HOSPITAL_COMMUNITY)
Admission: RE | Admit: 2018-08-08 | Discharge: 2018-08-08 | Disposition: A | Discharge status: Home / Self Care | Payer: Medicare Other | Source: Ambulatory Visit | Attending: Endocrinology | Admitting: Endocrinology

## 2018-08-08 DIAGNOSIS — M810 Age-related osteoporosis without current pathological fracture: Secondary | ICD-10-CM | POA: Insufficient documentation

## 2018-08-08 MED ORDER — DENOSUMAB 60 MG/ML ~~LOC~~ SOSY
PREFILLED_SYRINGE | SUBCUTANEOUS | Status: AC
  Administered 2018-08-08: 14:00:00 60 mg via SUBCUTANEOUS
  Filled 2018-08-08: qty 1

## 2018-08-08 MED ORDER — DENOSUMAB 60 MG/ML ~~LOC~~ SOSY
60.0000 mg | PREFILLED_SYRINGE | Freq: Once | SUBCUTANEOUS | Status: AC
  Administered 2018-08-08: 14:00:00 60 mg via SUBCUTANEOUS

## 2018-08-31 DIAGNOSIS — E7800 Pure hypercholesterolemia, unspecified: Secondary | ICD-10-CM | POA: Diagnosis not present

## 2018-09-22 DIAGNOSIS — Z23 Encounter for immunization: Secondary | ICD-10-CM | POA: Diagnosis not present

## 2018-10-13 ENCOUNTER — Other Ambulatory Visit: Payer: Self-pay | Admitting: Cardiology

## 2018-11-16 ENCOUNTER — Other Ambulatory Visit: Payer: Self-pay | Admitting: Cardiology

## 2018-11-27 ENCOUNTER — Ambulatory Visit: Payer: BLUE CROSS/BLUE SHIELD | Admitting: Cardiology

## 2019-01-25 DIAGNOSIS — M25552 Pain in left hip: Secondary | ICD-10-CM | POA: Diagnosis not present

## 2019-01-25 DIAGNOSIS — M545 Low back pain: Secondary | ICD-10-CM | POA: Diagnosis not present

## 2019-01-30 ENCOUNTER — Other Ambulatory Visit: Payer: Self-pay | Admitting: Obstetrics and Gynecology

## 2019-01-30 ENCOUNTER — Other Ambulatory Visit: Payer: Self-pay | Admitting: Internal Medicine

## 2019-01-30 DIAGNOSIS — Z1231 Encounter for screening mammogram for malignant neoplasm of breast: Secondary | ICD-10-CM

## 2019-02-06 DIAGNOSIS — M545 Low back pain: Secondary | ICD-10-CM | POA: Diagnosis not present

## 2019-02-10 ENCOUNTER — Other Ambulatory Visit: Payer: Self-pay | Admitting: Cardiology

## 2019-02-12 DIAGNOSIS — E789 Disorder of lipoprotein metabolism, unspecified: Secondary | ICD-10-CM | POA: Diagnosis not present

## 2019-02-12 DIAGNOSIS — E559 Vitamin D deficiency, unspecified: Secondary | ICD-10-CM | POA: Diagnosis not present

## 2019-02-12 DIAGNOSIS — Z0000 Encounter for general adult medical examination without abnormal findings: Secondary | ICD-10-CM | POA: Diagnosis not present

## 2019-02-12 DIAGNOSIS — E7800 Pure hypercholesterolemia, unspecified: Secondary | ICD-10-CM | POA: Diagnosis not present

## 2019-02-19 DIAGNOSIS — R259 Unspecified abnormal involuntary movements: Secondary | ICD-10-CM | POA: Diagnosis not present

## 2019-02-19 DIAGNOSIS — Z0000 Encounter for general adult medical examination without abnormal findings: Secondary | ICD-10-CM | POA: Diagnosis not present

## 2019-02-19 DIAGNOSIS — J45909 Unspecified asthma, uncomplicated: Secondary | ICD-10-CM | POA: Diagnosis not present

## 2019-02-19 DIAGNOSIS — I10 Essential (primary) hypertension: Secondary | ICD-10-CM | POA: Diagnosis not present

## 2019-02-19 DIAGNOSIS — M810 Age-related osteoporosis without current pathological fracture: Secondary | ICD-10-CM | POA: Diagnosis not present

## 2019-02-19 DIAGNOSIS — E7800 Pure hypercholesterolemia, unspecified: Secondary | ICD-10-CM | POA: Diagnosis not present

## 2019-02-19 DIAGNOSIS — K219 Gastro-esophageal reflux disease without esophagitis: Secondary | ICD-10-CM | POA: Diagnosis not present

## 2019-03-19 ENCOUNTER — Other Ambulatory Visit: Payer: Self-pay

## 2019-03-19 ENCOUNTER — Ambulatory Visit
Admission: RE | Admit: 2019-03-19 | Discharge: 2019-03-19 | Disposition: A | Discharge status: Home / Self Care | Payer: Medicare Other | Source: Ambulatory Visit | Attending: Internal Medicine | Admitting: Internal Medicine

## 2019-03-19 DIAGNOSIS — Z1231 Encounter for screening mammogram for malignant neoplasm of breast: Secondary | ICD-10-CM | POA: Diagnosis not present

## 2019-03-26 ENCOUNTER — Ambulatory Visit: Payer: Medicare Other | Admitting: Neurology

## 2019-04-18 ENCOUNTER — Ambulatory Visit: Payer: Medicare Other | Admitting: Neurology

## 2019-04-30 ENCOUNTER — Ambulatory Visit: Payer: Medicare Other | Admitting: Cardiology

## 2019-04-30 ENCOUNTER — Other Ambulatory Visit: Payer: Self-pay

## 2019-04-30 ENCOUNTER — Encounter: Payer: Self-pay | Admitting: Cardiology

## 2019-04-30 VITALS — BP 142/74 | HR 76 | Temp 97.6°F | Ht 60.0 in | Wt 186.0 lb

## 2019-04-30 DIAGNOSIS — I6523 Occlusion and stenosis of bilateral carotid arteries: Secondary | ICD-10-CM | POA: Diagnosis not present

## 2019-04-30 DIAGNOSIS — R0609 Other forms of dyspnea: Secondary | ICD-10-CM | POA: Diagnosis not present

## 2019-04-30 DIAGNOSIS — I10 Essential (primary) hypertension: Secondary | ICD-10-CM

## 2019-04-30 DIAGNOSIS — E7800 Pure hypercholesterolemia, unspecified: Secondary | ICD-10-CM

## 2019-04-30 DIAGNOSIS — Z789 Other specified health status: Secondary | ICD-10-CM | POA: Diagnosis not present

## 2019-04-30 MED ORDER — AMLODIPINE BESYLATE 5 MG PO TABS: 5.0000 mg | ORAL_TABLET | Freq: Every day | ORAL | 2 refills | Status: DC

## 2019-04-30 MED ORDER — PRALUENT 75 MG/ML ~~LOC~~ SOAJ: 1.0000 "pen " | SUBCUTANEOUS | 4 refills | Status: DC

## 2019-04-30 NOTE — Progress Notes (Signed)
Primary Physician/Referring:  Deland Pretty, MD  Patient ID: Deborah Mcmillan, female    DOB: 17-Dec-1934, 85 y.o.   MRN: [id number]  Chief Complaint  Patient presents with  . Shortness of Breath  . Hyperlipidemia  . Hypertension   HPI:    Deborah Mcmillan  is a 85 y.o. Caucasian female with carotid atheresclerosis and hypertension and hyperlipidemia, LDL in the range of 170-190, however she has not been able to tolerate any statins. She was on a statin intolerant trial until December 2017 when she had developed severe rash throughout her body felt to be drug related rash and this was discontinued and now on Praluent. She has bronchial asthma and multiple medication allergies and intolerances.   She was seen by her PCP, Lilyan Gilford, NP and due to gradually worsening dyspnea, she was referred back to see me.  She is also noticed her blood pressure to be elevated.  No chest pain, she was started on bronchodilator therapy and has noticed improvement in dyspnea.  No leg edema, no PND or orthopnea.  Past Medical History:  Diagnosis Date  . Dyslipidemia   . Dysrhythmia    h/o atrial couplets with short PAT, h/o a-fib during surgery (event monitor)  . GERD (gastroesophageal reflux disease)   . History of nuclear stress test 05/2009   dipyridamole; normal, low risk   . Hypertension   . Osteoporosis   . Postoperative atrial fibrillation ([place])  2011  . Seasonal allergies    Past Surgical History:  Procedure Laterality Date  . APPENDECTOMY  1961  . BLADDER SURGERY  2002  . COSMETIC SURGERY  2011   1 episode of atrial fibrillation during this   . HERNIA REPAIR  1999   hiatal  . lump removal  05/06/2009   [facility]  . NM MYOVIEW LTD  May 2011   No ischemia or infarction.  Marland Kitchen SHOULDER SURGERY Left Feb 2015   Dr. Veverly Fells - arthroscopy (rotator cuff ?debridement plus biceps tenodesis)  . TRANSTHORACIC ECHOCARDIOGRAM  05/2009   EF=>55%, with normal LV systolic function, impaired LV relaxation;  trace MR/TR; mild AV regurg   Family History  Problem Relation Age of Onset  . Cancer Father   . Cancer Brother   . Breast cancer Neg Hx     Social History   Tobacco Use  . Smoking status: Never Smoker  . Smokeless tobacco: Never Used  Substance Use Topics  . Alcohol use: No   ROS  Review of Systems  Cardiovascular: Positive for dyspnea on exertion. Negative for chest pain and leg swelling.  Gastrointestinal: Negative for melena.   Objective  Blood pressure (!) 142/74, pulse 76, temperature 97.6 F (36.4 C), height 5' (1.524 m), weight 186 lb (84.4 kg), SpO2 96 %.  Vitals with BMI 04/30/2019 08/08/2018 01/31/2018  Height '5\' 0"'$  - -  Weight 186 lbs - -  BMI 10.93 - -  Systolic 235 573 220  Diastolic 74 49 53  Pulse 76 69 67     Physical Exam  Cardiovascular: Normal rate, regular rhythm, normal heart sounds and intact distal pulses. Exam reveals no gallop.  No murmur heard. Pulses:      Carotid pulses are on the right side with bruit and on the left side with bruit. No leg edema, no JVD.  Pulmonary/Chest: Effort normal and breath sounds normal.  Abdominal: Soft. Bowel sounds are normal.   Laboratory examination:   No results for input(s): NA, K, CL, CO2, GLUCOSE, BUN,  CREATININE, CALCIUM, GFRNONAA, GFRAA in the last 8760 hours. CrCl cannot be calculated (Patient's most recent lab result is older than the maximum 21 days allowed.).  CMP Latest Ref Rng & Units 12/25/2017  Glucose 70 - 99 mg/dL 141(H)  BUN 8 - 23 mg/dL 23  Creatinine 0.44 - 1.00 mg/dL 0.86  Sodium 135 - 145 mmol/L 138  Potassium 3.5 - 5.1 mmol/L 3.6  Chloride 98 - 111 mmol/L 101  CO2 22 - 32 mmol/L 20(L)  Calcium 8.9 - 10.3 mg/dL 9.4   CBC Latest Ref Rng & Units 12/25/2017  WBC 4.0 - 10.5 K/uL 8.5  Hemoglobin 12.0 - 15.0 g/dL 13.9  Hematocrit 36.0 - 46.0 % 44.3  Platelets 150 - 400 K/uL 232   External labs:   Lab 02/0/2021: Hb 14.6/HCT 43.1, platelets 235. Serum glucose 110 mg, sodium 142,  potassium 4.0, BUN 24, creatinine 0.9, EGFR >60 mL.  CMP otherwise normal.  Total cholesterol 155, triglycerides 133, HDL 64, LDL 64.  Non-HDL cholesterol 91.   02/01/2017: CBC normal. Creatinine 0.8, EGFR 74/90, potassium 3.8, CMP normal.  Cholesterol 230, triglycerides 114, HDL 64, LDL 143. TSH normal.  Medications and allergies   Allergies  Allergen Reactions  . Calcium-Containing Compounds Other (See Comments)    Muscle cramping  . Magnesium-Containing Compounds Itching and Rash  . Tape Itching and Rash    Plastic, and "brown tape"  . Codeine     Pain & upset stomach  . Crestor [Rosuvastatin]     Statin intolerance  . Lodine [Etodolac]     Upset stomach  . Penicillins   . Reclast [Zoledronic Acid] Other (See Comments)    Caused severe jaw pain  . Sulfa Antibiotics     Pain & upset stomach  . Welchol [Colesevelam] Nausea Only and Other (See Comments)    Stomach did not feel good  . Zetia [Ezetimibe]   . Niacin And Related Rash    "on fire"  . Tetracyclines & Related Rash    Upset stomach     Current Outpatient Medications  Medication Instructions  . Alirocumab (PRALUENT) 75 MG/ML SOAJ 1 pen, Subcutaneous, Every 14 days  . amLODipine (NORVASC) 5 mg, Oral, Daily after supper  . ASMANEX HFA 100 MCG/ACT AERO SMARTSIG:2 Puff(s) By Mouth Twice Daily  . Cholecalciferol (VITAMIN D) 2000 UNITS CAPS 1 capsule, Daily  . denosumab (PROLIA) 60 mg, Subcutaneous, Every 6 months  . Magnesium 400-800 mg, Daily  . metoprolol succinate (TOPROL-XL) 100 mg, Oral, Every evening, Please schedule appointment for refills  . omeprazole (PRILOSEC) 20 mg, Daily  . OVER THE COUNTER MEDICATION Q10 (qunol ) 200 mg a daily  . spironolactone (ALDACTONE) 50 MG tablet TAKE 1 TABLET BY MOUTH EVERY MORNING   Radiology:   No results found.  Cardiac Studies:   Carotid artery duplex 10/09/15: No hemodynamically significant arterial disease in the internal carotid artery bilaterally. Minimal mixed  plaque noted. Antegrade vertebral artery flow.  Nuclear stress test  [12/11/2015]:  1. The resting electrocardiogram demonstrated normal sinus rhythm, normal resting conduction, no resting arrhythmias and normal rest repolarization. Stress EKG is non-diagnostic for ischemia as it a pharmacologic stress using Lexiscan. Stress symptoms included dyspnea. 2. Myocardial perfusion imaging is normal. Overall left ventricular systolic function was normal without regional wall motion abnormalities. The left ventricular ejection fraction was 58%.  Echocardiogram  [12/09/2015]:  Left ventricle cavity is normal in size. Normal global wall motion. Normal diastolic filling pattern, normal LAP. Calculated EF 66%. Left atrial  cavity is mildly dilated at 4.2 cm. Mild to moderate aortic regurgitation. Mild to at most moderate central mitral regurgitation. Mild tricuspid regurgitation. No evidence of pulmonary hypertension.  EKG  EKG 04/30/2019: Normal sinus rhythm with rate of 66 bpm, left atrial enlargement, normal axis.  No evidence of ischemia otherwise normal EKG.  No significant change from EKG 01/02/2018.  Assessment     ICD-10-CM   1. Dyspnea on exertion  R06.00   2. Hypercholesteremia  E78.00   3. Atherosclerosis of both carotid arteries  I65.23   4. Essential hypertension  I10 EKG 12-Lead    amLODipine (NORVASC) 5 MG tablet  5. Statin intolerance  Z78.9      Meds ordered this encounter  Medications  . amLODipine (NORVASC) 5 MG tablet    Sig: Take 1 tablet (5 mg total) by mouth daily after supper.    Dispense:  30 tablet    Refill:  2  . Alirocumab (PRALUENT) 75 MG/ML SOAJ    Sig: Inject 1 pen into the skin every 14 (fourteen) days.    Dispense:  6 pen    Refill:  4    Medications Discontinued During This Encounter  Medication Reason  . aspirin 81 MG chewable tablet Side effect (s)  . albuterol (PROVENTIL) (2.5 MG/3ML) 0.083% nebulizer solution Patient Preference  . Calcium 150 MG  TABS Error  . B Complex-C (SUPER B COMPLEX PO) Patient Preference  . ipratropium (ATROVENT) 0.06 % nasal spray Completed Course  . fluticasone (FLONASE) 50 MCG/ACT nasal spray Completed Course  . Multiple Vitamin (MULTIVITAMIN) capsule Patient Preference  . mometasone (NASONEX) 50 MCG/ACT nasal spray Patient Preference  . zolpidem (AMBIEN) 5 MG tablet Completed Course  . spironolactone (ALDACTONE) 25 MG tablet Duplicate  . pramipexole (MIRAPEX) 0.125 MG tablet Error  . Omega-3 Fatty Acids (FISH OIL PO) Completed Course  . PRALUENT 75 MG/ML SOAJ Reorder    Recommendations:   Deborah Mcmillan  is a 85 y.o. Caucasian female with carotid atheresclerosis and hypertension and hyperlipidemia, LDL in the range of 170-190, however she has not been able to tolerate any statins. She was on a statin intolerant trial until December 2017 when she had developed severe rash throughout her body felt to be drug related rash and this was discontinued and now on Praluent. She has bronchial asthma and multiple medication allergies and intolerances.  Referred back to me by her PCP for gradually worsening dyspnea.  She presents here for annual visit and follow-up of hyperlipidemia,  worsening dyspnea which I suspect is related to her recent weight gain, deconditioning and also uncontrolled hypertension.  Do not suspect coronary artery disease or acute decompensated heart failure.  I have started her on amlodipine 5 mg in the evening, will follow her up in 6 weeks, weight loss discussed extensively.  With regard to hyperlipidemia, her recent labs reviewed, lipids in excellent control, continue Praluent.  She does have carotid atherosclerosis by ultrasound but no high-grade stenosis.  Carotid bruit appears less prominent than previous.                                                Adrian Prows, MD, [facility] 04/30/2019, 2:49 PM [facility] [place] Office: [phone]

## 2019-05-02 DIAGNOSIS — D2271 Melanocytic nevi of right lower limb, including hip: Secondary | ICD-10-CM | POA: Diagnosis not present

## 2019-05-02 DIAGNOSIS — L821 Other seborrheic keratosis: Secondary | ICD-10-CM | POA: Diagnosis not present

## 2019-05-02 DIAGNOSIS — D485 Neoplasm of uncertain behavior of skin: Secondary | ICD-10-CM | POA: Diagnosis not present

## 2019-05-02 DIAGNOSIS — L820 Inflamed seborrheic keratosis: Secondary | ICD-10-CM | POA: Diagnosis not present

## 2019-05-02 DIAGNOSIS — L578 Other skin changes due to chronic exposure to nonionizing radiation: Secondary | ICD-10-CM | POA: Diagnosis not present

## 2019-05-02 DIAGNOSIS — D225 Melanocytic nevi of trunk: Secondary | ICD-10-CM | POA: Diagnosis not present

## 2019-05-02 DIAGNOSIS — L570 Actinic keratosis: Secondary | ICD-10-CM | POA: Diagnosis not present

## 2019-05-02 DIAGNOSIS — Z86018 Personal history of other benign neoplasm: Secondary | ICD-10-CM | POA: Diagnosis not present

## 2019-05-02 DIAGNOSIS — Z808 Family history of malignant neoplasm of other organs or systems: Secondary | ICD-10-CM | POA: Diagnosis not present

## 2019-05-16 ENCOUNTER — Other Ambulatory Visit: Payer: Self-pay | Admitting: Cardiology

## 2019-05-31 DIAGNOSIS — J45901 Unspecified asthma with (acute) exacerbation: Secondary | ICD-10-CM | POA: Diagnosis not present

## 2019-06-04 ENCOUNTER — Ambulatory Visit: Payer: Medicare Other | Admitting: Neurology

## 2019-06-14 ENCOUNTER — Ambulatory Visit: Payer: BLUE CROSS/BLUE SHIELD | Admitting: Cardiology

## 2019-06-18 ENCOUNTER — Other Ambulatory Visit (HOSPITAL_COMMUNITY): Payer: Self-pay | Admitting: *Deleted

## 2019-06-19 ENCOUNTER — Encounter (HOSPITAL_COMMUNITY): Payer: Medicare Other

## 2019-06-24 ENCOUNTER — Other Ambulatory Visit: Payer: Self-pay | Admitting: Cardiology

## 2019-06-24 DIAGNOSIS — I10 Essential (primary) hypertension: Secondary | ICD-10-CM

## 2019-06-29 ENCOUNTER — Other Ambulatory Visit: Payer: Self-pay

## 2019-06-29 ENCOUNTER — Ambulatory Visit (HOSPITAL_COMMUNITY)
Admission: RE | Admit: 2019-06-29 | Discharge: 2019-06-29 | Disposition: A | Discharge status: Home / Self Care | Payer: Medicare Other | Source: Ambulatory Visit | Attending: Endocrinology | Admitting: Endocrinology

## 2019-06-29 DIAGNOSIS — M810 Age-related osteoporosis without current pathological fracture: Secondary | ICD-10-CM | POA: Insufficient documentation

## 2019-06-29 DIAGNOSIS — M25561 Pain in right knee: Secondary | ICD-10-CM | POA: Diagnosis not present

## 2019-06-29 MED ORDER — DENOSUMAB 60 MG/ML ~~LOC~~ SOSY: 60.0000 mg | PREFILLED_SYRINGE | Freq: Once | SUBCUTANEOUS | Status: AC

## 2019-06-29 MED ORDER — DENOSUMAB 60 MG/ML ~~LOC~~ SOSY
PREFILLED_SYRINGE | SUBCUTANEOUS | Status: AC
  Administered 2019-06-29: 60 mg via SUBCUTANEOUS
  Filled 2019-06-29: qty 1

## 2019-07-23 ENCOUNTER — Encounter: Payer: Self-pay | Admitting: Cardiology

## 2019-07-23 ENCOUNTER — Ambulatory Visit: Payer: Medicare Other | Admitting: Cardiology

## 2019-07-23 ENCOUNTER — Other Ambulatory Visit: Payer: Self-pay

## 2019-07-23 DIAGNOSIS — I10 Essential (primary) hypertension: Secondary | ICD-10-CM

## 2019-07-23 NOTE — Progress Notes (Addendum)
Primary Physician/Referring:  Deland Pretty, MD  Patient ID: Deborah Mcmillan, female    DOB: Mar 02, 1934, 85 y.o.   MRN: [id number]  Chief Complaint  Patient presents with  . Follow-up    6 week  . Hypertension   HPI:    Deborah Mcmillan  is a 85 y.o. Caucasian female with carotid atheresclerosis and hypertension and hyperlipidemia, LDL in the range of 170-190, however she has not been able to tolerate any statins. She was on a statin intolerant trial until December 2017 when she had developed severe rash throughout her body felt to be drug related rash and this was discontinued and now on Praluent. She has bronchial asthma and multiple medication allergies and intolerances.   She was seen by her PCP, Lilyan Gilford, NP and due to gradually worsening dyspnea, she was referred back to see me.  I received a 6 weeks ago and due to elevated blood pressure and added amlodipine, she is tolerating this but states that over the past 2 weeks she has started noticing severe leg cramps and is wondering whether it may related amlodipine.  Dyspnea has improved. No leg edema, no PND or orthopnea.  Past Medical History:  Diagnosis Date  . Dyslipidemia   . Dysrhythmia    h/o atrial couplets with short PAT, h/o a-fib during surgery (event monitor)  . GERD (gastroesophageal reflux disease)   . History of nuclear stress test 05/2009   dipyridamole; normal, low risk   . Hypertension   . Osteoporosis   . Postoperative atrial fibrillation ([place])  2011  . Seasonal allergies    Past Surgical History:  Procedure Laterality Date  . APPENDECTOMY  1961  . BLADDER SURGERY  2002  . COSMETIC SURGERY  2011   1 episode of atrial fibrillation during this   . HERNIA REPAIR  1999   hiatal  . lump removal  05/06/2009   [facility]  . [place] MYOVIEW LTD  May 2011   No ischemia or infarction.  Marland Kitchen SHOULDER SURGERY Left Feb 2015   Dr. Veverly Fells - arthroscopy (rotator cuff ?debridement plus biceps tenodesis)  . TRANSTHORACIC  ECHOCARDIOGRAM  05/2009   EF=>55%, with normal LV systolic function, impaired LV relaxation; trace MR/TR; mild AV regurg   Family History  Problem Relation Age of Onset  . Cancer Father   . Cancer Brother   . Breast cancer Neg Hx     Social History   Tobacco Use  . Smoking status: Never Smoker  . Smokeless tobacco: Never Used  Substance Use Topics  . Alcohol use: No   ROS  Review of Systems  Cardiovascular: Positive for dyspnea on exertion. Negative for chest pain and leg swelling.  Gastrointestinal: Negative for melena.   Objective  Blood pressure (!) 126/57, pulse 63, resp. rate 16, height 5' (1.524 m), weight 188 lb (85.3 kg), SpO2 96 %.  Vitals with BMI 07/23/2019 06/29/2019 04/30/2019  Height _0  - _1   Weight 188 lbs - 186 lbs  BMI 69.62 - 95.28  Systolic 413 244 010  Diastolic 57 48 74  Pulse 63 66 76     Physical Exam Cardiovascular:     Rate and Rhythm: Normal rate and regular rhythm.     Pulses: Intact distal pulses.          Carotid pulses are on the right side with bruit and on the left side with bruit.    Heart sounds: Normal heart sounds. No murmur heard.  No gallop.  Comments: No leg edema, no JVD. Pulmonary:     Effort: Pulmonary effort is normal.     Breath sounds: Normal breath sounds.  Abdominal:     General: Bowel sounds are normal.     Palpations: Abdomen is soft.    Laboratory examination:   No results for input(s): NA, K, CL, CO2, GLUCOSE, BUN, CREATININE, CALCIUM, GFRNONAA, GFRAA in the last 8760 hours. CrCl cannot be calculated (Patient's most recent lab result is older than the maximum 21 days allowed.).  CMP Latest Ref Rng & Units 12/25/2017  Glucose 70 - 99 mg/dL 141(H)  BUN 8 - 23 mg/dL 23  Creatinine 0.44 - 1.00 mg/dL 0.86  Sodium 135 - 145 mmol/L 138  Potassium 3.5 - 5.1 mmol/L 3.6  Chloride 98 - 111 mmol/L 101  CO2 22 - 32 mmol/L 20(L)  Calcium 8.9 - 10.3 mg/dL 9.4   CBC Latest Ref Rng & Units 12/25/2017  WBC 4.0 -  10.5 K/uL 8.5  Hemoglobin 12.0 - 15.0 g/dL 13.9  Hematocrit 36 - 46 % 44.3  Platelets 150 - 400 K/uL 232   External labs:   Lab 02/0/2021: Hb 14.6/HCT 43.1, platelets 235. Serum glucose 110 mg, sodium 142, potassium 4.0, BUN 24, creatinine 0.9, EGFR >60 mL.  CMP otherwise normal.  Total cholesterol 155, triglycerides 133, HDL 64, LDL 64.  Non-HDL cholesterol 91.   02/01/2017: CBC normal. Creatinine 0.8, EGFR 74/90, potassium 3.8, CMP normal.  Cholesterol 230, triglycerides 114, HDL 64, LDL 143. TSH normal.  Medications and allergies   Allergies  Allergen Reactions  . Calcium-Containing Compounds Other (See Comments)    Muscle cramping  . Magnesium-Containing Compounds Itching and Rash  . Tape Itching and Rash    Plastic, and "brown tape"  . Codeine     Pain & upset stomach  . Crestor [Rosuvastatin]     Statin intolerance  . Lodine [Etodolac]     Upset stomach  . Penicillins   . Reclast [Zoledronic Acid] Other (See Comments)    Caused severe jaw pain  . Sulfa Antibiotics     Pain & upset stomach  . Welchol [Colesevelam] Nausea Only and Other (See Comments)    Stomach did not feel good  . Zetia [Ezetimibe]   . Niacin And Related Rash    "on fire"  . Tetracyclines & Related Rash    Upset stomach     Current Outpatient Medications  Medication Instructions  . Alirocumab (PRALUENT) 75 MG/ML SOAJ 1 pen, Subcutaneous, Every 14 days  . amLODipine (NORVASC) 5 MG tablet TAKE 1 TABLET(5 MG) BY MOUTH DAILY AFTER SUPPER  . ASMANEX HFA 100 MCG/ACT AERO SMARTSIG:2 Puff(s) By Mouth Twice Daily  . Cholecalciferol (VITAMIN D) 2000 UNITS CAPS 1 capsule, Daily  . denosumab (PROLIA) 60 mg, Subcutaneous, Every 6 months  . Magnesium 400-800 mg, Daily  . metoprolol succinate (TOPROL-XL) 100 MG 24 hr tablet TAKE 1 TABLET(100 MG) BY MOUTH EVERY EVENING  . omeprazole (PRILOSEC) 20 mg, Daily  . OVER THE COUNTER MEDICATION Q10 (qunol ) 200 mg a daily  . spironolactone (ALDACTONE) 50 MG  tablet TAKE 1 TABLET BY MOUTH EVERY MORNING   Radiology:   No results found.  Cardiac Studies:   Carotid artery duplex 10/16/15: No hemodynamically significant arterial disease in the internal carotid artery bilaterally. Minimal mixed plaque noted. Antegrade vertebral artery flow.  Nuclear stress test  [12/11/2015]:  1. The resting electrocardiogram demonstrated normal sinus rhythm, normal resting conduction, no resting arrhythmias and normal rest  repolarization. Stress EKG is non-diagnostic for ischemia as it a pharmacologic stress using Lexiscan. Stress symptoms included dyspnea. 2. Myocardial perfusion imaging is normal. Overall left ventricular systolic function was normal without regional wall motion abnormalities. The left ventricular ejection fraction was 58%.  Echocardiogram  [12/09/2015]:  Left ventricle cavity is normal in size. Normal global wall motion. Normal diastolic filling pattern, normal LAP. Calculated EF 66%. Left atrial cavity is mildly dilated at 4.2 cm. Mild to moderate aortic regurgitation. Mild to at most moderate central mitral regurgitation. Mild tricuspid regurgitation. No evidence of pulmonary hypertension.  EKG  EKG 04/30/2019: Normal sinus rhythm with rate of 66 bpm, left atrial enlargement, normal axis.  No evidence of ischemia otherwise normal EKG.  No significant change from EKG 01/02/2018.  Assessment     ICD-10-CM   1. Essential hypertension  I10      No orders of the defined types were placed in this encounter.   There are no discontinued medications.  Recommendations:   Deborah Mcmillan  is a 85 y.o. Caucasian female with carotid atheresclerosis and hypertension and hyperlipidemia, LDL in the range of 170-190, however she has not been able to tolerate any statins. She was on a statin intolerant trial until December 2017 when she had developed severe rash throughout her body felt to be drug related rash and this was discontinued and now  on Praluent. She has bronchial asthma and multiple medication allergies and intolerances.    I had seen her 6 weeks ago, I had added amlodipine for hypertension and suspect her dyspnea is related to a combination of bronchial asthma, allergies and also uncontrolled hypertension.  Symptoms of dyspnea has improved, she is using nebulizers on a as needed basis.  However now states that she is developing severe leg cramps.  I also noted that she has discontinued many of her supplements including magnesium supplements and I am wondering whether her leg cramps may be related to electrolyte imbalance and amlodipine.    She will restart magnesium supplements and see whether her symptoms will go away and if she does feel better, I will send for 90-day prescriptions with 3 refills otherwise we may have to look at a new medication.  She is tolerating metoprolol without any side effects but I do not want to go up on the dosage in view of her advanced age.  I will see her back in 6 months or sooner if problems.                                                Adrian Prows, MD, [facility] 07/23/2019, 3:30 PM [facility] [place] Office: [phone]

## 2019-08-06 ENCOUNTER — Ambulatory Visit: Payer: Medicare Other | Admitting: Neurology

## 2019-08-14 DIAGNOSIS — J0100 Acute maxillary sinusitis, unspecified: Secondary | ICD-10-CM | POA: Diagnosis not present

## 2019-08-28 ENCOUNTER — Other Ambulatory Visit: Payer: Self-pay | Admitting: Cardiology

## 2019-09-04 DIAGNOSIS — M1712 Unilateral primary osteoarthritis, left knee: Secondary | ICD-10-CM | POA: Diagnosis not present

## 2019-09-04 DIAGNOSIS — M25561 Pain in right knee: Secondary | ICD-10-CM | POA: Diagnosis not present

## 2019-09-04 DIAGNOSIS — M1711 Unilateral primary osteoarthritis, right knee: Secondary | ICD-10-CM | POA: Diagnosis not present

## 2019-09-04 DIAGNOSIS — M170 Bilateral primary osteoarthritis of knee: Secondary | ICD-10-CM | POA: Diagnosis not present

## 2019-09-11 DIAGNOSIS — M1711 Unilateral primary osteoarthritis, right knee: Secondary | ICD-10-CM | POA: Diagnosis not present

## 2019-09-18 DIAGNOSIS — M25561 Pain in right knee: Secondary | ICD-10-CM | POA: Diagnosis not present

## 2019-09-24 ENCOUNTER — Other Ambulatory Visit: Payer: Self-pay | Admitting: Cardiology

## 2019-09-29 DIAGNOSIS — Z20828 Contact with and (suspected) exposure to other viral communicable diseases: Secondary | ICD-10-CM | POA: Diagnosis not present

## 2019-10-03 ENCOUNTER — Ambulatory Visit: Payer: Medicare Other | Admitting: Neurology

## 2019-12-08 ENCOUNTER — Other Ambulatory Visit: Payer: Self-pay | Admitting: Cardiology

## 2020-01-01 DIAGNOSIS — Z23 Encounter for immunization: Secondary | ICD-10-CM | POA: Diagnosis not present

## 2020-01-24 ENCOUNTER — Other Ambulatory Visit: Payer: Self-pay

## 2020-01-24 ENCOUNTER — Encounter: Payer: Self-pay | Admitting: Cardiology

## 2020-01-24 ENCOUNTER — Ambulatory Visit: Payer: Medicare Other | Admitting: Cardiology

## 2020-01-24 VITALS — BP 138/63 | Temp 97.9°F | Resp 16 | Ht 60.0 in | Wt 179.2 lb

## 2020-01-24 DIAGNOSIS — I10 Essential (primary) hypertension: Secondary | ICD-10-CM

## 2020-01-24 DIAGNOSIS — I6523 Occlusion and stenosis of bilateral carotid arteries: Secondary | ICD-10-CM

## 2020-01-24 DIAGNOSIS — E7800 Pure hypercholesterolemia, unspecified: Secondary | ICD-10-CM

## 2020-01-24 DIAGNOSIS — R0600 Dyspnea, unspecified: Secondary | ICD-10-CM

## 2020-01-24 DIAGNOSIS — R0609 Other forms of dyspnea: Secondary | ICD-10-CM

## 2020-01-24 NOTE — Progress Notes (Signed)
Primary Physician/Referring:  Deland Pretty, MD  Patient ID: Deborah Mcmillan, female    DOB: Aug 08, 1934, 86 y.o.   MRN: [id number]  Chief Complaint  Patient presents with  . Hypertension  . Follow-up    6 month   HPI:    Deborah Mcmillan  is a 86 y.o.  Caucasian female with carotid atheresclerosis and hypertension and hyperlipidemia, LDL in the range of 170-190, however she has not been able to tolerate any statins. She is now on Praluent. She has bronchial asthma and multiple medication allergies and intolerances.    Patient is here on a 90-monthoffice visit for carotid atherosclerosis, dyspnea on exertion and also hypertension.  Continues to have exertional dyspnea but no PND or orthopnea.  Except for episodes of sudden onset weakness in her legs and body that appears to improve if she eats no other specific symptoms.  Past Medical History:  Diagnosis Date  . Dyslipidemia   . Dysrhythmia    h/o atrial couplets with short PAT, h/o a-fib during surgery (event monitor)  . GERD (gastroesophageal reflux disease)   . History of nuclear stress test 05/2009   dipyridamole; normal, low risk   . Hypertension   . Osteoporosis   . [place]  . Seasonal allergies    Past Surgical History:  Procedure Laterality Date  . APPENDECTOMY  1961  . BLADDER SURGERY  2002  . COSMETIC SURGERY  2011   1 episode of atrial fibrillation during this   . HERNIA REPAIR  1999   [facility]  . [facility] [place] [facility]  May 2011   No ischemia or infarction.  .Marland KitchenSHOULDER SURGERY Left Feb 2015   Dr. NVeverly Fells- arthroscopy (rotator cuff ?debridement plus biceps tenodesis)  . TRANSTHORACIC ECHOCARDIOGRAM  05/2009   EF=>55%, with normal LV systolic function, impaired LV relaxation; trace MR/TR; mild AV regurg   Family History  Problem Relation Age of Onset  . Cancer Father   . Cancer - Other Father   . Cancer Brother   . Cancer - Other Brother   .  Stroke Mother   . Cancer - Other Sister   . Cancer - Other Sister   . Breast cancer Neg Hx     Social History   Tobacco Use  . Smoking status: Never Smoker  . Smokeless tobacco: Never Used  Substance Use Topics  . Alcohol use: No   ROS  Review of Systems  Cardiovascular: Positive for dyspnea on exertion. Negative for chest pain and leg swelling.  Gastrointestinal: Negative for melena.   Objective  Blood pressure 130/63, temperature 97.9 F (36.6 C), temperature source Temporal, resp. rate 16, height 5' (1.524 m), weight 179 lb 3.2 oz (81.3 kg).  Vitals with BMI 01/24/2020 07/23/2019 06/29/2019  Height 5' 0" 5' 0" -  Weight 179 lbs 3 oz 188 lbs -  BMI 35 332.99-  Systolic [id number] Diastolic 63 57 48  Pulse - 63 66     Physical Exam Cardiovascular:     Rate and Rhythm: Normal rate and regular rhythm.     Pulses: Intact distal pulses.          Carotid pulses are on the right side with bruit and on the left side with bruit.    Heart sounds: Normal heart sounds. No murmur heard. No gallop.      Comments: No leg edema, no JVD. Pulmonary:  Effort: Pulmonary effort is normal.     Breath sounds: Normal breath sounds.  Abdominal:     General: Bowel sounds are normal.     Palpations: Abdomen is soft.    Laboratory examination:   No results for input(s): NA, K, CL, CO2, GLUCOSE, BUN, CREATININE, CALCIUM, GFRNONAA, GFRAA in the last 8760 hours. CrCl cannot be calculated (Patient's most recent lab result is older than the maximum 21 days allowed.).  CMP Latest Ref Rng & Units 12/25/2017  Glucose 70 - 99 mg/dL 141(H)  BUN 8 - 23 mg/dL 23  Creatinine 0.44 - 1.00 mg/dL 0.86  Sodium 135 - 145 mmol/L 138  Potassium 3.5 - 5.1 mmol/L 3.6  Chloride 98 - 111 mmol/L 101  CO2 22 - 32 mmol/L 20(L)  Calcium 8.9 - 10.3 mg/dL 9.4   CBC Latest Ref Rng & Units 12/25/2017  WBC 4.0 - 10.5 K/uL 8.5  Hemoglobin 12.0 - 15.0 g/dL 13.9  Hematocrit 36.0 - 46.0 % 44.3  Platelets 150 - 400  K/uL 232    External labs:     Labs 02/12/2019:  Hb 14.6/HCT 43.1, platelets 225, normal indicis.  Sodium 142, potassium 4.0, BUN 24, creatinine 0.9, EGFR 63 mL. CMP normal.  Total cholesterol 155, triglycerides 133, HDL 64, LDL 64.  Medications and allergies   Allergies  Allergen Reactions  . Calcium-Containing Compounds Other (See Comments)    Muscle cramping  . Magnesium-Containing Compounds Itching and Rash  . Tape Itching and Rash    Plastic, and "brown tape"  . Codeine     Pain & upset stomach  . Crestor [Rosuvastatin]     Statin intolerance  . Lodine [Etodolac]     Upset stomach  . Penicillins   . Reclast [Zoledronic Acid] Other (See Comments)    Caused severe jaw pain  . Sulfa Antibiotics     Pain & upset stomach  . Welchol [Colesevelam] Nausea Only and Other (See Comments)    Stomach did not feel good  . Zetia [Ezetimibe]   . Niacin And Related Rash    "on fire"  . Tetracyclines & Related Rash    Upset stomach     Current Outpatient Medications on File Prior to Visit  Medication Sig Dispense Refill  . Alirocumab (PRALUENT) 75 MG/ML SOAJ Inject 1 pen into the skin every 14 (fourteen) days. 6 pen 4  . amLODipine (NORVASC) 5 MG tablet TAKE 1 TABLET(5 MG) BY MOUTH DAILY AFTER SUPPER 90 tablet 2  . ASMANEX HFA 100 MCG/ACT AERO SMARTSIG:2 Puff(s) By Mouth Twice Daily    . Cholecalciferol (VITAMIN D) 2000 UNITS CAPS Take 1 capsule by mouth daily.    Marland Kitchen denosumab (PROLIA) 60 MG/ML SOSY injection Inject 60 mg into the skin every 6 (six) months.    . Magnesium 400 MG CAPS Take 400-800 mg by mouth daily.    . metoprolol succinate (TOPROL-XL) 100 MG 24 hr tablet TAKE 1 TABLET(100 MG) BY MOUTH EVERY EVENING 90 tablet 0  . omeprazole (PRILOSEC) 20 MG capsule Take 20 mg by mouth daily.    Marland Kitchen spironolactone (ALDACTONE) 50 MG tablet TAKE 1 TABLET BY MOUTH EVERY MORNING 90 tablet 3   No current facility-administered medications on file prior to visit.    Radiology:    No results found.  Cardiac Studies:   Carotid artery duplex 2015-10-24: No hemodynamically significant arterial disease in the internal carotid artery bilaterally. Minimal mixed plaque noted. Antegrade vertebral artery flow.  Nuclear stress test  [12/11/2015]:  1. The resting electrocardiogram demonstrated normal sinus rhythm, normal resting conduction, no resting arrhythmias and normal rest repolarization. Stress EKG is non-diagnostic for ischemia as it a pharmacologic stress using Lexiscan. Stress symptoms included dyspnea. 2. Myocardial perfusion imaging is normal. Overall left ventricular systolic function was normal without regional wall motion abnormalities. The left ventricular ejection fraction was 58%.  Echocardiogram  [12/09/2015]:  Left ventricle cavity is normal in size. Normal global wall motion. Normal diastolic filling pattern, normal LAP. Calculated EF 66%. Left atrial cavity is mildly dilated at 4.2 cm. Mild to moderate aortic regurgitation. Mild to at most moderate central mitral regurgitation. Mild tricuspid regurgitation. No evidence of pulmonary hypertension.  EKG    EKG 01/24/2020: Normal sinus rhythm at rate of 65 bpm, normal axis. No evidence of ischemia, normal EKG. No significant change from 04/30/2019.   Assessment     ICD-10-CM   1. Essential hypertension  I10 EKG 12-Lead  2. Atherosclerosis of both carotid arteries  I65.23 PCV CAROTID DUPLEX (BILATERAL)  3. Hypercholesteremia  E78.00   4. Dyspnea on exertion  R06.00 PCV ECHOCARDIOGRAM COMPLETE     No orders of the defined types were placed in this encounter.   Medications Discontinued During This Encounter  Medication Reason  . OVER THE COUNTER MEDICATION Patient Preference   Orders Placed This Encounter  Procedures  . EKG 12-Lead  . PCV ECHOCARDIOGRAM COMPLETE    Standing Status:   Future    Standing Expiration Date:   01/23/2021      Recommendations:   Deborah Mcmillan  is a 86 y.o.  Caucasian female with carotid atheresclerosis and hypertension and hyperlipidemia, LDL in the range of 170-190, however she has not been able to tolerate any statins. She is now on Praluent. She has bronchial asthma and multiple medication allergies and intolerances.    Patient is here on a 2-monthoffice visit for carotid atherosclerosis, dyspnea on exertion and also hypertension.  Continues to have exertional dyspnea but no PND or orthopnea.  Except for episodes of sudden onset weakness in her legs and body that appears to improve if she eats no other specific symptoms.  Symptoms do not suggest sinus node dysfunction or cardiac arrhythmias.  Suspect could be related to metabolic issues including prediabetes.  With regard to hypertension, since addition of amlodipine, blood pressure has been well controlled.  Advised her to continue the same for now.  We will repeat carotid artery duplex as it has been greater than 4 years since last carotid duplex, she has a very prominent right carotid bruit but also has left carotid bruit.  In view of continued dyspnea on exertion, I will also repeat an echocardiogram.  Unless these are markedly abnormal, I will see her back in a year.  Suspect her dyspnea may be related to deconditioning and mild obesity.   JAdrian Prows MD, [facility]1/13/2022, 12:02 PM Office: [phone]: [phone]

## 2020-02-07 ENCOUNTER — Other Ambulatory Visit: Payer: BLUE CROSS/BLUE SHIELD

## 2020-02-18 DIAGNOSIS — E7800 Pure hypercholesterolemia, unspecified: Secondary | ICD-10-CM | POA: Diagnosis not present

## 2020-02-18 DIAGNOSIS — Z0000 Encounter for general adult medical examination without abnormal findings: Secondary | ICD-10-CM | POA: Diagnosis not present

## 2020-02-18 DIAGNOSIS — M810 Age-related osteoporosis without current pathological fracture: Secondary | ICD-10-CM | POA: Diagnosis not present

## 2020-02-18 DIAGNOSIS — E559 Vitamin D deficiency, unspecified: Secondary | ICD-10-CM | POA: Diagnosis not present

## 2020-02-18 DIAGNOSIS — I10 Essential (primary) hypertension: Secondary | ICD-10-CM | POA: Diagnosis not present

## 2020-02-18 DIAGNOSIS — E789 Disorder of lipoprotein metabolism, unspecified: Secondary | ICD-10-CM | POA: Diagnosis not present

## 2020-03-06 ENCOUNTER — Other Ambulatory Visit: Payer: Self-pay | Admitting: Cardiology

## 2020-03-17 ENCOUNTER — Other Ambulatory Visit: Payer: Self-pay | Admitting: Internal Medicine

## 2020-03-17 DIAGNOSIS — Z1231 Encounter for screening mammogram for malignant neoplasm of breast: Secondary | ICD-10-CM

## 2020-03-18 ENCOUNTER — Other Ambulatory Visit: Payer: BLUE CROSS/BLUE SHIELD

## 2020-03-31 DIAGNOSIS — J45909 Unspecified asthma, uncomplicated: Secondary | ICD-10-CM | POA: Diagnosis not present

## 2020-03-31 DIAGNOSIS — Z800 Family history of malignant neoplasm of digestive organs: Secondary | ICD-10-CM | POA: Diagnosis not present

## 2020-03-31 DIAGNOSIS — Z0000 Encounter for general adult medical examination without abnormal findings: Secondary | ICD-10-CM | POA: Diagnosis not present

## 2020-03-31 DIAGNOSIS — R0602 Shortness of breath: Secondary | ICD-10-CM | POA: Diagnosis not present

## 2020-03-31 DIAGNOSIS — I10 Essential (primary) hypertension: Secondary | ICD-10-CM | POA: Diagnosis not present

## 2020-03-31 DIAGNOSIS — E7800 Pure hypercholesterolemia, unspecified: Secondary | ICD-10-CM | POA: Diagnosis not present

## 2020-03-31 DIAGNOSIS — U099 Post covid-19 condition, unspecified: Secondary | ICD-10-CM | POA: Diagnosis not present

## 2020-03-31 DIAGNOSIS — M810 Age-related osteoporosis without current pathological fracture: Secondary | ICD-10-CM | POA: Diagnosis not present

## 2020-04-03 ENCOUNTER — Ambulatory Visit: Payer: Medicare Other

## 2020-04-03 ENCOUNTER — Other Ambulatory Visit: Payer: Self-pay

## 2020-04-03 DIAGNOSIS — R0609 Other forms of dyspnea: Secondary | ICD-10-CM | POA: Diagnosis not present

## 2020-04-03 DIAGNOSIS — R0600 Dyspnea, unspecified: Secondary | ICD-10-CM

## 2020-04-03 DIAGNOSIS — I6523 Occlusion and stenosis of bilateral carotid arteries: Secondary | ICD-10-CM | POA: Diagnosis not present

## 2020-04-06 NOTE — Progress Notes (Signed)
Minimal stenosis (plaque) in bilateral carotid and unchanged form 2017

## 2020-04-11 ENCOUNTER — Other Ambulatory Visit (HOSPITAL_COMMUNITY): Payer: Self-pay | Admitting: *Deleted

## 2020-04-14 ENCOUNTER — Encounter (HOSPITAL_COMMUNITY): Payer: Medicare Other

## 2020-04-14 DIAGNOSIS — N390 Urinary tract infection, site not specified: Secondary | ICD-10-CM | POA: Diagnosis not present

## 2020-04-20 ENCOUNTER — Other Ambulatory Visit: Payer: Self-pay | Admitting: Cardiology

## 2020-04-20 DIAGNOSIS — I10 Essential (primary) hypertension: Secondary | ICD-10-CM

## 2020-05-01 DIAGNOSIS — D485 Neoplasm of uncertain behavior of skin: Secondary | ICD-10-CM | POA: Diagnosis not present

## 2020-05-01 DIAGNOSIS — B079 Viral wart, unspecified: Secondary | ICD-10-CM | POA: Diagnosis not present

## 2020-05-01 DIAGNOSIS — L821 Other seborrheic keratosis: Secondary | ICD-10-CM | POA: Diagnosis not present

## 2020-05-01 DIAGNOSIS — Z808 Family history of malignant neoplasm of other organs or systems: Secondary | ICD-10-CM | POA: Diagnosis not present

## 2020-05-01 DIAGNOSIS — D225 Melanocytic nevi of trunk: Secondary | ICD-10-CM | POA: Diagnosis not present

## 2020-05-01 DIAGNOSIS — D2271 Melanocytic nevi of right lower limb, including hip: Secondary | ICD-10-CM | POA: Diagnosis not present

## 2020-05-01 DIAGNOSIS — L570 Actinic keratosis: Secondary | ICD-10-CM | POA: Diagnosis not present

## 2020-05-01 DIAGNOSIS — L578 Other skin changes due to chronic exposure to nonionizing radiation: Secondary | ICD-10-CM | POA: Diagnosis not present

## 2020-05-01 DIAGNOSIS — Z86018 Personal history of other benign neoplasm: Secondary | ICD-10-CM | POA: Diagnosis not present

## 2020-05-05 ENCOUNTER — Other Ambulatory Visit: Payer: Self-pay | Admitting: Gastroenterology

## 2020-05-05 DIAGNOSIS — R1011 Right upper quadrant pain: Secondary | ICD-10-CM

## 2020-05-05 DIAGNOSIS — K5900 Constipation, unspecified: Secondary | ICD-10-CM | POA: Diagnosis not present

## 2020-05-05 DIAGNOSIS — Z800 Family history of malignant neoplasm of digestive organs: Secondary | ICD-10-CM | POA: Diagnosis not present

## 2020-05-05 DIAGNOSIS — Z86010 Personal history of colonic polyps: Secondary | ICD-10-CM | POA: Diagnosis not present

## 2020-05-08 ENCOUNTER — Ambulatory Visit
Admission: RE | Admit: 2020-05-08 | Discharge: 2020-05-08 | Disposition: A | Discharge status: Home / Self Care | Payer: Medicare Other | Source: Ambulatory Visit | Attending: Internal Medicine | Admitting: Internal Medicine

## 2020-05-08 ENCOUNTER — Other Ambulatory Visit: Payer: Self-pay

## 2020-05-08 DIAGNOSIS — Z1231 Encounter for screening mammogram for malignant neoplasm of breast: Secondary | ICD-10-CM

## 2020-05-09 ENCOUNTER — Encounter: Payer: Self-pay | Admitting: Pulmonary Disease

## 2020-05-09 ENCOUNTER — Ambulatory Visit (INDEPENDENT_AMBULATORY_CARE_PROVIDER_SITE_OTHER): Payer: Medicare Other

## 2020-05-09 ENCOUNTER — Ambulatory Visit (INDEPENDENT_AMBULATORY_CARE_PROVIDER_SITE_OTHER): Payer: Medicare Other | Admitting: Pulmonary Disease

## 2020-05-09 VITALS — BP 120/62 | HR 75 | Temp 98.0°F | Ht 60.0 in | Wt 174.0 lb

## 2020-05-09 DIAGNOSIS — J4540 Moderate persistent asthma, uncomplicated: Secondary | ICD-10-CM

## 2020-05-09 DIAGNOSIS — R0600 Dyspnea, unspecified: Secondary | ICD-10-CM

## 2020-05-09 DIAGNOSIS — R0602 Shortness of breath: Secondary | ICD-10-CM | POA: Diagnosis not present

## 2020-05-09 DIAGNOSIS — R0609 Other forms of dyspnea: Secondary | ICD-10-CM

## 2020-05-09 MED ORDER — BREO ELLIPTA 200-25 MCG/INH IN AEPB: 1.0000 | INHALATION_SPRAY | Freq: Every day | RESPIRATORY_TRACT | 11 refills | Status: DC

## 2020-05-09 NOTE — Patient Instructions (Signed)
Deborah Mcmillan  I think some of your symptoms are related to uncontrolled asthma worsened after your COVID infections.  It is also possible that your breathing muscles are a bit out of shape with less activity throughout the pandemic.  Stop using the Asmanex this is replaced with Breo  Use Breo 1 puff daily, rinse your mouth out after every use.  Continue to use the albuterol inhaler as needed as a rescue inhaler.  If the cost is too much or the co-pay is too high, please call us and we will try to search for an alternative.  The pharmacist may have an indication of what would be more affordable.  To further investigate causes of your symptoms, I have ordered a chest x-ray to be performed today.  In addition, I ordered pulmonary function or breathing tests to be performed in the coming days.  Return to clinic in 3 months or sooner as needed for follow-up with Deborah Mcmillan.

## 2020-05-12 NOTE — Progress Notes (Signed)
@Patient  ID: Deborah Mcmillan, female    DOB: 1934/12/26, 86 y.o.   MRN: [id number]  Chief Complaint  Patient presents with  . Consult    Covid in Nov 2020,Jan 2022. Has had Sob with exertion since.     Referring provider: Deland Pretty, MD  HPI:   86 year old woman whom we are seeing in consultation for evaluation of dyspnea on exertion.  Most recent PCP note reviewed.  Most recent cardiology note reviewed.  Patient has chronic dyspnea on exertion over the last couple years or so.  Maybe slight worsening over the last few months.  She thinks she had COVID prior to reliable testing in late 2019.  She again had in the fall 2020.  Since then her breathing has been worse, dyspnea on exertion.  Again had COVID early 2022.  Breathing worse with stairs or inclines.  Can be present over longer distance flat surface.  Relieved by rest.  Using Asmanex for her asthma.  This is not made much change in her dyspnea.  No timing during the day where things are better or worse.  No environmental factors to account for the change.  She does note seasonal allergies and may be breathing a bit worse when pollen in the spring.  Has been more congested over the last few weeks with pollen season.  No other alleviating or exacerbating factors.  Dyspnea described as moderate.  She does note she is much less active over the last year or 2 compared to prepandemic.  More times indoors.  Also notes mild 10 to 15 pound weight gain.  Chest x-ray most recent chest imaging in 2019 reviewed interpreted as clear lungs, no effusion, infiltrate, pneumothorax.  PMH: Asthma, hypertension, GERD Surgical history: Appendectomy, shoulder surgery, bladder surgery, Family history: Mother brother and sister with various forms of cancer, no pulmonary disease in first-degree relatives Social history: Never smoker, retired, lives in [place] / Pulmonary Flowsheets:   ACT:  No flowsheet data found.  MMRC: No  flowsheet data found.  Epworth:  No flowsheet data found.  Tests:   FENO:  No results found for: NITRICOXIDE  PFT: No flowsheet data found.  WALK:  No flowsheet data found.  Imaging: Personally reviewed and as per EMR discussion this note MM 3D SCREEN BREAST BILATERAL  Result Date: 05/08/2020 CLINICAL DATA:  Screening. EXAM: DIGITAL SCREENING BILATERAL MAMMOGRAM WITH TOMOSYNTHESIS AND CAD TECHNIQUE: Bilateral screening digital craniocaudal and mediolateral oblique mammograms were obtained. Bilateral screening digital breast tomosynthesis was performed. The images were evaluated with computer-aided detection. COMPARISON:  Previous exam(s). ACR Breast Density Category c: The breast tissue is heterogeneously dense, which may obscure small masses. FINDINGS: There are no findings suspicious for malignancy. The images were evaluated with computer-aided detection. IMPRESSION: No mammographic evidence of malignancy. A result letter of this screening mammogram will be mailed directly to the patient. RECOMMENDATION: Screening mammogram in one year. (Code:SM-B-01Y) BI-RADS CATEGORY  1: Negative. Electronically Signed   By: Abelardo Diesel M.D.   On: 05/08/2020 12:43    Lab Results: Personally reviewed, notably no IgE or eos readily available for review, no anemia CBC    Component Value Date/Time   WBC 8.5 12/25/2017 0600   RBC 4.73 12/25/2017 0600   HGB 13.9 12/25/2017 0600   HCT 44.3 12/25/2017 0600   PLT 232 12/25/2017 0600   MCV 93.7 12/25/2017 0600   MCH 29.4 12/25/2017 0600   MCHC 31.4 12/25/2017 0600   RDW 12.3 12/25/2017 0600  BMET    Component Value Date/Time   NA 138 12/25/2017 0600   K 3.6 12/25/2017 0600   CL 101 12/25/2017 0600   CO2 20 (L) 12/25/2017 0600   GLUCOSE 141 (H) 12/25/2017 0600   BUN 23 12/25/2017 0600   CREATININE 0.86 12/25/2017 0600   CALCIUM 9.4 12/25/2017 0600   GFRNONAA >60 12/25/2017 0600   GFRAA >60 12/25/2017 0600    BNP No results found  for: BNP  ProBNP No results found for: PROBNP  Specialty Problems   None     Allergies  Allergen Reactions  . Calcium-Containing Compounds Other (See Comments)    Muscle cramping  . Magnesium-Containing Compounds Itching and Rash  . Tape Itching and Rash    Plastic, and "brown tape"  . Codeine     Pain & upset stomach  . Crestor [Rosuvastatin]     Statin intolerance  . Lodine [Etodolac]     Upset stomach  . Penicillins   . Reclast [Zoledronic Acid] Other (See Comments)    Caused severe jaw pain  . Sulfa Antibiotics     Pain & upset stomach  . Welchol [Colesevelam] Nausea Only and Other (See Comments)    Stomach did not feel good  . Zetia [Ezetimibe]   . Niacin And Related Rash    "on fire"  . Tetracyclines & Related Rash    Upset stomach    Immunization History  Administered Date(s) Administered  . Influenza Split 10/14/2012  . Influenza-Unspecified 10/11/2017  . PFIZER(Purple Top)SARS-COV-2 Vaccination 03/29/2019, 04/23/2019, 01/01/2020    Past Medical History:  Diagnosis Date  . Dyslipidemia   . Dysrhythmia    h/o atrial couplets with short PAT, h/o a-fib during surgery (event monitor)  . GERD (gastroesophageal reflux disease)   . History of nuclear stress test 05/2009   dipyridamole; normal, low risk   . Hypertension   . Osteoporosis   . Postoperative atrial fibrillation ([place])  2011  . Seasonal allergies     Tobacco History: Social History   Tobacco Use  Smoking Status Never Smoker  Smokeless Tobacco Never Used   Counseling given: Not Answered   Continue to not smoke  Outpatient Encounter Medications as of 05/09/2020  Medication Sig  . Alirocumab (PRALUENT) 75 MG/ML SOAJ Inject 1 pen into the skin every 14 (fourteen) days.  Marland Kitchen amLODipine (NORVASC) 5 MG tablet TAKE 1 TABLET(5 MG) BY MOUTH DAILY AFTER SUPPER  . Cholecalciferol (VITAMIN D) 2000 UNITS CAPS Take 1 capsule by mouth daily.  Marland Kitchen denosumab (PROLIA) 60 MG/ML SOSY injection Inject 60 mg  into the skin every 6 (six) months.  . fluticasone furoate-vilanterol (BREO ELLIPTA) 200-25 MCG/INH AEPB Inhale 1 puff into the lungs daily.  . Magnesium 400 MG CAPS Take 400-800 mg by mouth daily.  . metoprolol succinate (TOPROL-XL) 100 MG 24 hr tablet TAKE 1 TABLET(100 MG) BY MOUTH EVERY EVENING  . omeprazole (PRILOSEC) 20 MG capsule Take 20 mg by mouth daily.  Marland Kitchen spironolactone (ALDACTONE) 50 MG tablet TAKE 1 TABLET BY MOUTH EVERY MORNING  . [DISCONTINUED] ASMANEX HFA 100 MCG/ACT AERO SMARTSIG:2 Puff(s) By Mouth Twice Daily   No facility-administered encounter medications on file as of 05/09/2020.     Review of Systems  Review of Systems  No chest pain with exertion.  No orthopnea or PND.  No lower extremity swelling.  Comprehensive review of systems otherwise negative. Physical Exam  BP 120/62 (BP Location: Left Arm, Cuff Size: Normal)   Pulse 75   Temp 98  F (36.7 C)   Ht 5' (1.524 m)   Wt 174 lb (78.9 kg)   SpO2 100%   BMI 33.98 kg/m   Wt Readings from Last 5 Encounters:  05/09/20 174 lb (78.9 kg)  01/24/20 179 lb 3.2 oz (81.3 kg)  07/23/19 188 lb (85.3 kg)  04/30/19 186 lb (84.4 kg)  07/06/17 169 lb (76.7 kg)    BMI Readings from Last 5 Encounters:  05/09/20 33.98 kg/m  01/24/20 35.00 kg/m  07/23/19 36.72 kg/m  04/30/19 36.33 kg/m  07/06/17 33.01 kg/m     Physical Exam General: Well-appearing, no acute distress Eyes: EOMI, icterus Neck: Supple no JVP Cardiovascular: Regular rate and rhythm, no murmurs Pulmonary: Clear to auscultation bilaterally, normal work of breathing on room air Abdomen: Nondistended, bowel sounds present MSK: No synovitis, joint effusion Neuro: Normal gait, no weakness Psych: Normal mood, full affect   Assessment & Plan:   DOE: Likely multifactorial in the setting of known prior asthma, mild weight gain, deconditioning with decreased activity.  Chest x-ray today to further evaluate.  PFTs ordered to be performed in the coming  days for further evaluation.  Asthma:  Well-controlled over time.  However, fear that recent COVID-19 infection triggered worsening asthma symptoms.  We will stop Asmanex and replace with Breo for ICS/LABA therapy.  Assess response to therapy.   Return in about 3 months (around 08/08/2020).   Lanier Clam, MD 05/12/2020

## 2020-05-12 NOTE — Progress Notes (Signed)
CXR clear, normal.

## 2020-05-13 ENCOUNTER — Telehealth: Payer: Self-pay | Admitting: Pulmonary Disease

## 2020-05-13 NOTE — Telephone Encounter (Signed)
Called and spoke with patient, she is allergic to Magnesium Sterate and this ingredient is in the [facility] that she was prescribed.  She would like to have something that does not contain this ingredient in it prescribed.  If possible she would like to get a 3 month supply at a time as it is cheaper.  Dr. Silas Flood, please advise.  Thank you.

## 2020-05-16 MED ORDER — ADVAIR HFA 230-21 MCG/ACT IN AERO: 2.0000 | INHALATION_SPRAY | Freq: Two times a day (BID) | RESPIRATORY_TRACT | 12 refills | Status: DC

## 2020-05-16 NOTE — Telephone Encounter (Signed)
Spoke with the pt and notified of response per [place]  She verbalized understanding  Nothing further needed

## 2020-05-16 NOTE — Telephone Encounter (Signed)
Advair HFA high dose 2 puff BID, Breo discontinued.

## 2020-05-20 ENCOUNTER — Other Ambulatory Visit: Payer: Medicare Other

## 2020-06-05 ENCOUNTER — Ambulatory Visit
Admission: RE | Admit: 2020-06-05 | Discharge: 2020-06-05 | Disposition: A | Discharge status: Home / Self Care | Payer: Medicare Other | Source: Ambulatory Visit | Attending: Gastroenterology | Admitting: Gastroenterology

## 2020-06-05 DIAGNOSIS — R1011 Right upper quadrant pain: Secondary | ICD-10-CM

## 2020-06-12 ENCOUNTER — Other Ambulatory Visit: Payer: Self-pay | Admitting: Cardiology

## 2020-06-24 DIAGNOSIS — M8588 Other specified disorders of bone density and structure, other site: Secondary | ICD-10-CM | POA: Diagnosis not present

## 2020-06-24 DIAGNOSIS — Z124 Encounter for screening for malignant neoplasm of cervix: Secondary | ICD-10-CM | POA: Diagnosis not present

## 2020-06-24 DIAGNOSIS — N952 Postmenopausal atrophic vaginitis: Secondary | ICD-10-CM | POA: Diagnosis not present

## 2020-06-24 DIAGNOSIS — Z01419 Encounter for gynecological examination (general) (routine) without abnormal findings: Secondary | ICD-10-CM | POA: Diagnosis not present

## 2020-06-24 DIAGNOSIS — Z6833 Body mass index (BMI) 33.0-33.9, adult: Secondary | ICD-10-CM | POA: Diagnosis not present

## 2020-06-24 DIAGNOSIS — N958 Other specified menopausal and perimenopausal disorders: Secondary | ICD-10-CM | POA: Diagnosis not present

## 2020-06-25 ENCOUNTER — Ambulatory Visit (HOSPITAL_COMMUNITY)
Admission: RE | Admit: 2020-06-25 | Discharge: 2020-06-25 | Disposition: A | Discharge status: Home / Self Care | Payer: Medicare Other | Source: Ambulatory Visit | Attending: Endocrinology | Admitting: Endocrinology

## 2020-06-25 ENCOUNTER — Other Ambulatory Visit: Payer: Self-pay

## 2020-06-25 DIAGNOSIS — M810 Age-related osteoporosis without current pathological fracture: Secondary | ICD-10-CM | POA: Diagnosis not present

## 2020-06-25 MED ORDER — DENOSUMAB 60 MG/ML ~~LOC~~ SOSY
60.0000 mg | PREFILLED_SYRINGE | Freq: Once | SUBCUTANEOUS | Status: AC
  Administered 2020-06-25: 60 mg via SUBCUTANEOUS

## 2020-06-25 MED ORDER — DENOSUMAB 60 MG/ML ~~LOC~~ SOSY
PREFILLED_SYRINGE | SUBCUTANEOUS | Status: AC
  Filled 2020-06-25: qty 1

## 2020-07-11 ENCOUNTER — Other Ambulatory Visit: Payer: Self-pay | Admitting: Cardiology

## 2020-07-29 ENCOUNTER — Telehealth: Payer: Self-pay | Admitting: Cardiology

## 2020-07-29 NOTE — Telephone Encounter (Signed)
Call was transferred to [facility] for Praline questions

## 2020-07-29 NOTE — Telephone Encounter (Signed)
Completed temperature excursion request on behalf of the pt. Provided necessary information to [employer]. Per staff, [employer] will reach out to the pt and pharmacy to coordinate approval, if application. Estimate process time ~5 business day. Pt aware and will notify the office if there is any discrepancies.

## 2020-08-15 ENCOUNTER — Encounter (HOSPITAL_BASED_OUTPATIENT_CLINIC_OR_DEPARTMENT_OTHER): Payer: Self-pay

## 2020-08-15 ENCOUNTER — Other Ambulatory Visit: Payer: Self-pay

## 2020-08-15 ENCOUNTER — Telehealth: Payer: Self-pay | Admitting: Cardiology

## 2020-08-15 ENCOUNTER — Emergency Department (HOSPITAL_BASED_OUTPATIENT_CLINIC_OR_DEPARTMENT_OTHER)
Admission: EM | Admit: 2020-08-15 | Discharge: 2020-08-15 | Disposition: A | Discharge status: Home / Self Care | Payer: Medicare Other | Attending: Emergency Medicine | Admitting: Emergency Medicine

## 2020-08-15 ENCOUNTER — Emergency Department (HOSPITAL_BASED_OUTPATIENT_CLINIC_OR_DEPARTMENT_OTHER): Payer: Medicare Other

## 2020-08-15 DIAGNOSIS — Z79899 Other long term (current) drug therapy: Secondary | ICD-10-CM | POA: Diagnosis not present

## 2020-08-15 DIAGNOSIS — I4891 Unspecified atrial fibrillation: Secondary | ICD-10-CM | POA: Insufficient documentation

## 2020-08-15 DIAGNOSIS — I10 Essential (primary) hypertension: Secondary | ICD-10-CM | POA: Diagnosis not present

## 2020-08-15 DIAGNOSIS — J9811 Atelectasis: Secondary | ICD-10-CM | POA: Diagnosis not present

## 2020-08-15 DIAGNOSIS — I480 Paroxysmal atrial fibrillation: Secondary | ICD-10-CM | POA: Diagnosis not present

## 2020-08-15 DIAGNOSIS — R0602 Shortness of breath: Secondary | ICD-10-CM | POA: Diagnosis not present

## 2020-08-15 DIAGNOSIS — R5383 Other fatigue: Secondary | ICD-10-CM | POA: Diagnosis not present

## 2020-08-15 DIAGNOSIS — I517 Cardiomegaly: Secondary | ICD-10-CM | POA: Diagnosis not present

## 2020-08-15 LAB — BASIC METABOLIC PANEL
Anion gap: 11 (ref 5–15)
BUN: 19 mg/dL (ref 8–23)
CO2: 25 mmol/L (ref 22–32)
Calcium: 9.7 mg/dL (ref 8.9–10.3)
Chloride: 102 mmol/L (ref 98–111)
Creatinine, Ser: 0.91 mg/dL (ref 0.44–1.00)
GFR, Estimated: >60 mL/min (ref >60)
Glucose, Bld: 110 mg/dL — ABNORMAL HIGH (ref 70–99)
Potassium: 4.5 mmol/L (ref 3.5–5.1)
Sodium: 138 mmol/L (ref 135–145)

## 2020-08-15 LAB — CBC WITH DIFFERENTIAL/PLATELET
Abs Immature Granulocytes: 0.03 10*3/uL (ref 0.00–0.07)
Basophils Absolute: 0.1 10*3/uL (ref 0.0–0.1)
Basophils Relative: 1 %
Eosinophils Absolute: 0.2 10*3/uL (ref 0.0–0.5)
Eosinophils Relative: 2 %
HCT: 46.7 % — ABNORMAL HIGH (ref 36.0–46.0)
Hemoglobin: 15.7 g/dL — ABNORMAL HIGH (ref 12.0–15.0)
Immature Granulocytes: 0 %
Lymphocytes Relative: 30 %
Lymphs Abs: 3.1 10*3/uL (ref 0.7–4.0)
MCH: 30.7 pg (ref 26.0–34.0)
MCHC: 33.6 g/dL (ref 30.0–36.0)
MCV: 91.4 fL (ref 80.0–100.0)
Monocytes Absolute: 0.8 10*3/uL (ref 0.1–1.0)
Monocytes Relative: 8 %
Neutro Abs: 6.1 10*3/uL (ref 1.7–7.7)
Neutrophils Relative %: 59 %
Platelets: 241 10*3/uL (ref 150–400)
RBC: 5.11 MIL/uL (ref 3.87–5.11)
RDW: 12.4 % (ref 11.5–15.5)
WBC: 10.3 10*3/uL (ref 4.0–10.5)
nRBC: 0 % (ref 0.0–0.2)

## 2020-08-15 LAB — MAGNESIUM: Magnesium: 2.2 mg/dL (ref 1.7–2.4)

## 2020-08-15 LAB — TROPONIN I (HIGH SENSITIVITY): Troponin I (High Sensitivity): 4 ng/L (ref <18)

## 2020-08-15 MED ORDER — DILTIAZEM HCL ER COATED BEADS 120 MG PO CP24
120.0000 mg | ORAL_CAPSULE | Freq: Every day | ORAL | 0 refills | Status: DC
Start: 2020-08-15 — End: 2020-08-18

## 2020-08-15 MED ORDER — SODIUM CHLORIDE 0.9 % IV BOLUS
500.0000 mL | Freq: Once | INTRAVENOUS | Status: AC
  Administered 2020-08-15: 500 mL via INTRAVENOUS

## 2020-08-15 MED ORDER — RIVAROXABAN 20 MG PO TABS: 20.0000 mg | ORAL_TABLET | Freq: Every day | ORAL | 0 refills | Status: DC

## 2020-08-15 MED ORDER — DILTIAZEM HCL 25 MG/5ML IV SOLN
10.0000 mg | Freq: Once | INTRAVENOUS | Status: AC
  Administered 2020-08-15: 10 mg via INTRAVENOUS
  Filled 2020-08-15: qty 5

## 2020-08-15 NOTE — ED Provider Notes (Signed)
[place] EMERGENCY DEPT Provider Note   CSN: [id number] Arrival date & time: 08/15/20  1001     History Chief Complaint  Patient presents with   Shortness of Breath    Deborah Mcmillan is a 86 y.o. female.  HPI  86 year old female with past medical history of paroxysmal atrial fibrillation, HTN, HLD presents to the emergency department with concern for shortness of breath and palpitations.  Patient states that she got up and went to get in the shower.  She felt like she "was not getting enough air" so she used her inhaler.  While in the shower she felt like she got clammy and had palpitations.  No syncope.  She woke up her son who said that she looked "ill" and he brought her here.  She denies any chest pain, back pain.  No recent cough/fever.  No swelling of her lower extremities.  She is not on anticoagulation.  She follows with Dr. Einar Gip for cardiology.  Past Medical History:  Diagnosis Date   Dyslipidemia    Dysrhythmia    h/o atrial couplets with short PAT, h/o a-fib during surgery (event monitor)   GERD (gastroesophageal reflux disease)    History of nuclear stress test 05/2009   dipyridamole; normal, low risk    Hypertension    Osteoporosis    Postoperative atrial fibrillation ([place])  2011   Seasonal allergies     Patient Active Problem List   Diagnosis Date Noted   Preoperative cardiovascular examination 11/14/2012   Postoperative atrial fibrillation ([place])    Essential hypertension    Dyslipidemia    Paroxysmal a-fib - only 1 episode documented, post-op 11/14/2009    Past Surgical History:  Procedure Laterality Date   APPENDECTOMY  1961   BLADDER SURGERY  2002   BREAST BIOPSY Left 05/09/2012   calcified and hyalinized fibroadenomatous and sclerosing adenosis   COSMETIC SURGERY  2011   1 episode of atrial fibrillation during this    [place]   hiatal   lump removal  05/06/2009   [facility]   NM MYOVIEW LTD  May 2011   No ischemia or  infarction.   SHOULDER SURGERY Left Feb 2015   Dr. Veverly Fells - arthroscopy (rotator cuff ?debridement plus biceps tenodesis)   TRANSTHORACIC ECHOCARDIOGRAM  05/2009   EF=>55%, with normal LV systolic function, impaired LV relaxation; trace MR/TR; mild AV regurg     OB History   No obstetric history on file.     Family History  Problem Relation Age of Onset   Cancer Father    Cancer - Other Father    Cancer Brother    Cancer - Other Brother    Stroke Mother    Cancer - Other Sister    Cancer - Other Sister    Breast cancer Neg Hx     Social History   Tobacco Use   Smoking status: Never   Smokeless tobacco: Never  Vaping Use   Vaping Use: Never used  Substance Use Topics   Alcohol use: No   Drug use: No    Home Medications Prior to Admission medications   Medication Sig Start Date End Date Taking? Authorizing Provider  fluticasone-salmeterol (ADVAIR HFA) 230-21 MCG/ACT inhaler Inhale 2 puffs into the lungs 2 (two) times daily. 05/16/20  Yes Hunsucker, Bonna Gains, MD  Magnesium 400 MG CAPS Take 400-800 mg by mouth daily.   Yes [provider]  metoprolol succinate (TOPROL-XL) 100 MG 24 hr tablet TAKE 1  TABLET(100 MG) BY MOUTH EVERY EVENING 06/12/20  Yes Patwardhan, Manish J, MD  omeprazole (PRILOSEC) 20 MG capsule Take 20 mg by mouth daily.   Yes [provider]  PRALUENT 75 MG/ML SOAJ INJECT 75 MG UNDER THE SKIN EVERY 14 DAYS 07/11/20  Yes Adrian Prows, MD  spironolactone (ALDACTONE) 50 MG tablet TAKE 1 TABLET BY MOUTH EVERY MORNING 09/24/19  Yes Adrian Prows, MD  amLODipine (NORVASC) 5 MG tablet TAKE 1 TABLET(5 MG) BY MOUTH DAILY AFTER SUPPER 04/21/20   Adrian Prows, MD  Cholecalciferol (VITAMIN D) 2000 UNITS CAPS Take 1 capsule by mouth daily.    [provider]  denosumab (PROLIA) 60 MG/ML SOSY injection Inject 60 mg into the skin every 6 (six) months.    [provider]    Allergies    Calcium-containing compounds, Magnesium-containing compounds,  Tape, Codeine, Crestor [rosuvastatin], Lodine [etodolac], Penicillins, Reclast [zoledronic acid], Sulfa antibiotics, Welchol [colesevelam], Zetia [ezetimibe], Niacin and related, and Tetracyclines & related  Review of Systems   Review of Systems  Constitutional:  Positive for fatigue. Negative for chills and fever.  HENT:  Negative for congestion.   Eyes:  Negative for visual disturbance.  Respiratory:  Negative for chest tightness and shortness of breath.   Cardiovascular:  Positive for palpitations. Negative for chest pain and leg swelling.  Gastrointestinal:  Negative for abdominal pain, diarrhea and vomiting.  Genitourinary:  Negative for dysuria.  Skin:  Negative for rash.  Neurological:  Negative for headaches.   Physical Exam Updated Vital Signs BP 126/76 (BP Location: Left Arm)   Pulse (!) 105   Temp 97.9 F (36.6 C) (Oral)   Resp 17   Ht 5' (1.524 m)   Wt 78.9 kg   SpO2 100%   BMI 33.98 kg/m   Physical Exam Vitals and nursing note reviewed.  Constitutional:      General: She is not in acute distress.    Appearance: Normal appearance. She is not diaphoretic.  HENT:     Head: Normocephalic.     Mouth/Throat:     Mouth: Mucous membranes are moist.  Cardiovascular:     Rate and Rhythm: Tachycardia present. Rhythm irregular.  Pulmonary:     Effort: Pulmonary effort is normal. No tachypnea or respiratory distress.  Abdominal:     Palpations: Abdomen is soft.     Tenderness: There is no abdominal tenderness.  Musculoskeletal:     Right lower leg: No edema.     Left lower leg: No edema.  Skin:    General: Skin is warm.  Neurological:     Mental Status: She is alert and oriented to person, place, and time. Mental status is at baseline.  Psychiatric:        Mood and Affect: Mood normal.    ED Results / Procedures / Treatments   Labs (all labs ordered are listed, but only abnormal results are displayed) Labs Reviewed  CBC WITH DIFFERENTIAL/PLATELET  BASIC  METABOLIC PANEL  TROPONIN I (HIGH SENSITIVITY)    EKG None  Radiology No results found.  Procedures Procedures   Medications Ordered in ED Medications  sodium chloride 0.9 % bolus 500 mL (has no administration in time range)  diltiazem (CARDIZEM) injection 10 mg (has no administration in time range)    ED Course  I have reviewed the triage vital signs and the nursing notes.  Pertinent labs & imaging results that were available during my care of the patient were reviewed by me and considered in my  medical decision making (see chart for details).    MDM Rules/Calculators/A&P                           86 year old female presents emergency department with difficulty catching her breath and palpitations.  She is noted to be atrial fibrillation on arrival, tachycardic to the low 100s, stable blood pressure.  No ongoing chest pain.  Blood work is reassuring, electrolytes normal, magnesium is normal, for troponin is negative.  Chest x-ray shows no acute finding.  After fluids and a dose of Cardizem patient has converted back to normal sinus rhythm.  Spoke with her cardiologist Dr. Einar Gip who recommends anticoagulation and changing her medications.  I have sent new prescriptions and they will follow-up with her in the office next week.  Patient at this time appears safe and stable for discharge and will be treated as an outpatient.  Discharge plan and strict return to ED precautions discussed, patient verbalizes understanding and agreement.  Final Clinical Impression(s) / ED Diagnoses Final diagnoses:  None    Rx / DC Orders ED Discharge Orders     None        Lorelle Gibbs, DO 08/15/20 1341

## 2020-08-15 NOTE — ED Triage Notes (Signed)
Pt states she feels like she is not getting in enough air.  She used her Advair inhaler at home this morning with no noted relief.  Pt appears in NAD, brought into exam room by wheelchair but transferred to stretcher with no assistance.

## 2020-08-15 NOTE — Telephone Encounter (Signed)
Patient woke up with palpitations and presented to the emergency room this morning, and was found to be in A. fib with RVR.  Patient self converted to sinus rhythm.  Amlodipine discontinued and switched to diltiazem CD1 20 mg daily, she will continue metoprolol succinate 100 g p.o. daily.  She will be started on anticoagulation.  Please set up an office visit with [place] within the next 1 week.   Adrian Prows, MD, [facility] 08/15/2020, 1:12 PM Office: [phone] Fax: [phone] Pager: [phone]

## 2020-08-15 NOTE — Discharge Instructions (Addendum)
You have been seen and discharged from the emergency department.  Your heart was in atrial fibrillation rhythm.  It has converted back to normal sinus rhythm.  I spoke with Dr. Einar Gip who recommends changing your medications.  Discontinue the amlodipine (Norvasc).  Continue taking the metoprolol (Toprol) as currently prescribed.  Add Cardizem and take 1 capsule daily.  You will also start taking Xarelto 1 tab daily.  Cardiology wants to follow-up with you next week, call their office on Monday.  Take home medications as prescribed. If you have any worsening symptoms, shortness of breath, chest pain or further concerns for your health please return to an emergency department for further evaluation.

## 2020-08-15 NOTE — ED Notes (Signed)
Patient verbalizes understanding of discharge instructions. Opportunity for questioning and answers were provided. Patient discharged from ED.  °

## 2020-08-18 ENCOUNTER — Encounter: Payer: Self-pay | Admitting: Cardiology

## 2020-08-18 ENCOUNTER — Ambulatory Visit: Payer: Medicare Other | Admitting: Cardiology

## 2020-08-18 ENCOUNTER — Other Ambulatory Visit: Payer: Self-pay

## 2020-08-18 VITALS — BP 134/58 | HR 64 | Temp 98.0°F | Resp 17 | Ht 60.0 in | Wt 174.6 lb

## 2020-08-18 DIAGNOSIS — I480 Paroxysmal atrial fibrillation: Secondary | ICD-10-CM

## 2020-08-18 DIAGNOSIS — I10 Essential (primary) hypertension: Secondary | ICD-10-CM | POA: Diagnosis not present

## 2020-08-18 DIAGNOSIS — I6523 Occlusion and stenosis of bilateral carotid arteries: Secondary | ICD-10-CM | POA: Diagnosis not present

## 2020-08-18 MED ORDER — DILTIAZEM HCL ER COATED BEADS 120 MG PO CP24: 120.0000 mg | ORAL_CAPSULE | Freq: Every day | ORAL | 3 refills | Status: DC

## 2020-08-18 MED ORDER — RIVAROXABAN 20 MG PO TABS
20.0000 mg | ORAL_TABLET | Freq: Every day | ORAL | 3 refills | Status: DC
Start: 2020-08-18 — End: 2021-02-16

## 2020-08-18 NOTE — Progress Notes (Signed)
Primary Physician/Referring:  Merri Brunette, MD  Patient ID: Deborah Mcmillan, female    DOB: 02-17-1934, 86 y.o.   MRN: [id number]  Chief Complaint  Patient presents with   Follow-up   Atrial Fibrillation   HPI:    Deborah Mcmillan  is a 86 y.o.  Caucasian female with carotid atheresclerosis and hypertension and hyperlipidemia, LDL in the range of 170-190, however she has not been able to tolerate any statins.  She is now on Praluent. She has bronchial asthma and multiple medication allergies and intolerances.    She had been doing well until 08/15/2020 presented to the emergency room with shortness of breath and palpitations when she woke up from her sleep.  In the ED she was found to be in A. fib with RVR.  However patient spontaneously converted to sinus rhythm and was discharged home with discontinued amlodipine and started on diltiazem CD1 80 mg daily along with Eliquis 5 mg p.o. twice daily.  She now presents to the office for follow-up.  She is tolerating the medications well, states that she is actually sleeping better.  No bleeding diathesis on Eliquis.  She remains asymptomatic.  Past Medical History:  Diagnosis Date   Dyslipidemia    Dysrhythmia    h/o atrial couplets with short PAT, h/o a-fib during surgery (event monitor)   GERD (gastroesophageal reflux disease)    History of nuclear stress test 05/2009   dipyridamole; normal, low risk    Hypertension    Osteoporosis    Postoperative atrial fibrillation (HCC)  2011   Seasonal allergies    Past Surgical History:  Procedure Laterality Date   APPENDECTOMY  1961   BLADDER SURGERY  2002   BREAST BIOPSY Left 05/09/2012   calcified and hyalinized fibroadenomatous and sclerosing adenosis   COSMETIC SURGERY  2011   1 episode of atrial fibrillation during this    HERNIA REPAIR  1999   hiatal   lump removal  05/06/2009   [facility]   NM MYOVIEW LTD  May 2011   No ischemia or infarction.   SHOULDER SURGERY Left Feb 2015   Dr.  Ranell Patrick - arthroscopy (rotator cuff ?debridement plus biceps tenodesis)   TRANSTHORACIC ECHOCARDIOGRAM  05/2009   EF=>55%, with normal LV systolic function, impaired LV relaxation; trace MR/TR; mild AV regurg   Family History  Problem Relation Age of Onset   Cancer Father    Cancer - Other Father    Cancer Brother    Cancer - Other Brother    Stroke Mother    Cancer - Other Sister    Cancer - Other Sister    Breast cancer Neg Hx     Social History   Tobacco Use   Smoking status: Never   Smokeless tobacco: Never  Substance Use Topics   Alcohol use: No   ROS  Review of Systems  Cardiovascular:  Negative for chest pain, dyspnea on exertion and leg swelling.  Gastrointestinal:  Negative for melena.  Objective  Blood pressure (!) 134/58, pulse 64, temperature 98 F (36.7 C), temperature source Temporal, resp. rate 17, height 5' (1.524 m), weight 174 lb 9.6 oz (79.2 kg), SpO2 97 %.  Vitals with BMI 08/18/2020 08/18/2020 08/15/2020  Height - 5\' 0"  -  Weight - 174 lbs 10 oz -  BMI - 34.1 -  Systolic 134 146 -  Diastolic 58 58 -  Pulse 64 72 59     Physical Exam Neck:     Vascular: No  carotid bruit or JVD.  Cardiovascular:     Rate and Rhythm: Normal rate and regular rhythm.     Pulses: Intact distal pulses.     Heart sounds: Normal heart sounds. No murmur heard.   No gallop.  Pulmonary:     Effort: Pulmonary effort is normal.     Breath sounds: Normal breath sounds.  Abdominal:     General: Bowel sounds are normal.     Palpations: Abdomen is soft.  Musculoskeletal:        General: No swelling.   Laboratory examination:   Recent Labs    08/15/20 1104  NA 138  K 4.5  CL 102  CO2 25  GLUCOSE 110*  BUN 19  CREATININE 0.91  CALCIUM 9.7  GFRNONAA >60   estimated creatinine clearance is 42.9 mL/min (by C-G formula based on SCr of 0.91 mg/dL).  CMP Latest Ref Rng & Units 08/15/2020 12/25/2017  Glucose 70 - 99 mg/dL 110(H) 141(H)  BUN 8 - 23 mg/dL 19 23  Creatinine 0.44  - 1.00 mg/dL 0.91 0.86  Sodium 135 - 145 mmol/L 138 138  Potassium 3.5 - 5.1 mmol/L 4.5 3.6  Chloride 98 - 111 mmol/L 102 101  CO2 22 - 32 mmol/L 25 20(L)  Calcium 8.9 - 10.3 mg/dL 9.7 9.4   CBC Latest Ref Rng & Units 08/15/2020 12/25/2017  WBC 4.0 - 10.5 K/uL 10.3 8.5  Hemoglobin 12.0 - 15.0 g/dL 15.7(H) 13.9  Hematocrit 36.0 - 46.0 % 46.7(H) 44.3  Platelets 150 - 400 K/uL 241 232    External labs:     Labs 02/12/2019:  Hb 14.6/HCT 43.1, platelets 225, normal indicis.  Sodium 142, potassium 4.0, BUN 24, creatinine 0.9, EGFR 63 mL. CMP normal.  Total cholesterol 155, triglycerides 133, HDL 64, LDL 64.  Medications and allergies   Allergies  Allergen Reactions   Calcium-Containing Compounds Other (See Comments)    Muscle cramping   Magnesium-Containing Compounds Itching and Rash   Tape Itching and Rash    Plastic, and "brown tape"   Cefuroxime Axetil     Other reaction(s): leg pain   Codeine     Pain & upset stomach   Crestor [Rosuvastatin]     Statin intolerance   Lodine [Etodolac]     Upset stomach   Penicillins    Reclast [Zoledronic Acid] Other (See Comments)    Caused severe jaw pain   Sulfa Antibiotics     Pain & upset stomach   Welchol [Colesevelam] Nausea Only and Other (See Comments)    Stomach did not feel good   Zetia [Ezetimibe]    Niacin And Related Rash    "on fire"   Tetracyclines & Related Rash    Upset stomach     Current Outpatient Medications on File Prior to Visit  Medication Sig Dispense Refill   albuterol (VENTOLIN HFA) 108 (90 Base) MCG/ACT inhaler Inhale 1 puff into the lungs as needed.     Cholecalciferol (VITAMIN D) 2000 UNITS CAPS Take 1 capsule by mouth daily.     denosumab (PROLIA) 60 MG/ML SOSY injection Inject 60 mg into the skin every 6 (six) months.     fluticasone-salmeterol (ADVAIR HFA) 230-21 MCG/ACT inhaler Inhale 2 puffs into the lungs 2 (two) times daily. 1 each 12   Magnesium 400 MG CAPS Take 400-800 mg by mouth daily.      metoprolol succinate (TOPROL-XL) 100 MG 24 hr tablet TAKE 1 TABLET(100 MG) BY MOUTH EVERY EVENING 90 tablet 0  omeprazole (PRILOSEC) 20 MG capsule Take 20 mg by mouth daily.     PRALUENT 75 MG/ML SOAJ INJECT 75 MG UNDER THE SKIN EVERY 14 DAYS 6 mL 3   spironolactone (ALDACTONE) 50 MG tablet TAKE 1 TABLET BY MOUTH EVERY MORNING 90 tablet 3   No current facility-administered medications on file prior to visit.    Radiology:   No results found.  Cardiac Studies:   Carotid artery duplex 2015/10/08: No hemodynamically significant arterial disease in the internal carotid artery bilaterally. Minimal mixed plaque noted. Antegrade vertebral artery flow.  Nuclear stress test   [12/11/2015]:  1. The resting electrocardiogram demonstrated normal sinus rhythm, normal resting conduction, no resting arrhythmias and normal rest repolarization. Stress EKG is non-diagnostic for ischemia as it a pharmacologic stress using Lexiscan. Stress symptoms included dyspnea. 2. Myocardial perfusion imaging is normal. Overall left ventricular systolic function was normal without regional wall motion abnormalities. The left ventricular ejection fraction was 58%.  PCV ECHOCARDIOGRAM COMPLETE 83/09/4074 Normal LV systolic function with visual EF 60-65%. Left ventricle cavity is normal in size. Normal global wall motion. Indeterminate diastolic filling pattern, normal LAP. Left atrial cavity is mildly dilated. Mild (Grade I) aortic regurgitation. Mild (Grade I) mitral regurgitation. Mild tricuspid regurgitation. No evidence of pulmonary hypertension. RVSP measures 30 mmHg. Mild pulmonic regurgitation. Prior study dated 12/09/2015: LVEF 80%, normal diastolic function, mild LAE, mild to moderate AR, Mild to moderate MR, Mild TR, no PHTN.     EKG  EKG 08/18/2020: Normal sinus rhythm at rate of 62 bpm, normal axis.  No evidence of ischemia, normal EKG.  No change from 01/24/2020.  EKG 08/15/2020: Atrial fibrillation  with rapid ventricular sponsor at the rate of 110 bpm, normal axis, no evidence of ischemia.  Assessment     ICD-10-CM   1. Paroxysmal atrial fibrillation (HCC)  I48.0 EKG 12-Lead    diltiazem (CARDIZEM CD) 120 MG 24 hr capsule    rivaroxaban (XARELTO) 20 MG TABS tablet    2. Essential hypertension  I10 diltiazem (CARDIZEM CD) 120 MG 24 hr capsule      CHA2DS2-VASc Score is 4.  Yearly risk of stroke: 4.8% (A, HTN, Female).  Score of 1=0.6; 2=2.2; 3=3.2; 4=4.8; 5=7.2; 6=9.8; 7=>9.8) -(CHF; HTN; vasc disease DM,  Female = 1; Age <65 =0; 65-74 = 1,  >75 =2; stroke/embolism= 2).    Meds ordered this encounter  Medications   diltiazem (CARDIZEM CD) 120 MG 24 hr capsule    Sig: Take 1 capsule (120 mg total) by mouth daily.    Dispense:  90 capsule    Refill:  3   rivaroxaban (XARELTO) 20 MG TABS tablet    Sig: Take 1 tablet (20 mg total) by mouth daily with supper.    Dispense:  90 tablet    Refill:  3    Medications Discontinued During This Encounter  Medication Reason   diltiazem (CARDIZEM CD) 120 MG 24 hr capsule Reorder   rivaroxaban (XARELTO) 20 MG TABS tablet Reorder   Orders Placed This Encounter  Procedures   EKG 12-Lead      Recommendations:   Deborah Mcmillan  is a 86 y.o. Caucasian female with carotid atheresclerosis and hypertension and hyperlipidemia, LDL in the range of 170-190, however she has not been able to tolerate any statins.  She is now on Praluent. She has bronchial asthma and multiple medication allergies and intolerances.  H/O A.. Fib post op  05/06/2009 after minor surgery to skin.   She had  been doing well until 08/15/2020 presented to the emergency room with shortness of breath "difficulty in getting air in", fatigue and palpitations when she woke up from her sleep, found to be in A. fib with RVR.  However patient spontaneously converted to sinus rhythm.   She was discharged home with discontinued amlodipine and started on diltiazem CD1 80 mg daily along  with Eliquis 5 mg p.o. twice daily.  She now presents to the office for follow-up.  She is tolerating diltiazem low-dose along with metoprolol combination is maintaining sinus rhythm.  Continue both Eliquis and diltiazem for now.  Blood pressures well controlled.  She has had recent echocardiogram and no significant valvular abnormality.  I do not think she needs a stress test.  I will see her back in 6 months.    She has been scheduled for elective colonoscopy soon due to family history of colonic cancer, she is also scheduled for cholecystectomy in the near future.  I do not think she is at high risk from either of these procedures, when I do get surgical information I will send preoperative low risk letter to the concerned physicians.      Adrian Prows, MD, [facility] 08/18/2020, 2:13 PM Office: [phone] Pager: [phone]

## 2020-08-20 ENCOUNTER — Encounter: Payer: Self-pay | Admitting: Cardiology

## 2020-09-11 ENCOUNTER — Ambulatory Visit: Payer: Self-pay | Admitting: Surgery

## 2020-09-11 DIAGNOSIS — K8050 Calculus of bile duct without cholangitis or cholecystitis without obstruction: Secondary | ICD-10-CM | POA: Diagnosis not present

## 2020-09-11 NOTE — H&P (Signed)
Deborah Mcmillan [id number]    Referring Provider:  Self     Subjective    Chief Complaint: No chief complaint on file.       History of Present Illness:    86 year old woman with history of atrial fibrillation, hypertension, carotid atherosclerosis, bronchial asthma, multiple medication allergies and intolerances, GERD status post laparoscopic hiatal hernia repair and Nissen in 1999, osteoporosis presents for evaluation of symptomatic gallstones.  Her gastroenterologist is Dr. Therisa Doyne.  She was seen there a few months ago noting concerns with constipation and desiring screening colonoscopy given prevalent family history of colon cancer.  She did note some right upper quadrant pain and subsequently underwent an ultrasound in May of this year which demonstrates cholelithiasis without any sonographic evidence of cholecystitis, common bile duct 3 mm, and diffuse heterogeneity of the hepatic parenchyma which may be a nonspecific indicator of hepatocellular dysfunction.   She presented to the emergency room about 1 month ago with shortness of breath and palpitations and was found to be in A. fib with RVR which spontaneously converted to sinus rhythm.  She is on Eliquis.  Chest x-ray about 1 month ago with borderline cardiomegaly which is stable, left basilar atelectasis.  She was seen last month by her cardiologist Dr. Einar Gip and he does note that he would consider her to be low risk for cholecystectomy. She also sees Dr. Richmond Campbell with pulmonology and is due for follow-up, noting that she was supposed to have had some pulmonary function testing.   She describes pain in the right subcostal region, epigastrium, and radiating to her right mid back as well as down into her right lower quadrant.  This does occur after eating but not always.  She has not identified any specific foods that irritate it.  She notes that she could be just walking around and the pain will come on from that as well.  The pain is  aggravated also by the band of her bra.   Previous abdominal surgeries include appendectomy, bladder surgery, hiatal hernia repair 1999   Review of Systems: A complete review of systems was obtained from the patient.  I have reviewed this information and discussed as appropriate with the patient.  See HPI as well for other ROS.     Medical History: Past Medical History      Past Medical History:  Diagnosis Date   Asthma, unspecified asthma severity, unspecified whether complicated, unspecified whether persistent     GERD (gastroesophageal reflux disease)     Hypertension          There is no problem list on file for this patient.     Past Surgical History  History reviewed. No pertinent surgical history.      Allergies       Allergies  Allergen Reactions   Codeine Vomiting      Pain & upset stomach     Colesevelam Nausea and Other (See Comments)      Stomach did not feel good     Ezetimibe Other (See Comments)   Niacin Rash   Penicillins Rash   Sulfa (Sulfonamide Antibiotics) Vomiting   Zoledronic Acid Other (See Comments)      Caused severe jaw pain              Current Outpatient Medications on File Prior to Visit  Medication Sig Dispense Refill   alirocumab (PRALUENT PEN) 75 mg/mL pen injector Praluent Pen 75 mg/mL subcutaneous pen injector  INJECT  75 MG UNDER THE SKIN EVERY 14 DAYS       fluticasone propion-salmeteroL (ADVAIR HFA) 230-21 mcg/actuation inhaler Advair HFA 230 mcg-21 mcg/actuation aerosol inhaler       metoprolol succinate (TOPROL-XL) 100 MG XL tablet metoprolol succinate ER 100 mg tablet,extended release 24 hr       spironolactone (ALDACTONE) 50 MG tablet spironolactone 50 mg tablet       albuterol 90 mcg/actuation inhaler albuterol sulfate HFA 90 mcg/actuation aerosol inhaler  INHALE 2 PUFFS BY MOUTH EVERY 6 HOURS FOR 25 DAYS AS NEEDED       omeprazole (PRILOSEC) 20 MG DR capsule omeprazole 20 mg capsule,delayed release        No current  facility-administered medications on file prior to visit.      Family History       Family History  Problem Relation Age of Onset   Skin cancer Mother     High blood pressure (Hypertension) Mother     Hyperlipidemia (Elevated cholesterol) Father     Colon cancer Sister     Hyperlipidemia (Elevated cholesterol) Brother     Colon cancer Brother          Social History       Tobacco Use  Smoking Status Never Smoker  Smokeless Tobacco Never Used      Social History  Social History        Socioeconomic History   Marital status: Single  Tobacco Use   Smoking status: Never Smoker   Smokeless tobacco: Never Used  Scientific laboratory technician Use: Never used  Substance and Sexual Activity   Alcohol use: Not Currently   Drug use: Defer   Sexual activity: Defer        Objective:         Vitals:    09/11/20 1110  BP: 138/70  Pulse: 77  Temp: 36.8 C (98.3 F)  SpO2: 98%  Weight: 78.7 kg (173 lb 6.4 oz)  Height: 152.4 cm (5')    Body mass index is 33.86 kg/m.   Alert, well-appearing Unlabored respirations Extraocular motions intact, no scleral icterus Abdomen is soft, focally tender in the right costal margin and subcostal area, non distended.   Assessment and Plan:  Diagnoses and all orders for this visit:   Biliary colic -     CCS Case Posting Request; Future     She does have fairly classic symptoms of right upper quadrant pain radiating to the epigastrium into the back, that occurs almost immediately after eating frequently but not always.  I discussed with her that activity is not a typical aggravating factor for pain from biliary etiology, and she may have a musculoskeletal component to this as well given her history of osteoporosis.  Given the overall picture I do think it would be reasonable to proceed with laparoscopic cholecystectomy.  We discussed the technique and discussed the relevant anatomy using a diagram to demonstrate.  Discussed risks of surgery  including bleeding, pain, scarring, intraabdominal injury specifically to the common bile duct and sequelae, bile leak, conversion to open surgery, failure to resolve symptoms, blood clots/ pulmonary embolus, heart attack, arrhythmia, pneumonia, stroke, death. Questions welcomed and answered to patient's satisfaction.  She is agreeable to proceed with surgery.  We will request clearance from her pulmonologist and her cardiologist.       Bobbe Medico, MD

## 2020-09-12 ENCOUNTER — Other Ambulatory Visit: Payer: Self-pay | Admitting: Cardiology

## 2020-09-17 ENCOUNTER — Encounter: Payer: Self-pay | Admitting: Student

## 2020-09-24 ENCOUNTER — Other Ambulatory Visit: Payer: Self-pay

## 2020-09-24 ENCOUNTER — Ambulatory Visit: Payer: Medicare Other

## 2020-09-24 DIAGNOSIS — J4540 Moderate persistent asthma, uncomplicated: Secondary | ICD-10-CM

## 2020-09-24 DIAGNOSIS — R0609 Other forms of dyspnea: Secondary | ICD-10-CM

## 2020-09-24 DIAGNOSIS — R0600 Dyspnea, unspecified: Secondary | ICD-10-CM

## 2020-09-24 LAB — PULMONARY FUNCTION TEST
DL/VA % pred: 95 %
DL/VA: 3.97 ml/min/mmHg/L
DLCO cor % pred: 82 %
DLCO cor: 13.31 ml/min/mmHg
DLCO unc % pred: 87 %
DLCO unc: 14.17 ml/min/mmHg
FEF 25-75 Post: 1.48 L/sec
FEF 25-75 Pre: 0.95 L/sec
FEF2575-%Change-Post: 56 %
FEF2575-%Pred-Post: 151 %
FEF2575-%Pred-Pre: 96 %
FEV1-%Change-Post: 7 %
FEV1-%Pred-Post: 96 %
FEV1-%Pred-Pre: 89 %
FEV1-Post: 1.38 L
FEV1-Pre: 1.28 L
FEV1FVC-%Change-Post: 5 %
FEV1FVC-%Pred-Pre: 104 %
FEV6-%Change-Post: 2 %
FEV6-%Pred-Post: 93 %
FEV6-%Pred-Pre: 91 %
FEV6-Post: 1.71 L
FEV6-Pre: 1.68 L
FEV6FVC-%Change-Post: 0 %
FEV6FVC-%Pred-Post: 107 %
FEV6FVC-%Pred-Pre: 107 %
FVC-%Change-Post: 1 %
FVC-%Pred-Post: 87 %
FVC-%Pred-Pre: 85 %
FVC-Post: 1.71 L
FVC-Pre: 1.68 L
Post FEV1/FVC ratio: 80 %
Post FEV6/FVC ratio: 100 %
Pre FEV1/FVC ratio: 76 %
Pre FEV6/FVC Ratio: 100 %

## 2020-09-25 ENCOUNTER — Ambulatory Visit (INDEPENDENT_AMBULATORY_CARE_PROVIDER_SITE_OTHER): Payer: Medicare Other | Admitting: Pulmonary Disease

## 2020-09-25 ENCOUNTER — Telehealth: Payer: Self-pay | Admitting: Pulmonary Disease

## 2020-09-25 ENCOUNTER — Encounter: Payer: Self-pay | Admitting: Pulmonary Disease

## 2020-09-25 VITALS — BP 124/60 | HR 71 | Temp 98.9°F | Ht 60.0 in | Wt 172.8 lb

## 2020-09-25 DIAGNOSIS — J4540 Moderate persistent asthma, uncomplicated: Secondary | ICD-10-CM

## 2020-09-25 MED ORDER — BUDESONIDE-FORMOTEROL FUMARATE 160-4.5 MCG/ACT IN AERO: 2.0000 | INHALATION_SPRAY | Freq: Two times a day (BID) | RESPIRATORY_TRACT | 12 refills | Status: DC

## 2020-09-25 MED ORDER — ALBUTEROL SULFATE HFA 108 (90 BASE) MCG/ACT IN AERS: 1.0000 | INHALATION_SPRAY | RESPIRATORY_TRACT | 11 refills | Status: DC | PRN

## 2020-09-25 MED ORDER — ALBUTEROL SULFATE HFA 108 (90 BASE) MCG/ACT IN AERS: 1.0000 | INHALATION_SPRAY | RESPIRATORY_TRACT | 11 refills | Status: AC | PRN

## 2020-09-25 NOTE — Telephone Encounter (Signed)
New Ventolin prescription sent to pharmacy with as needed instructions.  Nothing further at this time.

## 2020-09-25 NOTE — Patient Instructions (Signed)
Use Symbicort 2 puffs twice a day. Rinse mouth after every use.  Stop Advair.  I refilled the albuterol rescue inhaler.  Return to clinic in 3 months or sooner as needed

## 2020-09-25 NOTE — Progress Notes (Signed)
$'@Patient'p$  ID: [place] L Greear, female    DOB: 15-Sep-1934, 86 y.o.   MRN: [id number]  Chief Complaint  Patient presents with   Follow-up    Surgery clearance, advair makes her horse and creates sores in her mouth     Referring provider: Deland Pretty, MD  HPI:   86 y.o. woman whom we are seeing in consultation for evaluation of dyspnea on exertion.  Recent surgery note reviewed..  Most recent cardiology note reviewed.  Patient continues with dyspnea.  Has been using Advair. Really tolerate once a day as it causes mouth sores.  Or she attributes mouth sores to this medication change.  She notices her breathing is a bit worse when she does not use the inhaler.  Inhaler is helped some but still with significant dyspnea.  PFTs performed yesterday.  Reviewed in detail.  PFTs essentially normal.  She has upcoming gallbladder surgery planned.  HPI initial visit Patient has chronic dyspnea on exertion over the last couple years or so.  Maybe slight worsening over the last few months.  She thinks she had COVID prior to reliable testing in late 2019.  She again had in the fall 2020.  Since then her breathing has been worse, dyspnea on exertion.  Again had COVID early 2022.  Breathing worse with stairs or inclines.  Can be present over longer distance flat surface.  Relieved by rest.  Using Asmanex for her asthma.  This is not made much change in her dyspnea.  No timing during the day where things are better or worse.  No environmental factors to account for the change.  She does note seasonal allergies and may be breathing a bit worse when pollen in the spring.  Has been more congested over the last few weeks with pollen season.  No other alleviating or exacerbating factors.  Dyspnea described as moderate.  She does note she is much less active over the last year or 2 compared to prepandemic.  More times indoors.  Also notes mild 10 to 15 pound weight gain.  Chest x-ray most recent chest imaging in 2019  reviewed interpreted as clear lungs, no effusion, infiltrate, pneumothorax.  PMH: Asthma, hypertension, GERD Surgical history: Appendectomy, shoulder surgery, bladder surgery, Family history: Mother brother and sister with various forms of cancer, no pulmonary disease in first-degree relatives Social history: Never smoker, retired, lives in [place] / Pulmonary Flowsheets:   ACT:  No flowsheet data found.  MMRC: No flowsheet data found.  Epworth:  No flowsheet data found.  Tests:   FENO:  No results found for: NITRICOXIDE  PFT: PFT Results Latest Ref Rng & Units 09/24/2020  FVC-Pre L 1.68  FVC-Predicted Pre % 85  FVC-Post L 1.71  FVC-Predicted Post % 87  Pre FEV1/FVC % % 76  Post FEV1/FCV % % 80  FEV1-Pre L 1.28  FEV1-Predicted Pre % 89  FEV1-Post L 1.38  DLCO uncorrected ml/min/mmHg 14.17  DLCO UNC% % 87  DLCO corrected ml/min/mmHg 13.31  DLCO COR %Predicted % 82  DLVA Predicted % 95  Personally reviewed and interpreted as normal spirometry.  No bronchodilator response.  DLCO within normal limits.  WALK:  No flowsheet data found.  Imaging: Personally reviewed and as per EMR discussion this note   Lab Results: Personally reviewed, eosinophils 200 8/22 CBC    Component Value Date/Time   WBC 10.3 08/15/2020 1104   RBC 5.11 08/15/2020 1104   HGB 15.7 (H) 08/15/2020 1104   HCT 46.7 (  H) 08/15/2020 1104   PLT 241 08/15/2020 1104   MCV 91.4 08/15/2020 1104   MCH 30.7 08/15/2020 1104   MCHC 33.6 08/15/2020 1104   RDW 12.4 08/15/2020 1104   LYMPHSABS 3.1 08/15/2020 1104   MONOABS 0.8 08/15/2020 1104   EOSABS 0.2 08/15/2020 1104   BASOSABS 0.1 08/15/2020 1104    BMET    Component Value Date/Time   NA 138 08/15/2020 1104   K 4.5 08/15/2020 1104   CL 102 08/15/2020 1104   CO2 25 08/15/2020 1104   GLUCOSE 110 (H) 08/15/2020 1104   BUN 19 08/15/2020 1104   CREATININE 0.91 08/15/2020 1104   CALCIUM 9.7 08/15/2020 1104   GFRNONAA >60  08/15/2020 1104   GFRAA >60 12/25/2017 0600    BNP No results found for: BNP  ProBNP No results found for: PROBNP  Specialty Problems   None  Allergies  Allergen Reactions   Calcium-Containing Compounds Other (See Comments)    Muscle cramping   Magnesium-Containing Compounds Itching and Rash   Tape Itching and Rash    Plastic, and "brown tape"   Cefuroxime Axetil     Other reaction(s): leg pain   Codeine     Pain & upset stomach   Crestor [Rosuvastatin]     Statin intolerance   Lodine [Etodolac]     Upset stomach   Penicillins    Reclast [Zoledronic Acid] Other (See Comments)    Caused severe jaw pain   Sulfa Antibiotics     Pain & upset stomach   Welchol [Colesevelam] Nausea Only and Other (See Comments)    Stomach did not feel good   Zetia [Ezetimibe]    Niacin And Related Rash    "on fire"   Tetracyclines & Related Rash    Upset stomach    Immunization History  Administered Date(s) Administered   Influenza Split 10/14/2012   Influenza-Unspecified 10/11/2017   PFIZER(Purple Top)SARS-COV-2 Vaccination 03/29/2019, 04/23/2019, 01/01/2020    Past Medical History:  Diagnosis Date   Dyslipidemia    Dysrhythmia    h/o atrial couplets with short PAT, h/o a-fib during surgery (event monitor)   GERD (gastroesophageal reflux disease)    History of nuclear stress test 05/2009   dipyridamole; normal, low risk    Hypertension    Osteoporosis    Postoperative atrial fibrillation ([place])  2011   Seasonal allergies     Tobacco History: Social History   Tobacco Use  Smoking Status Never  Smokeless Tobacco Never   Counseling given: Not Answered   Continue to not smoke  Outpatient Encounter Medications as of 09/25/2020  Medication Sig   budesonide-formoterol (SYMBICORT) 160-4.5 MCG/ACT inhaler Inhale 2 puffs into the lungs 2 (two) times daily.   denosumab (PROLIA) 60 MG/ML SOSY injection Inject 60 mg into the skin every 6 (six) months.   diltiazem (CARDIZEM  CD) 120 MG 24 hr capsule Take 1 capsule (120 mg total) by mouth daily.   Magnesium 400 MG CAPS Take 400-800 mg by mouth daily.   metoprolol succinate (TOPROL-XL) 100 MG 24 hr tablet TAKE 1 TABLET(100 MG) BY MOUTH EVERY EVENING   omeprazole (PRILOSEC) 20 MG capsule Take 20 mg by mouth daily.   OVER THE COUNTER MEDICATION Apple Cider Vingar   PRALUENT 75 MG/ML SOAJ INJECT 75 MG UNDER THE SKIN EVERY 14 DAYS   rivaroxaban (XARELTO) 20 MG TABS tablet Take 1 tablet (20 mg total) by mouth daily with supper.   spironolactone (ALDACTONE) 50 MG tablet TAKE 1 TABLET BY  MOUTH EVERY MORNING   [DISCONTINUED] albuterol (VENTOLIN HFA) 108 (90 Base) MCG/ACT inhaler Inhale 1 puff into the lungs as needed.   [DISCONTINUED] fluticasone-salmeterol (ADVAIR HFA) 230-21 MCG/ACT inhaler Inhale 2 puffs into the lungs 2 (two) times daily.   albuterol (VENTOLIN HFA) 108 (90 Base) MCG/ACT inhaler Inhale 1 puff into the lungs as needed.   Cholecalciferol (VITAMIN D) 2000 UNITS CAPS Take 1 capsule by mouth daily. (Patient not taking: Reported on 09/25/2020)   No facility-administered encounter medications on file as of 09/25/2020.     Review of Systems  Review of Systems  N/A Physical Exam  BP 124/60 (BP Location: Right Arm, Patient Position: Sitting, Cuff Size: Normal)   Pulse 71   Temp 98.9 F (37.2 C) (Oral)   Ht 5' (1.524 m)   Wt 172 lb 12.8 oz (78.4 kg)   SpO2 99%   BMI 33.75 kg/m   Wt Readings from Last 5 Encounters:  09/25/20 172 lb 12.8 oz (78.4 kg)  08/18/20 174 lb 9.6 oz (79.2 kg)  08/15/20 174 lb (78.9 kg)  05/09/20 174 lb (78.9 kg)  01/24/20 179 lb 3.2 oz (81.3 kg)    BMI Readings from Last 5 Encounters:  09/25/20 33.75 kg/m  08/18/20 34.10 kg/m  08/15/20 33.98 kg/m  05/09/20 33.98 kg/m  01/24/20 35.00 kg/m     Physical Exam General: Well-appearing, no acute distress Eyes: EOMI, icterus Neck: Supple no JVP Cardiovascular: Regular rate and rhythm, no murmurs Pulmonary: Clear to  auscultation bilaterally, normal work of breathing on room air Neuro: Normal gait, no weakness Psych: Normal mood, full affect   Assessment & Plan:   DOE: Likely multifactorial in the setting of known prior asthma, mild weight gain, deconditioning with decreased activity.  Chest x-ray clear.  PFTs normal.  Asthma:  Well-controlled over time.  However, fear that COVID-19 infection 2022 triggered worsening asthma symptoms.  Avoiding Breo given allergy to magnesium stearate.  Advair caused mouth sores.  New prescription Symbicort today.  Assess response to therapy.  Consider phenotyping.  Eosinophils 200 8/22.  Check IgE next visit if not well controlled, does have history of atopic symptoms, histaminergic sounding rashes.  Preoperative evaluation: Pulmonary medicine does not provide clearance but rather preoperative risk assessment. Based on ARISCAT risk model she is intermediate or 13.3% of postop pulmonary complication.  There are no modifiable risk factors, the location of incision as well as age dictate entirety of her surgical risk.  Please see below for recommendations in the perioperative period. -- Please administer DuoNebs prior to surgery --Consider checking arterial blood gas during case, if retaining CO2 recommend extubation to BiPAP -- If CO2 retention noted intraoperatively, recommend checking postoperative arterial blood gas --Please administer DuoNebs postoperatively --If admitted to the hospital postoperatively, please order budesonide nebulized twice a day and arformoterol nebulized twice a day in addition to as needed DuoNebs    Return in about 3 months (around 12/25/2020).   Lanier Clam, MD 09/25/2020

## 2020-09-26 ENCOUNTER — Telehealth: Payer: Self-pay | Admitting: Pulmonary Disease

## 2020-09-26 DIAGNOSIS — E7800 Pure hypercholesterolemia, unspecified: Secondary | ICD-10-CM | POA: Diagnosis not present

## 2020-09-26 NOTE — Telephone Encounter (Signed)
PA request was received from (pharmacy): [facility] Phone: Fax:  Medication name and strength: Symbicort 174mg Ordering Provider: MH  Was PA started with [facility]?: Yes If yes, please enter KEY: [id number] tried and failed: Breo 200-253m, Advair [id number] Asmanex 100m86movered Alternatives:   PA sent to plan, time frame for approval / denial: 24-72hrs Routing to myself for follow-up.

## 2020-09-29 IMAGING — DX DG CHEST 2V
2 series · 2 of 2 positions shown · non-contrast
Comparison: 06/03/2015

CLINICAL DATA: Awoke from sleep with chest pain.

EXAM:
CHEST - 2 VIEW

[w chest pa]
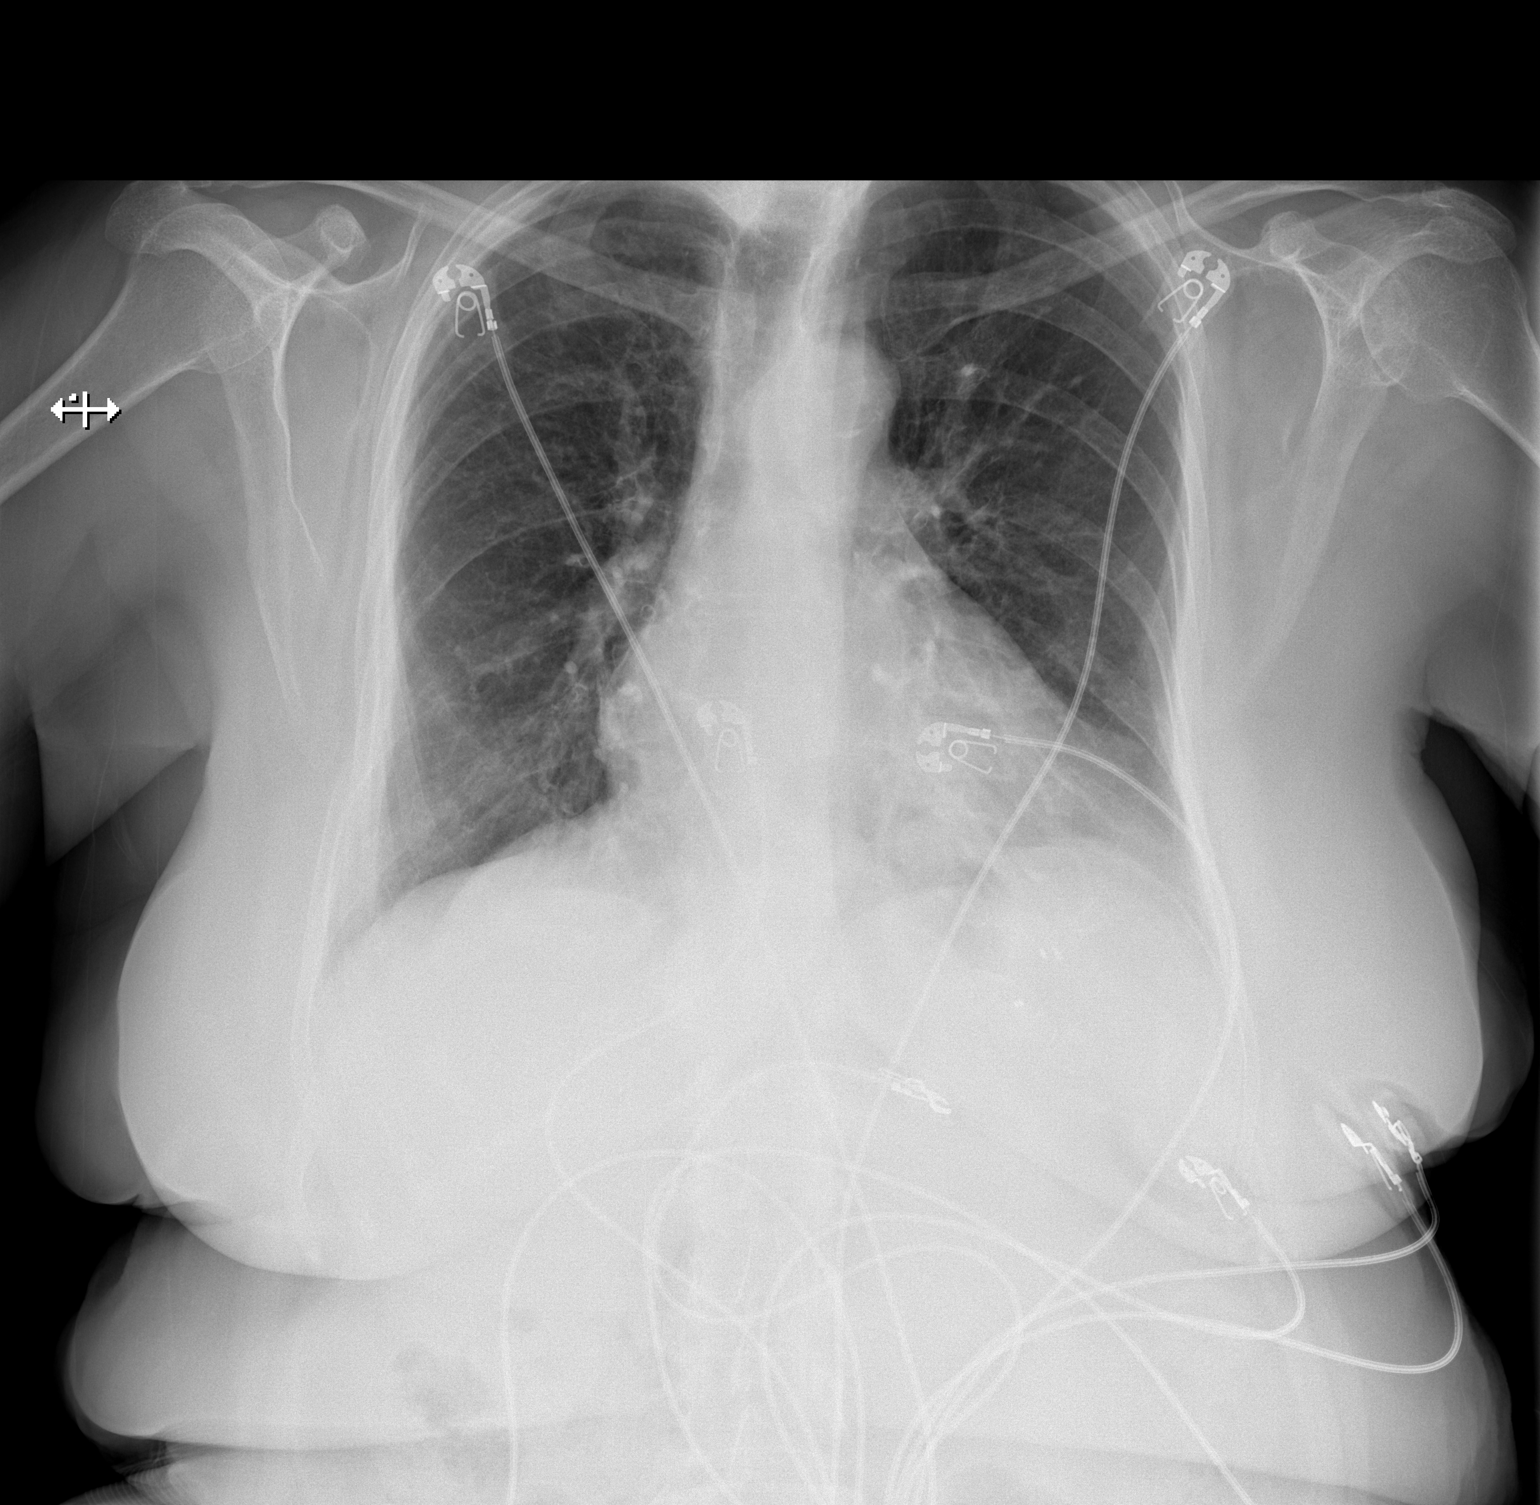

[w chest lat]
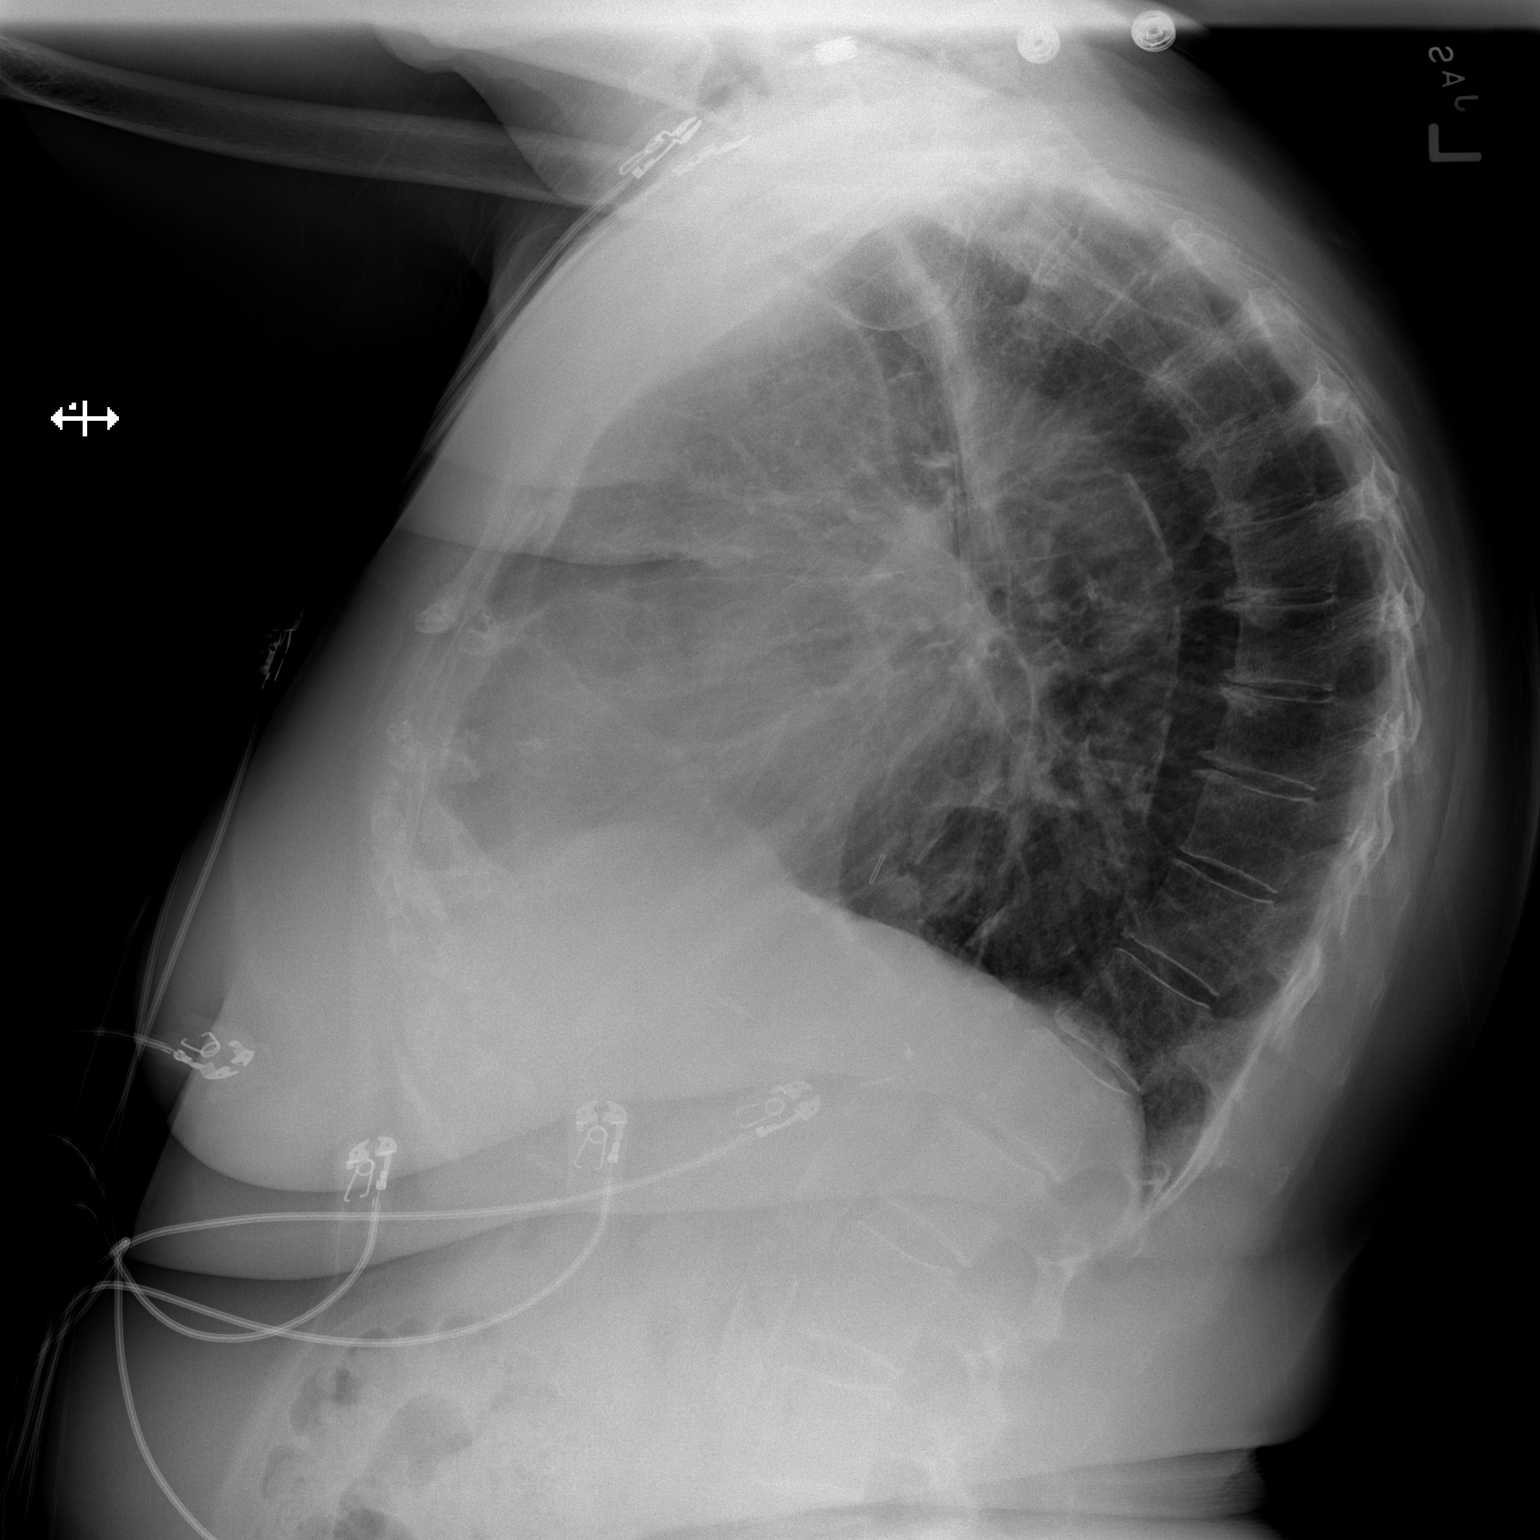

[2 of 2 positions shown; findings below may reference images not displayed]

FINDINGS: The cardiomediastinal contours are normal. Aortic atherosclerosis.
Increased AP diameter chest, unchanged. Chronic bronchitic change.
Pulmonary vasculature is normal. No consolidation, pleural effusion,
or pneumothorax. No acute osseous abnormalities are seen.
IMPRESSION: Chronic bronchitic change without acute abnormality.

Aortic Atherosclerosis ([id number]-VMB.B).

## 2020-10-01 DIAGNOSIS — E7800 Pure hypercholesterolemia, unspecified: Secondary | ICD-10-CM | POA: Diagnosis not present

## 2020-10-01 DIAGNOSIS — I10 Essential (primary) hypertension: Secondary | ICD-10-CM | POA: Diagnosis not present

## 2020-10-01 DIAGNOSIS — M810 Age-related osteoporosis without current pathological fracture: Secondary | ICD-10-CM | POA: Diagnosis not present

## 2020-10-01 DIAGNOSIS — Z789 Other specified health status: Secondary | ICD-10-CM | POA: Diagnosis not present

## 2020-10-01 DIAGNOSIS — I480 Paroxysmal atrial fibrillation: Secondary | ICD-10-CM | POA: Diagnosis not present

## 2020-10-01 DIAGNOSIS — K219 Gastro-esophageal reflux disease without esophagitis: Secondary | ICD-10-CM | POA: Diagnosis not present

## 2020-10-01 DIAGNOSIS — J45909 Unspecified asthma, uncomplicated: Secondary | ICD-10-CM | POA: Diagnosis not present

## 2020-10-01 DIAGNOSIS — Z23 Encounter for immunization: Secondary | ICD-10-CM | POA: Diagnosis not present

## 2020-10-02 NOTE — Progress Notes (Signed)
Labs 09/26/2020: BUN 18, creatinine 0.77, EGFR 71 mL, serum glucose 102 mg, potassium 4.8.  Total cholesterol 170, triglycerides 122, HDL 66, LDL 80.  Non-HDL cholesterol 114.  Hb 14.5/HCT 43.4, platelets 245, normal indicis.

## 2020-10-24 NOTE — Telephone Encounter (Signed)
PA was approved until 10/03/20.

## 2020-11-06 ENCOUNTER — Telehealth: Payer: Self-pay | Admitting: Pulmonary Disease

## 2020-11-06 NOTE — Telephone Encounter (Signed)
Raven from [facility] called in reference to patient's surgical clearance from Dr. Romana Juniper. States it was faxed on 09/11/2020 and they need an update.  Call back number is [phone] and fax is [phone].  Please advise.

## 2020-11-06 NOTE — Telephone Encounter (Signed)
Mandi, please advise if you have seen a surgical clearance on pt.

## 2020-11-06 NOTE — Telephone Encounter (Signed)
Call back number is [phone] and fax is [phone]

## 2020-11-11 NOTE — Telephone Encounter (Signed)
OV notes have been faxed to [phone] for clearance. Nothing further needed at this time.

## 2020-11-18 ENCOUNTER — Ambulatory Visit (INDEPENDENT_AMBULATORY_CARE_PROVIDER_SITE_OTHER): Payer: Medicare Other | Admitting: Pulmonary Disease

## 2020-11-18 ENCOUNTER — Encounter: Payer: Self-pay | Admitting: Pulmonary Disease

## 2020-11-18 ENCOUNTER — Telehealth: Payer: Self-pay

## 2020-11-18 ENCOUNTER — Other Ambulatory Visit: Payer: Self-pay

## 2020-11-18 VITALS — BP 118/78 | HR 59 | Temp 97.8°F | Ht 60.0 in | Wt 174.0 lb

## 2020-11-18 DIAGNOSIS — R0609 Other forms of dyspnea: Secondary | ICD-10-CM | POA: Diagnosis not present

## 2020-11-18 DIAGNOSIS — J4541 Moderate persistent asthma with (acute) exacerbation: Secondary | ICD-10-CM | POA: Diagnosis not present

## 2020-11-18 MED ORDER — PREDNISONE 20 MG PO TABS: 40.0000 mg | ORAL_TABLET | Freq: Every day | ORAL | 0 refills | Status: AC

## 2020-11-18 MED ORDER — BREZTRI AEROSPHERE 160-9-4.8 MCG/ACT IN AERO
2.0000 | INHALATION_SPRAY | Freq: Two times a day (BID) | RESPIRATORY_TRACT | 0 refills | Status: DC
Start: 2020-11-18 — End: 2020-11-24

## 2020-11-18 NOTE — Progress Notes (Signed)
@Patient  ID: Deborah Mcmillan, female    DOB: August 03, 1934, 86 y.o.   MRN: [id number]  Chief Complaint  Patient presents with   Follow-up    Patient is having a procedure and needs surgical clearance.     Referring provider: Deland Pretty, MD  HPI:   86 y.o. woman whom we are seeing in consultation for evaluation of dyspnea on exertion.    No real change in terms of chronic symptoms.  Reports good adherence to Symbicort.  Mouth sores a bit better but still come and go.  None now.  Listerine seem to help.  Symbicort not very helpful.  Unfortunately, about 1 week of upper respiratory tract viral infection.  Runny nose, cough, sore throat.  Mildly worse dyspnea and increased wheezing at home.  Green phlegm production.  Son, other family members with similar symptoms.  For unclear reasons, surgery office sent her back for preoperative evaluation.  I completed preoperative evaluation at last visit 09/25/2020.  Surgery office requested this note a couple weeks ago.  Office staff asked 11/11/2020 per telephone encounter.   HPI initial visit Patient has chronic dyspnea on exertion over the last couple years or so.  Maybe slight worsening over the last few months.  She thinks she had COVID prior to reliable testing in late 2019.  She again had in the fall 2020.  Since then her breathing has been worse, dyspnea on exertion.  Again had COVID early 2022.  Breathing worse with stairs or inclines.  Can be present over longer distance flat surface.  Relieved by rest.  Using Asmanex for her asthma.  This is not made much change in her dyspnea.  No timing during the day where things are better or worse.  No environmental factors to account for the change.  She does note seasonal allergies and may be breathing a bit worse when pollen in the spring.  Has been more congested over the last few weeks with pollen season.  No other alleviating or exacerbating factors.  Dyspnea described as moderate.  She does note she is  much less active over the last year or 2 compared to prepandemic.  More times indoors.  Also notes mild 10 to 15 pound weight gain.  Chest x-ray most recent chest imaging in 2019 reviewed interpreted as clear lungs, no effusion, infiltrate, pneumothorax.  PMH: Asthma, hypertension, GERD Surgical history: Appendectomy, shoulder surgery, bladder surgery, Family history: Mother brother and sister with various forms of cancer, no pulmonary disease in first-degree relatives Social history: Never smoker, retired, lives in [place] / Pulmonary Flowsheets:   ACT:  Asthma Control Test ACT Total Score  11/18/2020 19    MMRC: No flowsheet data found.  Epworth:  No flowsheet data found.  Tests:   FENO:  No results found for: NITRICOXIDE  PFT: PFT Results Latest Ref Rng & Units 09/24/2020  FVC-Pre L 1.68  FVC-Predicted Pre % 85  FVC-Post L 1.71  FVC-Predicted Post % 87  Pre FEV1/FVC % % 76  Post FEV1/FCV % % 80  FEV1-Pre L 1.28  FEV1-Predicted Pre % 89  FEV1-Post L 1.38  DLCO uncorrected ml/min/mmHg 14.17  DLCO UNC% % 87  DLCO corrected ml/min/mmHg 13.31  DLCO COR %Predicted % 82  DLVA Predicted % 95  Personally reviewed and interpreted as normal spirometry.  No bronchodilator response.  DLCO within normal limits.  WALK:  No flowsheet data found.  Imaging: Personally reviewed and as per EMR discussion this note  Lab Results: Personally reviewed, eosinophils 200 8/22 CBC    Component Value Date/Time   WBC 10.3 08/15/2020 1104   RBC 5.11 08/15/2020 1104   HGB 15.7 (H) 08/15/2020 1104   HCT 46.7 (H) 08/15/2020 1104   PLT 241 08/15/2020 1104   MCV 91.4 08/15/2020 1104   MCH 30.7 08/15/2020 1104   MCHC 33.6 08/15/2020 1104   RDW 12.4 08/15/2020 1104   LYMPHSABS 3.1 08/15/2020 1104   MONOABS 0.8 08/15/2020 1104   EOSABS 0.2 08/15/2020 1104   BASOSABS 0.1 08/15/2020 1104    BMET    Component Value Date/Time   NA 138 08/15/2020 1104   K 4.5  08/15/2020 1104   CL 102 08/15/2020 1104   CO2 25 08/15/2020 1104   GLUCOSE 110 (H) 08/15/2020 1104   BUN 19 08/15/2020 1104   CREATININE 0.91 08/15/2020 1104   CALCIUM 9.7 08/15/2020 1104   GFRNONAA >60 08/15/2020 1104   GFRAA >60 12/25/2017 0600    BNP No results found for: BNP  ProBNP No results found for: PROBNP  Specialty Problems   None  Allergies  Allergen Reactions   Calcium-Containing Compounds Other (See Comments)    Muscle cramping   Magnesium-Containing Compounds Itching and Rash   Tape Itching and Rash    Plastic, and "brown tape"   Cefuroxime Axetil Other (See Comments)    Other reaction(s): leg pain   Codeine Nausea Only    Pain & upset stomach   Crestor [Rosuvastatin] Other (See Comments)    Statin intolerance   Lodine [Etodolac] Other (See Comments)    Upset stomach   Niacin And Related Rash    "on fire"   Penicillins Rash    She reports some swelling and itchy.    Reclast [Zoledronic Acid] Other (See Comments)    Caused severe jaw pain   Sulfa Antibiotics Other (See Comments)    Pain & upset stomach   Tetracyclines & Related Rash    Upset stomach   Welchol [Colesevelam] Nausea Only and Other (See Comments)    Stomach did not feel good   Zetia [Ezetimibe] Other (See Comments)    Immunization History  Administered Date(s) Administered   DTP 05/13/2007   Fluad Quad(high Dose 65+) 09/22/2018   Influenza Split 10/14/2012   Influenza, High Dose Seasonal PF 10/14/2012, 09/04/2014, 11/04/2015   Influenza, Quadrivalent, Recombinant, Inj, Pf 10/19/2017, 09/22/2018, 10/01/2020   Influenza-Unspecified 09/27/2013, 10/11/2017   PFIZER(Purple Top)SARS-COV-2 Vaccination 03/29/2019, 04/23/2019, 01/01/2020   Pneumococcal Polysaccharide-23 02/28/2004   Td 10/11/2007   Tdap 05/13/2011   Zoster, Unspecified 07/10/2010    Past Medical History:  Diagnosis Date   Dyslipidemia    Dysrhythmia    h/o atrial couplets with short PAT, h/o a-fib during surgery  (event monitor)   GERD (gastroesophageal reflux disease)    History of nuclear stress test 05/2009   dipyridamole; normal, low risk    Hypertension    Osteoporosis    Postoperative atrial fibrillation ([place])  2011   Seasonal allergies     Tobacco History: Social History   Tobacco Use  Smoking Status Never  Smokeless Tobacco Never   Counseling given: Not Answered   Continue to not smoke  Outpatient Encounter Medications as of 11/18/2020  Medication Sig   albuterol (VENTOLIN HFA) 108 (90 Base) MCG/ACT inhaler Inhale 1 puff into the lungs every 4 (four) hours as needed for wheezing or shortness of breath.   budesonide-formoterol (SYMBICORT) 160-4.5 MCG/ACT inhaler Inhale 2 puffs into the lungs 2 (two)  times daily.   denosumab (PROLIA) 60 MG/ML SOSY injection Inject 60 mg into the skin every 6 (six) months.   diltiazem (CARDIZEM CD) 120 MG 24 hr capsule Take 1 capsule (120 mg total) by mouth daily.   Magnesium 400 MG CAPS Take 400-800 mg by mouth daily.   metoprolol succinate (TOPROL-XL) 100 MG 24 hr tablet TAKE 1 TABLET(100 MG) BY MOUTH EVERY EVENING   omeprazole (PRILOSEC) 20 MG capsule Take 20 mg by mouth daily.   OVER THE COUNTER MEDICATION Apple Cider Vingar   PRALUENT 75 MG/ML SOAJ INJECT 75 MG UNDER THE SKIN EVERY 14 DAYS   predniSONE (DELTASONE) 20 MG tablet Take 2 tablets (40 mg total) by mouth daily with breakfast for 5 days.   rivaroxaban (XARELTO) 20 MG TABS tablet Take 1 tablet (20 mg total) by mouth daily with supper.   spironolactone (ALDACTONE) 50 MG tablet TAKE 1 TABLET BY MOUTH EVERY MORNING   [DISCONTINUED] Cholecalciferol (VITAMIN D) 2000 UNITS CAPS Take 1 capsule by mouth daily. (Patient not taking: Reported on 11/18/2020)   No facility-administered encounter medications on file as of 11/18/2020.     Review of Systems  Review of Systems  N/A Physical Exam  BP 118/78 (BP Location: Left Arm, Patient Position: Sitting, Cuff Size: Large)   Pulse (!) 59   Temp  97.8 F (36.6 C) (Oral)   Ht 5' (1.524 m)   Wt 174 lb (78.9 kg)   SpO2 97%   BMI 33.98 kg/m   Wt Readings from Last 5 Encounters:  11/18/20 174 lb (78.9 kg)  09/25/20 172 lb 12.8 oz (78.4 kg)  08/18/20 174 lb 9.6 oz (79.2 kg)  08/15/20 174 lb (78.9 kg)  05/09/20 174 lb (78.9 kg)    BMI Readings from Last 5 Encounters:  11/18/20 33.98 kg/m  09/25/20 33.75 kg/m  08/18/20 34.10 kg/m  08/15/20 33.98 kg/m  05/09/20 33.98 kg/m     Physical Exam General: Well-appearing, no acute distress Eyes: EOMI, icterus Neck: Supple no JVP Cardiovascular: Regular rate and rhythm, no murmurs Pulmonary: Slight expiratory wheeze on the right with forced exhalation, otherwise clear, normal work of breathing on room air Neuro: Normal gait, no weakness Psych: Normal mood, full affect   Assessment & Plan:   DOE: Likely multifactorial in the setting of known prior asthma, mild weight gain, deconditioning with decreased activity.  Chest x-ray clear.  PFTs normal.  Asthma:  Well-controlled over time.  However, fear that COVID-19 infection 2022 triggered worsening asthma symptoms.  Avoiding Breo given allergy to magnesium stearate.  Advair caused mouth sores.  High-dose Symbicort without significant improvement.  Trial of Breztri via sample, consider sending in prescription if improved on this therapy.  Eosinophils 200 8/22.  Check IgE next visit if not well controlled, does have history of atopic symptoms, histaminergic sounding rashes.  Unfortunate, with recent upper respiratory viral infection, slight wheeze on exam with worsening cough and shortness of breath.  Prednisone 40 mg daily x5 days prescribed today.  Preoperative evaluation: Pulmonary medicine does not provide clearance but rather preoperative risk assessment. Based on ARISCAT risk model she is now high or 42.1% of postop pulmonary complication. Her risk has increased from intermediate or 13.3 percent of postop pulmonary complication  given presence of upper respiratory tract infection over the last week.  Otherwise, there are no modifiable risk factors, the location of incision as well as age dictate entirety of her surgical risk.  Recommend delaying surgery for 1 month to allow for healing  from her upper respiratory viral infection.  Please see below for recommendations in the perioperative period. -- Please administer DuoNebs prior to surgery --Consider checking arterial blood gas during case, if retaining CO2 recommend extubation to BiPAP -- If CO2 retention noted intraoperatively, recommend checking postoperative arterial blood gas --Please administer DuoNebs postoperatively --If admitted to the hospital postoperatively, please order budesonide nebulized twice a day and arformoterol nebulized twice a day in addition to as needed DuoNebs    Return in about 3 months (around 02/18/2021).   Lanier Clam, MD 11/18/2020

## 2020-11-18 NOTE — Addendum Note (Signed)
Addended by: Dessie Coma on: 11/18/2020 03:28 PM   Modules accepted: Orders

## 2020-11-18 NOTE — Telephone Encounter (Signed)
-----   Message from Lanier Clam, MD sent at 11/18/2020  2:05 PM EST ----- Can you check with [facility] about clearance? Patient was sent here by them despite [place] faxing over recs a week ago. New and updated recs in today's note.

## 2020-11-18 NOTE — Patient Instructions (Signed)
Nice to see you again  I am sorry you were not feeling well  Sounds like a viral illness is triggered worsening asthma symptoms  Take prednisone 40 mg a day for the next 5 days  Tried his new inhaler, Breztri, 2 puffs twice a day.  Similar to Symbicort but adds a third medicine to help open up the airways.  If this seems really helpful let me know and I can send in a new prescription.  Otherwise, resume Symbicort once the sample runs out.  The sample will last for 7 days.  Rinse your mouth out after every use.  I will have our staff contact the surgeons office to see if there is anything I can do in terms of the preoperative evaluation.  May be wise to delay for a few weeks since we are treating you for an acute asthma exacerbation today.  Return to clinic in 3 months for routine follow-up, sooner as needed

## 2020-11-18 NOTE — Telephone Encounter (Signed)
[facility] and was told they can see the risk assessment that was sent over. Advised them to let us know if they need anything else because the patient was seen today even though I had already sent everything over. Nothing further needed at this time

## 2020-11-24 ENCOUNTER — Telehealth: Payer: Self-pay | Admitting: Pulmonary Disease

## 2020-11-24 MED ORDER — BREZTRI AEROSPHERE 160-9-4.8 MCG/ACT IN AERO: 2.0000 | INHALATION_SPRAY | Freq: Two times a day (BID) | RESPIRATORY_TRACT | 1 refills | Status: DC

## 2020-11-24 NOTE — Telephone Encounter (Signed)
I called the patient and let her know that I have sent in a prescription and she has already checked with insurance and her co pay is going to be low and she is agreeable with the cost. Nothing further needed.

## 2020-12-02 DIAGNOSIS — J45909 Unspecified asthma, uncomplicated: Secondary | ICD-10-CM | POA: Diagnosis not present

## 2020-12-02 DIAGNOSIS — J0101 Acute recurrent maxillary sinusitis: Secondary | ICD-10-CM | POA: Diagnosis not present

## 2020-12-11 ENCOUNTER — Other Ambulatory Visit: Payer: Self-pay | Admitting: Cardiology

## 2020-12-17 ENCOUNTER — Ambulatory Visit: Payer: BLUE CROSS/BLUE SHIELD | Admitting: Cardiology

## 2020-12-17 NOTE — Patient Instructions (Addendum)
DUE TO COVID-19 ONLY ONE VISITOR IS ALLOWED TO COME WITH YOU AND STAY IN THE WAITING ROOM ONLY DURING PRE OP AND PROCEDURE.   **NO VISITORS ARE ALLOWED IN THE SHORT STAY AREA OR RECOVERY ROOM!!**       Your procedure is scheduled on: 01/01/21   Report to [facility] Main Entrance    Report to admitting at 10:45 AM   Call this number if you have problems the morning of surgery [phone]   Do not eat food :After Midnight.   May have liquids until 10:00 AM day of surgery  CLEAR LIQUID DIET  Foods Allowed                                                                     Foods Excluded  Water, Black Coffee and tea (no milk or creamer)           liquids that you cannot  Plain Jell-O in any flavor  (No red)                                    see through such as: Fruit ices (not with fruit pulp)                                            milk, soups, orange juice              Iced Popsicles (No red)                                               All solid food                                   Apple juices Sports drinks like Gatorade (No red) Lightly seasoned clear broth or consume(fat free) Sugar    Oral Hygiene is also important to reduce your risk of infection.                                    Remember - BRUSH YOUR TEETH THE MORNING OF SURGERY WITH YOUR REGULAR TOOTHPASTE   Take these medicines the morning of surgery with A SIP OF WATER: Inhalers, Metoprolol, Omeprazole                              You may not have any metal on your body including hair pins, jewelry, and body piercing             Do not wear make-up, lotions, powders, perfumes, or deodorant  Do not wear nail polish including gel and S&S, artificial/acrylic nails, or any other type of covering on natural nails including finger and toenails. If you have artificial nails, gel coating, etc. that needs to  be removed by a nail salon please have this removed prior to surgery or surgery may need to be  canceled/ delayed if the surgeon/ anesthesia feels like they are unable to be safely monitored.   Do not shave  48 hours prior to surgery.    Do not bring valuables to the hospital. [place].   Contacts, dentures or bridgework may not be worn into surgery.    Patients discharged on the day of surgery will not be allowed to drive home.              Please read over the following fact sheets you were given: IF YOU HAVE QUESTIONS ABOUT YOUR PRE-OP INSTRUCTIONS PLEASE CALL [facility] - Preparing for Surgery Before surgery, you can play an important role.  Because skin is not sterile, your skin needs to be as free of germs as possible.  You can reduce the number of germs on your skin by washing with CHG (chlorahexidine gluconate) soap before surgery.  CHG is an antiseptic cleaner which kills germs and bonds with the skin to continue killing germs even after washing. Please DO NOT use if you have an allergy to CHG or antibacterial soaps.  If your skin becomes reddened/irritated stop using the CHG and inform your nurse when you arrive at Short Stay. Do not shave (including legs and underarms) for at least 48 hours prior to the first CHG shower.  You may shave your face/neck.  Please follow these instructions carefully:  1.  Shower with CHG Soap the night before surgery and the  morning of surgery.  2.  If you choose to wash your hair, wash your hair first as usual with your normal  shampoo.  3.  After you shampoo, rinse your hair and body thoroughly to remove the shampoo.                             4.  Use CHG as you would any other liquid soap.  You can apply chg directly to the skin and wash.  Gently with a scrungie or clean washcloth.  5.  Apply the CHG Soap to your body ONLY FROM THE NECK DOWN.   Do   not use on face/ open                           Wound or open sores. Avoid contact with eyes, ears mouth and   genitals  (private parts).                       Wash face,  Genitals (private parts) with your normal soap.             6.  Wash thoroughly, paying special attention to the area where your    surgery  will be performed.  7.  Thoroughly rinse your body with warm water from the neck down.  8.  DO NOT shower/wash with your normal soap after using and rinsing off the CHG Soap.                9.  Pat yourself dry with a clean towel.            10.  Wear clean pajamas.  11.  Place clean sheets on your bed the night of your first shower and do not  sleep with pets. Day of Surgery : Do not apply any lotions/deodorants the morning of surgery.  Please wear clean clothes to the hospital/surgery center.  FAILURE TO FOLLOW THESE INSTRUCTIONS MAY RESULT IN THE CANCELLATION OF YOUR SURGERY  PATIENT SIGNATURE_________________________________  NURSE SIGNATURE__________________________________  ________________________________________________________________________

## 2020-12-17 NOTE — Progress Notes (Addendum)
COVID swab appointment: n/a  COVID Vaccine Completed: yes x3 Date COVID Vaccine completed: 03/29/19, 04/23/19 Has received booster: 01/01/20  COVID vaccine manufacturer: [place]   Date of COVID positive in last 90 days: no  PCP - Deland Pretty, MD Cardiologist Einar Gip, MD pt seeing 12/14  Pulmonology Clearance 11/18/20 by Larey Days in [place]  Chest x-ray - 08/15/20 Epic EKG - 08/18/20 Epic Stress Test - 2011 Epic ECHO - 04/03/20 Epic Cardiac Cath - n/a Pacemaker/ICD device last checked:n/a Spinal Cord Stimulator: n/a  Sleep Study - n/a CPAP -   Fasting Blood Sugar - n/a Checks Blood Sugar _____ times a day  Blood Thinner Instructions: Xarelto, no instructions. Patient seeing Dr. Einar Gip 12/24/20 Aspirin Instructions: Last Dose:  Activity level: Can go up a flight of stairs and perform activities of daily living without stopping and without symptoms of chest pain. SOB with stairs or activity, not new     Anesthesia review: A fib, HTN, SOB, palpitations  Patient denies shortness of breath, fever, cough and chest pain at PAT appointment   Patient verbalized understanding of instructions that were given to them at the PAT appointment. Patient was also instructed that they will need to review over the PAT instructions again at home before surgery.

## 2020-12-18 ENCOUNTER — Encounter (HOSPITAL_COMMUNITY)
Admission: RE | Admit: 2020-12-18 | Discharge: 2020-12-18 | Disposition: A | Discharge status: Home / Self Care | Payer: Medicare Other | Source: Ambulatory Visit | Attending: Surgery | Admitting: Surgery

## 2020-12-18 ENCOUNTER — Encounter (HOSPITAL_COMMUNITY): Payer: Self-pay

## 2020-12-18 ENCOUNTER — Other Ambulatory Visit: Payer: Self-pay

## 2020-12-18 VITALS — BP 159/57 | HR 63 | Temp 98.7°F | Resp 18 | Ht 60.0 in | Wt 170.2 lb

## 2020-12-18 DIAGNOSIS — Z7901 Long term (current) use of anticoagulants: Secondary | ICD-10-CM | POA: Insufficient documentation

## 2020-12-18 DIAGNOSIS — Z01812 Encounter for preprocedural laboratory examination: Secondary | ICD-10-CM | POA: Diagnosis not present

## 2020-12-18 DIAGNOSIS — I10 Essential (primary) hypertension: Secondary | ICD-10-CM | POA: Insufficient documentation

## 2020-12-18 HISTORY — DX: Unspecified asthma, uncomplicated: J45.909

## 2020-12-18 HISTORY — DX: Pneumonia, unspecified organism: J18.9

## 2020-12-18 HISTORY — DX: Unspecified osteoarthritis, unspecified site: M19.90

## 2020-12-18 HISTORY — DX: Nausea with vomiting, unspecified: R11.2

## 2020-12-18 HISTORY — DX: Other specified postprocedural states: Z98.890

## 2020-12-18 HISTORY — DX: Other complications of anesthesia, initial encounter: T88.59XA

## 2020-12-18 LAB — CBC
HCT: 42.8 % (ref 36.0–46.0)
Hemoglobin: 14.5 g/dL (ref 12.0–15.0)
MCH: 31.5 pg (ref 26.0–34.0)
MCHC: 33.9 g/dL (ref 30.0–36.0)
MCV: 93 fL (ref 80.0–100.0)
Platelets: 270 10*3/uL (ref 150–400)
RBC: 4.6 MIL/uL (ref 3.87–5.11)
RDW: 12.7 % (ref 11.5–15.5)
WBC: 12 10*3/uL — ABNORMAL HIGH (ref 4.0–10.5)
nRBC: 0 % (ref 0.0–0.2)

## 2020-12-18 LAB — BASIC METABOLIC PANEL
Anion gap: 10 (ref 5–15)
BUN: 28 mg/dL — ABNORMAL HIGH (ref 8–23)
CO2: 24 mmol/L (ref 22–32)
Calcium: 9.7 mg/dL (ref 8.9–10.3)
Chloride: 104 mmol/L (ref 98–111)
Creatinine, Ser: 0.91 mg/dL (ref 0.44–1.00)
GFR, Estimated: >60 mL/min (ref >60)
Glucose, Bld: 96 mg/dL (ref 70–99)
Potassium: 4.2 mmol/L (ref 3.5–5.1)
Sodium: 138 mmol/L (ref 135–145)

## 2020-12-24 ENCOUNTER — Ambulatory Visit: Payer: Medicare Other | Admitting: Pulmonary Disease

## 2020-12-24 ENCOUNTER — Ambulatory Visit: Payer: Medicare Other | Admitting: Cardiology

## 2020-12-24 ENCOUNTER — Other Ambulatory Visit: Payer: Self-pay

## 2020-12-25 ENCOUNTER — Encounter: Payer: Self-pay | Admitting: Cardiology

## 2020-12-25 ENCOUNTER — Ambulatory Visit: Payer: Medicare Other | Admitting: Cardiology

## 2020-12-25 VITALS — BP 130/52 | HR 71 | Temp 97.2°F | Resp 17 | Ht 60.0 in | Wt 176.4 lb

## 2020-12-25 DIAGNOSIS — I10 Essential (primary) hypertension: Secondary | ICD-10-CM

## 2020-12-25 DIAGNOSIS — I480 Paroxysmal atrial fibrillation: Secondary | ICD-10-CM | POA: Diagnosis not present

## 2020-12-25 DIAGNOSIS — Z01810 Encounter for preprocedural cardiovascular examination: Secondary | ICD-10-CM | POA: Diagnosis not present

## 2020-12-25 DIAGNOSIS — I6523 Occlusion and stenosis of bilateral carotid arteries: Secondary | ICD-10-CM | POA: Diagnosis not present

## 2020-12-25 NOTE — Progress Notes (Signed)
Primary Physician/Referring:  Deland Pretty, MD  Patient ID: Deborah Mcmillan, female    DOB: September 22, 1934, 86 y.o.   MRN: [id number]  Chief Complaint  Patient presents with   Follow-up   Paroxysmal atrial fibrillation (HCC)   Tachycardia   HPI:    Deborah Mcmillan  is a 86 y.o.  Caucasian female with carotid atheresclerosis and hypertension and hyperlipidemia, LDL in the range of 170-190, however she has not been able to tolerate any statins.  She is now on Praluent. She has bronchial asthma and multiple medication allergies and intolerances.  H/O A.. Fib post op  05/06/2009 after minor surgery to skin.   She had been doing well until 08/15/2020 presented to the emergency room with shortness of breath "difficulty in getting air in", fatigue and palpitations when she woke up from her sleep, found to be in A. fib with RVR.  However patient spontaneously converted to sinus rhythm.  She has been on Eliquis since then, patient prefers to continue anticoagulation.  She made an appointment to see me today as on 12/14/2020 she felt dizzy, not well and palpitations and she is wearing a smart watch that suggested probable A. fib. She is tolerating the medications well.  She is also scheduled for cholecystectomy on 01/01/2021.  Past Medical History:  Diagnosis Date   Arthritis    Asthma    Complication of anesthesia    difficult to wake up and agitation   Dyslipidemia    Dysrhythmia    h/o atrial couplets with short PAT, h/o a-fib during surgery (event monitor)   GERD (gastroesophageal reflux disease)    History of nuclear stress test 05/11/2009   dipyridamole; normal, low risk    Hypertension    Osteoporosis    Pneumonia    PONV (postoperative nausea and vomiting)    Postoperative atrial fibrillation ([place]) 01/11/2009   Seasonal allergies    Past Surgical History:  Procedure Laterality Date   APPENDECTOMY  1961   BLADDER SURGERY  2002   BREAST BIOPSY Left 05/09/2012   calcified and  hyalinized fibroadenomatous and sclerosing adenosis   COSMETIC SURGERY  2011   1 episode of atrial fibrillation during this    [place]   hiatal   lump removal  05/06/2009   [facility]   NM MYOVIEW LTD  May 2011   No ischemia or infarction.   SHOULDER SURGERY Left Feb 2015   Dr. Veverly Fells - arthroscopy (rotator cuff ?debridement plus biceps tenodesis)   TRANSTHORACIC ECHOCARDIOGRAM  05/2009   EF=>55%, with normal LV systolic function, impaired LV relaxation; trace MR/TR; mild AV regurg   Family History  Problem Relation Age of Onset   Cancer Father    Cancer - Other Father    Cancer Brother    Cancer - Other Brother    Stroke Mother    Cancer - Other Sister    Cancer - Other Sister    Breast cancer Neg Hx     Social History   Tobacco Use   Smoking status: Never   Smokeless tobacco: Never  Substance Use Topics   Alcohol use: No   ROS  Review of Systems  Cardiovascular:  Positive for palpitations (occasional). Negative for chest pain, dyspnea on exertion and leg swelling.  Gastrointestinal:  Positive for abdominal pain (scheduled for cholecystectomy). Negative for melena.  Objective  Blood pressure (!) 138/52, pulse 71, temperature (!) 97.2 F (36.2 C), temperature source Temporal, resp. rate 17, height 5' (1.524  m), weight 176 lb 6.4 oz (80 kg), SpO2 93 %.  Vitals with BMI 12/25/2020 12/18/2020 11/18/2020  Height $Remov'5\' 0"'UTrNHl$  $Remove'5\' 0"'UYYbqAZ$  $RemoveB'5\' 0"'QpbNyupp$   Weight 176 lbs 6 oz 170 lbs 3 oz 174 lbs  BMI 34.45 23.55 73.22  Systolic 025 427 062  Diastolic 52 57 78  Pulse 71 63 59     Physical Exam Neck:     Vascular: No carotid bruit or JVD.  Cardiovascular:     Rate and Rhythm: Normal rate and regular rhythm.     Pulses: Intact distal pulses.     Heart sounds: Normal heart sounds. No murmur heard.   No gallop.  Pulmonary:     Effort: Pulmonary effort is normal.     Breath sounds: Normal breath sounds.  Abdominal:     General: Bowel sounds are normal.     Palpations: Abdomen is soft.   Musculoskeletal:        General: No swelling.   Laboratory examination:   Recent Labs    08/15/20 1104 12/18/20 1332  NA 138 138  K 4.5 4.2  CL 102 104  CO2 25 24  GLUCOSE 110* 96  BUN 19 28*  CREATININE 0.91 0.91  CALCIUM 9.7 9.7  GFRNONAA >60 >60    estimated creatinine clearance is 43.1 mL/min (by C-G formula based on SCr of 0.91 mg/dL).  CMP Latest Ref Rng & Units 12/18/2020 08/15/2020 12/25/2017  Glucose 70 - 99 mg/dL 96 110(H) 141(H)  BUN 8 - 23 mg/dL 28(H) 19 23  Creatinine 0.44 - 1.00 mg/dL 0.91 0.91 0.86  Sodium 135 - 145 mmol/L 138 138 138  Potassium 3.5 - 5.1 mmol/L 4.2 4.5 3.6  Chloride 98 - 111 mmol/L 104 102 101  CO2 22 - 32 mmol/L 24 25 20(L)  Calcium 8.9 - 10.3 mg/dL 9.7 9.7 9.4   CBC Latest Ref Rng & Units 12/18/2020 08/15/2020 12/25/2017  WBC 4.0 - 10.5 K/uL 12.0(H) 10.3 8.5  Hemoglobin 12.0 - 15.0 g/dL 14.5 15.7(H) 13.9  Hematocrit 36.0 - 46.0 % 42.8 46.7(H) 44.3  Platelets 150 - 400 K/uL 270 241 232    External labs:    Labs 09/26/2020: BUN 18, creatinine 0.77, EGFR 71 mL, serum glucose 102 mg, potassium 4.8.  Total cholesterol 170, triglycerides 122, HDL 66, LDL 80.  Non-HDL cholesterol 114.  Hb 14.5/HCT 43.4, platelets 245, normal indicis.  Labs 02/12/2019:  Hb 14.6/HCT 43.1, platelets 225, normal indicis.  Sodium 142, potassium 4.0, BUN 24, creatinine 0.9, EGFR 63 mL. CMP normal.  Total cholesterol 155, triglycerides 133, HDL 64, LDL 64.  Medications and allergies   Allergies  Allergen Reactions   Calcium-Containing Compounds Other (See Comments)    Muscle cramping   Magnesium-Containing Compounds Itching and Rash   Tape Itching and Rash    Plastic, and "brown tape"   Cefuroxime Axetil Other (See Comments)    leg pain   Codeine Nausea Only    Pain & upset stomach   Crestor [Rosuvastatin] Other (See Comments)    Statin intolerance   Lodine [Etodolac] Other (See Comments)    Upset stomach   Niacin And Related Rash    "on fire"    Penicillins Itching, Swelling and Rash    She reports some swelling and itchy.    Reclast [Zoledronic Acid] Other (See Comments)    Caused severe jaw pain   Sulfa Antibiotics Other (See Comments)    Pain & upset stomach   Tetracyclines & Related Rash  Upset stomach   Welchol [Colesevelam] Nausea Only and Other (See Comments)    Stomach did not feel good   Zetia [Ezetimibe] Other (See Comments)    Muscle pain     Current Outpatient Medications on File Prior to Visit  Medication Sig Dispense Refill   albuterol (VENTOLIN HFA) 108 (90 Base) MCG/ACT inhaler Inhale 1 puff into the lungs every 4 (four) hours as needed for wheezing or shortness of breath. 1 each 11   Budeson-Glycopyrrol-Formoterol (BREZTRI AEROSPHERE) 160-9-4.8 MCG/ACT AERO Inhale 2 puffs into the lungs in the morning and at bedtime. 32.1 g 1   Cholecalciferol (VITAMIN D) 50 MCG (2000 UT) CAPS Take 1 capsule by mouth daily.     denosumab (PROLIA) 60 MG/ML SOSY injection Inject 60 mg into the skin every 6 (six) months.     diltiazem (CARDIZEM CD) 120 MG 24 hr capsule Take 1 capsule (120 mg total) by mouth daily. (Patient taking differently: Take 120 mg by mouth daily with supper.) 90 capsule 3   fluticasone (FLONASE) 50 MCG/ACT nasal spray Place 1 spray into both nostrils daily.     MAGNESIUM CITRATE PO Take 250 mg by mouth in the morning.     magnesium oxide (MAG-OX) 400 MG tablet Take 400 mg by mouth daily.     metoprolol succinate (TOPROL-XL) 100 MG 24 hr tablet TAKE 1 TABLET(100 MG) BY MOUTH EVERY EVENING 90 tablet 0   omeprazole (PRILOSEC) 20 MG capsule Take 20 mg by mouth in the morning.     PRALUENT 75 MG/ML SOAJ INJECT 75 MG UNDER THE SKIN EVERY 14 DAYS 6 mL 3   rivaroxaban (XARELTO) 20 MG TABS tablet Take 1 tablet (20 mg total) by mouth daily with supper. 90 tablet 3   spironolactone (ALDACTONE) 50 MG tablet TAKE 1 TABLET BY MOUTH EVERY MORNING 90 tablet 3   APPLE CIDER VINEGAR PO Take 1-2 tablets by mouth daily.  (Patient not taking: Reported on 12/25/2020)     No current facility-administered medications on file prior to visit.    Radiology:   No results found.  Cardiac Studies:   Carotid artery duplex 10/03/15: No hemodynamically significant arterial disease in the internal carotid artery bilaterally. Minimal mixed plaque noted. Antegrade vertebral artery flow.  Nuclear stress test   [12/11/2015]:  1. The resting electrocardiogram demonstrated normal sinus rhythm, normal resting conduction, no resting arrhythmias and normal rest repolarization. Stress EKG is non-diagnostic for ischemia as it a pharmacologic stress using Lexiscan. Stress symptoms included dyspnea. 2. Myocardial perfusion imaging is normal. Overall left ventricular systolic function was normal without regional wall motion abnormalities. The left ventricular ejection fraction was 58%.  PCV ECHOCARDIOGRAM COMPLETE 53/74/8270 Normal LV systolic function with visual EF 60-65%. Left ventricle cavity is normal in size. Normal global wall motion. Indeterminate diastolic filling pattern, normal LAP. Left atrial cavity is mildly dilated. Mild (Grade I) aortic regurgitation. Mild (Grade I) mitral regurgitation. Mild tricuspid regurgitation. No evidence of pulmonary hypertension. RVSP measures 30 mmHg. Mild pulmonic regurgitation. Prior study dated 12/09/2015: LVEF 78%, normal diastolic function, mild LAE, mild to moderate AR, Mild to moderate MR, Mild TR, no PHTN.     EKG  EKG 12/25/2020: Normal sinus rhythm at rate of 62 bpm, normal axis.  No evidence of ischemia, normal EKG. no significant change from 08/18/2020.   Assessment     ICD-10-CM   1. Paroxysmal atrial fibrillation (HCC)  I48.0 EKG 12-Lead      CHA2DS2-VASc Score is 4.  Yearly risk of  stroke: 4.8% (A, HTN, Female).  Score of 1=0.6; 2=2.2; 3=3.2; 4=4.8; 5=7.2; 6=9.8; 7=>9.8) -(CHF; HTN; vasc disease DM,  Female = 1; Age <65 =0; 65-74 = 1,  >75 =2; stroke/embolism= 2).     No orders of the defined types were placed in this encounter.   Medications Discontinued During This Encounter  Medication Reason   budesonide-formoterol (SYMBICORT) 160-4.5 MCG/ACT inhaler    Orders Placed This Encounter  Procedures   EKG 12-Lead      Recommendations:   Deborah Mcmillan  is a 86 y.o. Caucasian female with carotid atheresclerosis and hypertension and hyperlipidemia, LDL in the range of 170-190, however she has not been able to tolerate any statins.  She is now on Praluent. She has bronchial asthma and multiple medication allergies and intolerances.  H/O A.. Fib post op  05/06/2009 after minor surgery to skin.   She had been doing well until 08/15/2020 presented to the emergency room with shortness of breath "difficulty in getting air in", fatigue and palpitations when she woke up from her sleep, found to be in A. fib with RVR.  However patient spontaneously converted to sinus rhythm.  She has been on Eliquis since then, patient prefers to continue anticoagulation.  She made an appointment to see me today as on 12/14/2020 she felt dizzy, not well and palpitations and she is wearing a smart watch that suggested probable A. fib.  Review of the tracings reveals PACs only with no recurrence of atrial fibrillation.  Blood pressures well controlled, she is having cholecystectomy 01/01/2021, she will stop Xarelto for 2-3 days prior to surgery and restart when safe as deemed by surgeon between 2 to 5 days later.  I reassured her.  No change in her physical exam.  No clinical evidence of heart failure.  I will see her back in 6 months or sooner if there is a problem.     Adrian Prows, MD, [facility] 12/25/2020, 1:57 PM Office: [phone] Pager: [phone]

## 2020-12-26 NOTE — Progress Notes (Signed)
Anesthesia Chart Review   Case: [id number] Date/Time: 01/01/21 1245   Procedure: LAPAROSCOPIC CHOLECYSTECTOMY   Anesthesia type: General   Pre-op diagnosis: biliary colic   Location: [facility] ROOM 02 / [facility]   Surgeons: Clovis Riley, MD       DISCUSSION:86 y.o. never smoker with h/o PONV, HTN, GERD, a-fib, biliary colic scheduled for above procedure 01/01/21 with Dr. Romana Juniper.   Pt last seen by cardiology 12/25/2020. Per Dr. Einar Gip, "Blood pressures well controlled, she is having cholecystectomy 01/01/2021, she will stop Xarelto for 2-3 days prior to surgery and restart when safe as deemed by surgeon between 2 to 5 days later."  Anticipate pt can proceed with planned procedure barring acute status change.   VS: BP (!) 159/57    Pulse 63    Temp 37.1 C (Oral)    Resp 18    Ht 5' (1.524 m)    Wt 77.2 kg    SpO2 99%    BMI 33.24 kg/m   PROVIDERS: Deland Pretty, MD is PCP   Adrian Prows, MD is Cardiologist  LABS: Labs reviewed: Acceptable for surgery. (all labs ordered are listed, but only abnormal results are displayed)  Labs Reviewed  BASIC METABOLIC PANEL - Abnormal; Notable for the following components:      Result Value   BUN 28 (*)    All other components within normal limits  CBC - Abnormal; Notable for the following components:   WBC 12.0 (*)    All other components within normal limits     IMAGES:   EKG: EKG 12/25/2020: Normal sinus rhythm at rate of 62 bpm, normal axis.  No evidence of ischemia, normal EKG.  CV: Echocardiogram 04/03/2020:  Normal LV systolic function with visual EF 60-65%. Left ventricle cavity  is normal in size. Normal global wall motion. Indeterminate diastolic  filling pattern, normal LAP.  Left atrial cavity is mildly dilated.  Mild (Grade I) aortic regurgitation.  Mild (Grade I) mitral regurgitation.  Mild tricuspid regurgitation. No evidence of pulmonary hypertension. RVSP  measures 30 mmHg.  Mild pulmonic regurgitation.  Prior  study dated 12/09/2015: LVEF 38%, normal diastolic function, mild  LAE, mild to moderate AR, Mild to moderate MR, Mild TR, no PHTN.   Echo 06/06/2009 Normal left ventricular systolic function  Despite normal wall thickness, there is evidence of mild diastolic dysfunction by both mitral inflow and tissue Doppler parameters.  All other chambers are normal in size and function  There is no pericardial effusion  Past Medical History:  Diagnosis Date   Arthritis    Asthma    Complication of anesthesia    difficult to wake up and agitation   Dyslipidemia    Dysrhythmia    h/o atrial couplets with short PAT, h/o a-fib during surgery (event monitor)   GERD (gastroesophageal reflux disease)    History of nuclear stress test 05/11/2009   dipyridamole; normal, low risk    Hypertension    Osteoporosis    Pneumonia    PONV (postoperative nausea and vomiting)    Postoperative atrial fibrillation ([place]) 01/11/2009   Seasonal allergies     Past Surgical History:  Procedure Laterality Date   APPENDECTOMY  1961   BLADDER SURGERY  2002   BREAST BIOPSY Left 05/09/2012   calcified and hyalinized fibroadenomatous and sclerosing adenosis   COSMETIC SURGERY  2011   1 episode of atrial fibrillation during this    HERNIA REPAIR  1999   hiatal   lump removal  05/06/2009   [facility]   NM MYOVIEW LTD  May 2011   No ischemia or infarction.   SHOULDER SURGERY Left Feb 2015   Dr. Veverly Fells - arthroscopy (rotator cuff ?debridement plus biceps tenodesis)   TRANSTHORACIC ECHOCARDIOGRAM  05/2009   EF=>55%, with normal LV systolic function, impaired LV relaxation; trace MR/TR; mild AV regurg    MEDICATIONS:  albuterol (VENTOLIN HFA) 108 (90 Base) MCG/ACT inhaler   APPLE CIDER VINEGAR PO   Budeson-Glycopyrrol-Formoterol (BREZTRI AEROSPHERE) 160-9-4.8 MCG/ACT AERO   Cholecalciferol (VITAMIN D) 50 MCG (2000 UT) CAPS   denosumab (PROLIA) 60 MG/ML SOSY injection   diltiazem (CARDIZEM CD) 120 MG 24 hr capsule    fluticasone (FLONASE) 50 MCG/ACT nasal spray   MAGNESIUM CITRATE PO   magnesium oxide (MAG-OX) 400 MG tablet   metoprolol succinate (TOPROL-XL) 100 MG 24 hr tablet   omeprazole (PRILOSEC) 20 MG capsule   PRALUENT 75 MG/ML SOAJ   rivaroxaban (XARELTO) 20 MG TABS tablet   spironolactone (ALDACTONE) 50 MG tablet   No current facility-administered medications for this encounter.     Konrad Felix Ward, PA-C [facility] [phone]

## 2020-12-26 NOTE — Anesthesia Preprocedure Evaluation (Addendum)
Anesthesia Evaluation  Patient identified by MRN, date of birth, ID band Patient awake    Reviewed: Allergy & Precautions, NPO status , Patient's Chart, lab work & pertinent test results, reviewed documented beta blocker date and time   History of Anesthesia Complications (+) PONV and history of anesthetic complications  Airway Mallampati: II  TM Distance: >3 FB Neck ROM: Full    Dental  (+) Dental Advisory Given   Pulmonary asthma ,    Pulmonary exam normal        Cardiovascular hypertension, Pt. on medications and Pt. on home beta blockers Normal cardiovascular exam+ dysrhythmias (on Xarelto) Atrial Fibrillation   TTE 03/2020: EF 60-65%, mild LAE, mild AR/MR/TR/PR     Neuro/Psych negative neurological ROS  negative psych ROS   GI/Hepatic Neg liver ROS, GERD  Medicated and Controlled,Biliary colic   Endo/Other  negative endocrine ROS  Renal/GU negative Renal ROS  negative genitourinary   Musculoskeletal  (+) Arthritis ,   Abdominal   Peds  Hematology negative hematology ROS (+)   Anesthesia Other Findings Day of surgery medications reviewed with patient.  Reproductive/Obstetrics negative OB ROS                          Anesthesia Physical Anesthesia Plan  ASA: 2  Anesthesia Plan: General   Post-op Pain Management: Tylenol PO (pre-op)   Induction: Intravenous  PONV Risk Score and Plan: 4 or greater and Propofol infusion, Dexamethasone, Ondansetron, Treatment may vary due to age or medical condition and TIVA  Airway Management Planned: Oral ETT  Additional Equipment: None  Intra-op Plan:   Post-operative Plan: Extubation in OR  Informed Consent: I have reviewed the patients History and Physical, chart, labs and discussed the procedure including the risks, benefits and alternatives for the proposed anesthesia with the patient or authorized representative who has indicated his/her  understanding and acceptance.     Dental advisory given  Plan Discussed with:   Anesthesia Plan Comments: (Patient has had difficulty awakening from anesthesia as well as significant PONV. Plan for TIVA.)     Anesthesia Quick Evaluation

## 2021-01-01 ENCOUNTER — Ambulatory Visit (HOSPITAL_COMMUNITY): Payer: Medicare Other | Admitting: Anesthesiology

## 2021-01-01 ENCOUNTER — Ambulatory Visit (HOSPITAL_COMMUNITY): Payer: Medicare Other | Admitting: Physician Assistant

## 2021-01-01 ENCOUNTER — Encounter (HOSPITAL_COMMUNITY): Payer: Self-pay | Admitting: Surgery

## 2021-01-01 ENCOUNTER — Encounter (HOSPITAL_COMMUNITY)
Admission: RE | Disposition: A | Discharge status: Home / Self Care | Payer: Self-pay | Source: Ambulatory Visit | Attending: Surgery

## 2021-01-01 ENCOUNTER — Ambulatory Visit (HOSPITAL_COMMUNITY)
Admission: RE | Admit: 2021-01-01 | Discharge: 2021-01-01 | Disposition: A | Discharge status: Home / Self Care | Payer: Medicare Other | Source: Ambulatory Visit | Attending: Surgery | Admitting: Surgery

## 2021-01-01 DIAGNOSIS — M1990 Unspecified osteoarthritis, unspecified site: Secondary | ICD-10-CM | POA: Diagnosis not present

## 2021-01-01 DIAGNOSIS — K219 Gastro-esophageal reflux disease without esophagitis: Secondary | ICD-10-CM | POA: Diagnosis not present

## 2021-01-01 DIAGNOSIS — Z9889 Other specified postprocedural states: Secondary | ICD-10-CM | POA: Diagnosis not present

## 2021-01-01 DIAGNOSIS — I10 Essential (primary) hypertension: Secondary | ICD-10-CM | POA: Insufficient documentation

## 2021-01-01 DIAGNOSIS — M810 Age-related osteoporosis without current pathological fracture: Secondary | ICD-10-CM | POA: Diagnosis not present

## 2021-01-01 DIAGNOSIS — J45909 Unspecified asthma, uncomplicated: Secondary | ICD-10-CM | POA: Insufficient documentation

## 2021-01-01 DIAGNOSIS — I4891 Unspecified atrial fibrillation: Secondary | ICD-10-CM | POA: Insufficient documentation

## 2021-01-01 DIAGNOSIS — K8044 Calculus of bile duct with chronic cholecystitis without obstruction: Secondary | ICD-10-CM | POA: Insufficient documentation

## 2021-01-01 DIAGNOSIS — I6529 Occlusion and stenosis of unspecified carotid artery: Secondary | ICD-10-CM | POA: Insufficient documentation

## 2021-01-01 DIAGNOSIS — K8010 Calculus of gallbladder with chronic cholecystitis without obstruction: Secondary | ICD-10-CM | POA: Diagnosis not present

## 2021-01-01 DIAGNOSIS — Z800 Family history of malignant neoplasm of digestive organs: Secondary | ICD-10-CM | POA: Insufficient documentation

## 2021-01-01 DIAGNOSIS — K8050 Calculus of bile duct without cholangitis or cholecystitis without obstruction: Secondary | ICD-10-CM | POA: Diagnosis not present

## 2021-01-01 DIAGNOSIS — Z7901 Long term (current) use of anticoagulants: Secondary | ICD-10-CM

## 2021-01-01 HISTORY — PX: CHOLECYSTECTOMY: SHX55

## 2021-01-01 SURGERY — LAPAROSCOPIC CHOLECYSTECTOMY
Anesthesia: General | Site: Abdomen

## 2021-01-01 MED ORDER — DOCUSATE SODIUM 100 MG PO CAPS: 100.0000 mg | ORAL_CAPSULE | Freq: Two times a day (BID) | ORAL | 0 refills | Status: AC

## 2021-01-01 MED ORDER — VANCOMYCIN HCL 1000 MG IV SOLR
INTRAVENOUS | Status: DC | PRN
  Administered 2021-01-01: 12:00:00 1000 mg via INTRAVENOUS

## 2021-01-01 MED ORDER — FENTANYL CITRATE PF 50 MCG/ML IJ SOSY
25.0000 ug | PREFILLED_SYRINGE | INTRAMUSCULAR | Status: DC | PRN
  Administered 2021-01-01 (×2): 25 ug via INTRAVENOUS

## 2021-01-01 MED ORDER — BUPIVACAINE-EPINEPHRINE (PF) 0.25% -1:200000 IJ SOLN
INTRAMUSCULAR | Status: AC
  Filled 2021-01-01: qty 30

## 2021-01-01 MED ORDER — CHLORHEXIDINE GLUCONATE 0.12 % MT SOLN
15.0000 mL | Freq: Once | OROMUCOSAL | Status: AC
  Administered 2021-01-01: 11:00:00 15 mL via OROMUCOSAL

## 2021-01-01 MED ORDER — LACTATED RINGERS IV SOLN: INTRAVENOUS | Status: DC

## 2021-01-01 MED ORDER — ORAL CARE MOUTH RINSE: 15.0000 mL | Freq: Once | OROMUCOSAL | Status: AC

## 2021-01-01 MED ORDER — PROPOFOL 10 MG/ML IV BOLUS
INTRAVENOUS | Status: AC
  Filled 2021-01-01: qty 20

## 2021-01-01 MED ORDER — PROPOFOL 10 MG/ML IV BOLUS
INTRAVENOUS | Status: DC | PRN
  Administered 2021-01-01 (×2): 100 mg via INTRAVENOUS

## 2021-01-01 MED ORDER — BUPIVACAINE-EPINEPHRINE 0.25% -1:200000 IJ SOLN
INTRAMUSCULAR | Status: DC | PRN
  Administered 2021-01-01: 30 mL

## 2021-01-01 MED ORDER — ONDANSETRON HCL 4 MG/2ML IJ SOLN
INTRAMUSCULAR | Status: DC | PRN
  Administered 2021-01-01: 4 mg via INTRAVENOUS

## 2021-01-01 MED ORDER — DEXAMETHASONE SODIUM PHOSPHATE 10 MG/ML IJ SOLN
INTRAMUSCULAR | Status: DC | PRN
  Administered 2021-01-01: 5 mg via INTRAVENOUS

## 2021-01-01 MED ORDER — LABETALOL HCL 5 MG/ML IV SOLN
INTRAVENOUS | Status: DC | PRN
  Administered 2021-01-01 (×2): 2.5 mg via INTRAVENOUS

## 2021-01-01 MED ORDER — PHENYLEPHRINE HCL (PRESSORS) 10 MG/ML IV SOLN
INTRAVENOUS | Status: AC
  Filled 2021-01-01: qty 1

## 2021-01-01 MED ORDER — VANCOMYCIN HCL IN DEXTROSE 1-5 GM/200ML-% IV SOLN
INTRAVENOUS | Status: AC
  Filled 2021-01-01: qty 200

## 2021-01-01 MED ORDER — OXYCODONE HCL 5 MG PO TABS: 5.0000 mg | ORAL_TABLET | Freq: Once | ORAL | Status: DC | PRN

## 2021-01-01 MED ORDER — SODIUM CHLORIDE 0.9 % IR SOLN
Status: DC | PRN
  Administered 2021-01-01: 1000 mL

## 2021-01-01 MED ORDER — PROPOFOL 500 MG/50ML IV EMUL
INTRAVENOUS | Status: DC | PRN
  Administered 2021-01-01: 150 ug/kg/min via INTRAVENOUS

## 2021-01-01 MED ORDER — LIDOCAINE 2% (20 MG/ML) 5 ML SYRINGE
INTRAMUSCULAR | Status: DC | PRN
  Administered 2021-01-01: 100 mg via INTRAVENOUS

## 2021-01-01 MED ORDER — OXYCODONE HCL 5 MG/5ML PO SOLN: 5.0000 mg | Freq: Once | ORAL | Status: DC | PRN

## 2021-01-01 MED ORDER — FENTANYL CITRATE PF 50 MCG/ML IJ SOSY
PREFILLED_SYRINGE | INTRAMUSCULAR | Status: AC
  Filled 2021-01-01: qty 1

## 2021-01-01 MED ORDER — FENTANYL CITRATE (PF) 250 MCG/5ML IJ SOLN
INTRAMUSCULAR | Status: AC
  Filled 2021-01-01: qty 5

## 2021-01-01 MED ORDER — ROCURONIUM BROMIDE 10 MG/ML (PF) SYRINGE
PREFILLED_SYRINGE | INTRAVENOUS | Status: DC | PRN
  Administered 2021-01-01: 60 mg via INTRAVENOUS

## 2021-01-01 MED ORDER — 0.9 % SODIUM CHLORIDE (POUR BTL) OPTIME
TOPICAL | Status: DC | PRN
  Administered 2021-01-01: 12:00:00 1000 mL

## 2021-01-01 MED ORDER — FENTANYL CITRATE (PF) 250 MCG/5ML IJ SOLN
INTRAMUSCULAR | Status: DC | PRN
  Administered 2021-01-01 (×5): 50 ug via INTRAVENOUS

## 2021-01-01 MED ORDER — MIDAZOLAM HCL 2 MG/2ML IJ SOLN
INTRAMUSCULAR | Status: AC
  Filled 2021-01-01: qty 2

## 2021-01-01 MED ORDER — TRAMADOL HCL 50 MG PO TABS: 50.0000 mg | ORAL_TABLET | Freq: Four times a day (QID) | ORAL | 0 refills | Status: DC | PRN

## 2021-01-01 MED ORDER — ACETAMINOPHEN 500 MG PO TABS
1000.0000 mg | ORAL_TABLET | Freq: Once | ORAL | Status: AC
  Administered 2021-01-01: 11:00:00 1000 mg via ORAL
  Filled 2021-01-01: qty 2

## 2021-01-01 MED ORDER — SUGAMMADEX SODIUM 200 MG/2ML IV SOLN
INTRAVENOUS | Status: DC | PRN
  Administered 2021-01-01: 250 mg via INTRAVENOUS

## 2021-01-01 SURGICAL SUPPLY — 43 items
ADH SKN CLS APL DERMABOND .7 (GAUZE/BANDAGES/DRESSINGS) ×1
APL PRP STRL LF DISP 70% ISPRP (MISCELLANEOUS) ×1
APPLIER CLIP ROT 10 11.4 M/L (STAPLE) ×3
APR CLP MED LRG 11.4X10 (STAPLE) ×1
BAG COUNTER SPONGE SURGICOUNT (BAG) IMPLANT
BAG SPEC RTRVL LRG 6X4 10 (ENDOMECHANICALS) ×1
BAG SPNG CNTER NS LX DISP (BAG)
BAG SURGICOUNT SPONGE COUNTING (BAG)
CABLE HIGH FREQUENCY MONO STRZ (ELECTRODE) ×3 IMPLANT
CHLORAPREP W/TINT 26 (MISCELLANEOUS) ×3 IMPLANT
CLIP APPLIE ROT 10 11.4 M/L (STAPLE) ×1 IMPLANT
COVER MAYO STAND STRL (DRAPES) IMPLANT
COVER SURGICAL LIGHT HANDLE (MISCELLANEOUS) ×3 IMPLANT
DECANTER SPIKE VIAL GLASS SM (MISCELLANEOUS) ×3 IMPLANT
DERMABOND ADVANCED (GAUZE/BANDAGES/DRESSINGS) ×2
DERMABOND ADVANCED .7 DNX12 (GAUZE/BANDAGES/DRESSINGS) ×1 IMPLANT
DRAPE C-ARM 42X120 X-RAY (DRAPES) IMPLANT
ELECT REM PT RETURN 15FT ADLT (MISCELLANEOUS) ×3 IMPLANT
GLOVE SURG ENC MOIS LTX SZ6 (GLOVE) ×3 IMPLANT
GLOVE SURG MICRO LTX SZ6 (GLOVE) ×3 IMPLANT
GLOVE SURG UNDER LTX SZ6.5 (GLOVE) ×3 IMPLANT
GOWN STRL REUS W/TWL LRG LVL3 (GOWN DISPOSABLE) ×3 IMPLANT
GOWN STRL REUS W/TWL XL LVL3 (GOWN DISPOSABLE) ×6 IMPLANT
GRASPER SUT TROCAR 14GX15 (MISCELLANEOUS) ×3 IMPLANT
HEMOSTAT SNOW SURGICEL 2X4 (HEMOSTASIS) IMPLANT
IRRIG SUCT STRYKERFLOW 2 WTIP (MISCELLANEOUS) ×3
IRRIGATION SUCT STRKRFLW 2 WTP (MISCELLANEOUS) ×1 IMPLANT
KIT BASIN OR (CUSTOM PROCEDURE TRAY) ×3 IMPLANT
KIT TURNOVER KIT A (KITS) IMPLANT
NDL INSUFFLATION 14GA 120MM (NEEDLE) ×1 IMPLANT
NEEDLE INSUFFLATION 14GA 120MM (NEEDLE) ×3 IMPLANT
PENCIL SMOKE EVACUATOR (MISCELLANEOUS) IMPLANT
POUCH SPECIMEN RETRIEVAL 10MM (ENDOMECHANICALS) ×3 IMPLANT
SCISSORS LAP 5X35 DISP (ENDOMECHANICALS) ×3 IMPLANT
SET CHOLANGIOGRAPH MIX (MISCELLANEOUS) IMPLANT
SET TUBE SMOKE EVAC HIGH FLOW (TUBING) ×3 IMPLANT
SLEEVE XCEL OPT CAN 5 100 (ENDOMECHANICALS) ×6 IMPLANT
SUT MNCRL AB 4-0 PS2 18 (SUTURE) ×3 IMPLANT
TOWEL OR 17X26 10 PK STRL BLUE (TOWEL DISPOSABLE) ×3 IMPLANT
TOWEL OR NON WOVEN STRL DISP B (DISPOSABLE) IMPLANT
TRAY LAPAROSCOPIC (CUSTOM PROCEDURE TRAY) ×3 IMPLANT
TROCAR BLADELESS OPT 5 100 (ENDOMECHANICALS) ×3 IMPLANT
TROCAR XCEL 12X100 BLDLESS (ENDOMECHANICALS) ×3 IMPLANT

## 2021-01-01 NOTE — Anesthesia Postprocedure Evaluation (Signed)
Anesthesia Post Note  Patient: Deborah Mcmillan  Procedure(s) Performed: LAPAROSCOPIC CHOLECYSTECTOMY (Abdomen)     Patient location during evaluation: PACU Anesthesia Type: General Level of consciousness: awake and alert Pain management: pain level controlled Vital Signs Assessment: post-procedure vital signs reviewed and stable Respiratory status: spontaneous breathing, nonlabored ventilation and respiratory function stable Cardiovascular status: blood pressure returned to baseline and stable Postop Assessment: no apparent nausea or vomiting Anesthetic complications: no   No notable events documented.  Last Vitals:  Vitals:   01/01/21 1327 01/01/21 1330  BP: (!) 146/80 (!) 142/80  Pulse: 81 79  Resp: 20 (!) 28  Temp: 36.4 C   SpO2: 99% 98%    Last Pain:  Vitals:   01/01/21 1327  TempSrc:   PainSc: 0-No pain                 Lidia Collum

## 2021-01-01 NOTE — Anesthesia Procedure Notes (Signed)
Procedure Name: Intubation Date/Time: 01/01/2021 12:20 PM Performed by: Cynda Familia, CRNA Pre-anesthesia Checklist: Patient identified, Emergency Drugs available, Suction available and Patient being monitored Patient Re-evaluated:Patient Re-evaluated prior to induction Oxygen Delivery Method: Circle System Utilized Preoxygenation: Pre-oxygenation with 100% oxygen Induction Type: IV induction and Cricoid Pressure applied Ventilation: Mask ventilation without difficulty Laryngoscope Size: Miller and 2 Tube type: Oral Number of attempts: 1 Airway Equipment and Method: Stylet Placement Confirmation: ETT inserted through vocal cords under direct vision, positive ETCO2 and breath sounds checked- equal and bilateral Secured at: 22 cm Tube secured with: Tape Dental Injury: Teeth and Oropharynx as per pre-operative assessment  Comments: Smooth IV induction Witman- intubation AM CRNA atraumatic- teeth and mouth as preop- right front tooth with chipped- unchanged- bilat BS

## 2021-01-01 NOTE — Discharge Instructions (Signed)
LAPAROSCOPIC SURGERY: POST OP INSTRUCTIONS   EAT Gradually transition to a high fiber diet with a fiber supplement over the next few weeks after discharge.  Start with a pureed / full liquid diet (see below)  WALK Walk an hour a day (cumulative- not all at once).  Control your pain to do that.    CONTROL PAIN Control pain so that you can walk, sleep, tolerate sneezing/coughing, go up/down stairs.  HAVE A BOWEL MOVEMENT DAILY Keep your bowels regular to avoid problems.  OK to try a laxative to override constipation.  OK to use an antidairrheal to slow down diarrhea.  Call if not better after 2 tries  CALL IF YOU HAVE PROBLEMS/CONCERNS Call if you are still struggling despite following these instructions. Call if you have concerns not answered by these instructions    DIET: Follow a light bland diet & liquids the first 24 hours after arrival home, such as soup, liquids, starches, etc.  Be sure to drink plenty of fluids.  Quickly advance to a usual solid diet within a few days.  Avoid fast food or heavy meals as your are more likely to get nauseated or have irregular bowels.  A low-sugar, high-fiber diet for the rest of your life is ideal.  Take your usually prescribed home medications unless otherwise directed.  PAIN CONTROL: Pain is best controlled by a usual combination of three different methods TOGETHER: Ice/Heat Over the counter pain medication Prescription pain medication Most patients will experience some swelling and bruising around the incisions.  Ice packs or heating pads (30-60 minutes up to 6 times a day) will help. Use ice for the first few days to help decrease swelling and bruising, then switch to heat to help relax tight/sore spots and speed recovery.  Some people prefer to use ice alone, heat alone, alternating between ice & heat.  Experiment to what works for you.  Swelling and bruising can take several weeks to resolve.   It is helpful to take an over-the-counter pain  medication regularly for the first few days: Naproxen (Aleve, etc)  Two 220mg tabs twice a day OR Ibuprofen (Advil, etc) Three 200mg tabs four times a day (every meal & bedtime) AND Acetaminophen (Tylenol, etc) 500-650mg four times a day (every meal & bedtime) A  prescription for pain medication (such as oxycodone, hydrocodone, tramadol, gabapentin, methocarbamol, etc) should be given to you upon discharge.  Take your pain medication as prescribed, IF NEEDED.  If you are having problems/concerns with the prescription medicine (does not control pain, nausea, vomiting, rash, itching, etc), please call us [phone] to see if we need to switch you to a different pain medicine that will work better for you and/or control your side effect better. If you need a refill on your pain medication, please give us 48 hour notice.  contact your pharmacy.  They will contact our office to request authorization. Prescriptions will not be filled after 5 pm or on week-ends  Avoid getting constipated.   Between the surgery and the pain medications, it is common to experience some constipation.   Increasing fluid intake and taking a fiber supplement (such as Metamucil, Citrucel, FiberCon, MiraLax, etc) 1-2 times a day regularly will usually help prevent this problem from occurring.   A mild laxative (prune juice, Milk of Magnesia, MiraLax, etc) should be taken according to package directions if there are no bowel movements after 48 hours.   Watch out for diarrhea.   If you have many loose   bowel movements, simplify your diet to bland foods & liquids for a few days.   Stop any stool softeners and decrease your fiber supplement.   Switching to mild anti-diarrheal medications (Kayopectate, Pepto Bismol) can help.   If this worsens or does not improve, please call us.  Wash / shower every day.  You may shower over the skin glue which is waterproof  Glue will flake off after about 2 weeks.  You may leave the incision  open to air.  You may replace a dressing/Band-Aid to cover the incision for comfort if you wish.   ACTIVITIES as tolerated:   You may resume regular (light) daily activities beginning the next day--such as daily self-care, walking, climbing stairs--gradually increasing activities as tolerated.  If you can walk 30 minutes without difficulty, it is safe to try more intense activity such as jogging, treadmill, bicycling, low-impact aerobics, swimming, etc. Save the most intensive and strenuous activity for last such as sit-ups, heavy lifting, contact sports, etc  Refrain from any heavy lifting or straining until you are off narcotics for pain control.   DO NOT PUSH THROUGH PAIN.  Let pain be your guide: If it hurts to do something, don't do it.  Pain is your body warning you to avoid that activity for another week until the pain goes down. You may drive when you are no longer taking prescription pain medication, you can comfortably wear a seatbelt, and you can safely maneuver your car and apply brakes. You may have sexual intercourse when it is comfortable.  FOLLOW UP in our office Please call [facility] at [phone] to set up an appointment to see your surgeon in the office for a follow-up appointment approximately 2-3 weeks after your surgery. Make sure that you call for this appointment the day you arrive home to insure a convenient appointment time.  10. IF YOU HAVE DISABILITY OR FAMILY LEAVE FORMS, BRING THEM TO THE OFFICE FOR PROCESSING.  DO NOT GIVE THEM TO YOUR DOCTOR.   WHEN TO CALL US [phone]: Poor pain control Reactions / problems with new medications (rash/itching, nausea, etc)  Fever over 101.5 F (38.5 C) Inability to urinate Nausea and/or vomiting Worsening swelling or bruising Continued bleeding from incision. Increased pain, redness, or drainage from the incision   The clinic staff is available to answer your questions during regular business hours (8:30am-5pm).  Please  don't hesitate to call and ask to speak to one of our nurses for clinical concerns.   If you have a medical emergency, go to the nearest emergency room or call 911.  A surgeon from [facility] is always on call at the hospitals   [facility] [address], [address], [place], [place]  [address] ? MAIN: [phone] ? TOLL FREE: [phone] ?  FAX [phone] [email or web address]  

## 2021-01-01 NOTE — Op Note (Signed)
Operative Note  Deborah Mcmillan 86 y.o. female [id number]  01/01/2021  Surgeon: Romana Juniper MD FACS  Procedure performed: Laparoscopic Cholecystectomy  Preop diagnosis: biliary colic Post-op diagnosis/intraop findings: same, chronic cholecystitis  Specimens: gallbladder  Retained items: none  EBL: minimal  Complications: none  Description of procedure: After obtaining informed consent the patient was brought to the operating room. Antibiotics were administered. SCD's were applied. General endotracheal anesthesia was initiated and a formal time-out was performed. The abdomen was prepped and draped in the usual sterile fashion and the abdomen was entered using an infraumbilical veress needle after instilling the site with local. Insufflation to 27mmHg was obtained, 61mm trocar and camera inserted, and gross inspection revealed no evidence of injury from our entry or other intraabdominal abnormalities. Two 20mm trocars were introduced in the right midclavicular and right anterior axillary lines under direct visualization and following infiltration with local. An 38mm trocar was placed in the epigastrium.   The gallbladder was retracted cephalad and the infundibulum was retracted laterally. A combination of hook electrocautery and blunt dissection was utilized to clear the peritoneum from the neck and cystic duct, circumferentially isolating the cystic artery and cystic duct and lifting the gallbladder from the cystic plate. The critical view of safety was achieved with the cystic artery, cystic duct, and liver bed visualized between them with no other structures. The artery was clipped with a two clips proximally and divided distally with cautery. The cystic duct was ligated with three clips on the proximal end and one distally, and divided sharply. The gallbladder was dissected from the liver plate using electrocautery. Once freed the gallbladder was placed in an endocatch bag and removed intact  through the epigastric trocar site.  A scant amount of oozing on the liver bed was controlled with cautery. The right upper quadrant was irrigated and aspirated, the effluent was clear. Hemostasis was once again confirmed, and reinspection of the abdomen revealed no injuries. The clips were well opposed without any bile leak from the duct or the liver bed. The 38mm trocar site in the epigastrium was closed with a 0 vicryl in the fascia under direct visualization using a PMI device. The trocars were removed under direct visualization and the sites confirmed to be hemostatic internally. The abdomen was desufflated and the final (umbilical) trocar was removed. The skin incisions were closed with subcuticular monocryl and Dermabond. The patient was awakened, extubated and transported to the recovery room in stable condition.    All counts were correct at the completion of the case.

## 2021-01-01 NOTE — Transfer of Care (Signed)
Immediate Anesthesia Transfer of Care Note  Patient: Deborah Mcmillan  Procedure(s) Performed: LAPAROSCOPIC CHOLECYSTECTOMY (Abdomen)  Patient Location: PACU  Anesthesia Type:General  Level of Consciousness: awake, alert  and oriented  Airway & Oxygen Therapy: Patient Spontanous Breathing and Patient connected to face mask  Post-op Assessment: Report given to RN and Post -op Vital signs reviewed and stable  Post vital signs: Reviewed and stable  Last Vitals:  Vitals Value Taken Time  BP 142/80 01/01/21 1330  Temp 36.4 C 01/01/21 1327  Pulse 68 01/01/21 1338  Resp 19 01/01/21 1338  SpO2 96 % 01/01/21 1338  Vitals shown include unvalidated device data.  Last Pain:  Vitals:   01/01/21 1327  TempSrc:   PainSc: 0-No pain         Complications: No notable events documented.

## 2021-01-01 NOTE — H&P (Signed)
Deborah Mcmillan [id number]    Referring Provider:  Self     Subjective    Chief Complaint: No chief complaint on file.       History of Present Illness:    86 year old woman with history of atrial fibrillation, hypertension, carotid atherosclerosis, bronchial asthma, multiple medication allergies and intolerances, GERD status post laparoscopic hiatal hernia repair and Nissen in 1999, osteoporosis presents for evaluation of symptomatic gallstones.  Her gastroenterologist is Dr. Therisa Doyne.  She was seen there a few months ago noting concerns with constipation and desiring screening colonoscopy given prevalent family history of colon cancer.  She did note some right upper quadrant pain and subsequently underwent an ultrasound in May of this year which demonstrates cholelithiasis without any sonographic evidence of cholecystitis, common bile duct 3 mm, and diffuse heterogeneity of the hepatic parenchyma which may be a nonspecific indicator of hepatocellular dysfunction.   She presented to the emergency room about 1 month ago with shortness of breath and palpitations and was found to be in A. fib with RVR which spontaneously converted to sinus rhythm.  She is on Eliquis.  Chest x-ray about 1 month ago with borderline cardiomegaly which is stable, left basilar atelectasis.  She was seen last month by her cardiologist Dr. Einar Gip and he does note that he would consider her to be low risk for cholecystectomy. She also sees Dr. Richmond Campbell with pulmonology and is due for follow-up, noting that she was supposed to have had some pulmonary function testing.   She describes pain in the right subcostal region, epigastrium, and radiating to her right mid back as well as down into her right lower quadrant.  This does occur after eating but not always.  She has not identified any specific foods that irritate it.  She notes that she could be just walking around and the pain will come on from that as well.  The pain is  aggravated also by the band of her bra.   Previous abdominal surgeries include appendectomy, bladder surgery, hiatal hernia repair 1999   Review of Systems: A complete review of systems was obtained from the patient.  I have reviewed this information and discussed as appropriate with the patient.  See HPI as well for other ROS.     Medical History: Past Medical History         Past Medical History:  Diagnosis Date   Asthma, unspecified asthma severity, unspecified whether complicated, unspecified whether persistent     GERD (gastroesophageal reflux disease)     Hypertension          There is no problem list on file for this patient.     Past Surgical History  History reviewed. No pertinent surgical history.      Allergies           Allergies  Allergen Reactions   Codeine Vomiting      Pain & upset stomach     Colesevelam Nausea and Other (See Comments)      Stomach did not feel good     Ezetimibe Other (See Comments)   Niacin Rash   Penicillins Rash   Sulfa (Sulfonamide Antibiotics) Vomiting   Zoledronic Acid Other (See Comments)      Caused severe jaw pain                   Current Outpatient Medications on File Prior to Visit  Medication Sig Dispense Refill   alirocumab (PRALUENT PEN) 75 mg/mL pen injector Praluent  Pen 75 mg/mL subcutaneous pen injector  INJECT 75 MG UNDER THE SKIN EVERY 14 DAYS       fluticasone propion-salmeteroL (ADVAIR HFA) 230-21 mcg/actuation inhaler Advair HFA 230 mcg-21 mcg/actuation aerosol inhaler       metoprolol succinate (TOPROL-XL) 100 MG XL tablet metoprolol succinate ER 100 mg tablet,extended release 24 hr       spironolactone (ALDACTONE) 50 MG tablet spironolactone 50 mg tablet       albuterol 90 mcg/actuation inhaler albuterol sulfate HFA 90 mcg/actuation aerosol inhaler  INHALE 2 PUFFS BY MOUTH EVERY 6 HOURS FOR 25 DAYS AS NEEDED       omeprazole (PRILOSEC) 20 MG DR capsule omeprazole 20 mg capsule,delayed release         No current facility-administered medications on file prior to visit.      Family History           Family History  Problem Relation Age of Onset   Skin cancer Mother     High blood pressure (Hypertension) Mother     Hyperlipidemia (Elevated cholesterol) Father     Colon cancer Sister     Hyperlipidemia (Elevated cholesterol) Brother     Colon cancer Brother          Social History         Tobacco Use  Smoking Status Never Smoker  Smokeless Tobacco Never Used      Social History  Social History           Socioeconomic History   Marital status: Single  Tobacco Use   Smoking status: Never Smoker   Smokeless tobacco: Never Used  Scientific laboratory technician Use: Never used  Substance and Sexual Activity   Alcohol use: Not Currently   Drug use: Defer   Sexual activity: Defer        Objective:           Vitals:    09/11/20 1110  BP: 138/70  Pulse: 77  Temp: 36.8 C (98.3 F)  SpO2: 98%  Weight: 78.7 kg (173 lb 6.4 oz)  Height: 152.4 cm (5')    Body mass index is 33.86 kg/m.   Alert, well-appearing Unlabored respirations Extraocular motions intact, no scleral icterus Abdomen is soft, focally tender in the right costal margin and subcostal area, non distended.   Assessment and Plan:  Diagnoses and all orders for this visit:   Biliary colic -     [facility] Case Posting Request; Future     She does have fairly classic symptoms of right upper quadrant pain radiating to the epigastrium into the back, that occurs almost immediately after eating frequently but not always.  I discussed with her that activity is not a typical aggravating factor for pain from biliary etiology, and she may have a musculoskeletal component to this as well given her history of osteoporosis.  Given the overall picture I do think it would be reasonable to proceed with laparoscopic cholecystectomy.  We discussed the technique and discussed the relevant anatomy using a diagram to demonstrate.   Discussed risks of surgery including bleeding, pain, scarring, intraabdominal injury specifically to the common bile duct and sequelae, bile leak, conversion to open surgery, failure to resolve symptoms, blood clots/ pulmonary embolus, heart attack, arrhythmia, pneumonia, stroke, death. Questions welcomed and answered to patient's satisfaction.  She is agreeable to proceed with surgery.  We will request clearance from her pulmonologist and her cardiologist.       Bobbe Medico, MD

## 2021-01-02 ENCOUNTER — Encounter (HOSPITAL_COMMUNITY): Payer: Self-pay | Admitting: Surgery

## 2021-01-02 LAB — SURGICAL PATHOLOGY

## 2021-01-23 ENCOUNTER — Ambulatory Visit: Payer: BLUE CROSS/BLUE SHIELD | Admitting: Cardiology

## 2021-02-04 DIAGNOSIS — M545 Low back pain, unspecified: Secondary | ICD-10-CM | POA: Diagnosis not present

## 2021-02-04 DIAGNOSIS — M25551 Pain in right hip: Secondary | ICD-10-CM | POA: Diagnosis not present

## 2021-02-16 ENCOUNTER — Other Ambulatory Visit: Payer: Self-pay

## 2021-02-16 ENCOUNTER — Telehealth: Payer: Self-pay | Admitting: Cardiology

## 2021-02-16 DIAGNOSIS — I480 Paroxysmal atrial fibrillation: Secondary | ICD-10-CM

## 2021-02-16 MED ORDER — RIVAROXABAN 20 MG PO TABS: 20.0000 mg | ORAL_TABLET | Freq: Every day | ORAL | 3 refills | Status: DC

## 2021-02-16 NOTE — Telephone Encounter (Signed)
Daisy calling from [facility] in reference to an authorization to be done on for xarelto for this patient. Says they sent a form on 02/03/21 and have not heard anything. Today would be the final attempt to contact. Phone number is [phone].

## 2021-02-18 ENCOUNTER — Ambulatory Visit: Payer: Medicare Other | Admitting: Pulmonary Disease

## 2021-02-18 ENCOUNTER — Ambulatory Visit: Payer: BLUE CROSS/BLUE SHIELD | Admitting: Cardiology

## 2021-02-19 ENCOUNTER — Encounter: Payer: Self-pay | Admitting: Pulmonary Disease

## 2021-02-19 ENCOUNTER — Other Ambulatory Visit: Payer: Self-pay

## 2021-02-19 ENCOUNTER — Ambulatory Visit (INDEPENDENT_AMBULATORY_CARE_PROVIDER_SITE_OTHER): Payer: Medicare Other | Admitting: Pulmonary Disease

## 2021-02-19 VITALS — BP 124/62 | HR 53 | Ht 60.0 in | Wt 170.8 lb

## 2021-02-19 DIAGNOSIS — J4540 Moderate persistent asthma, uncomplicated: Secondary | ICD-10-CM | POA: Diagnosis not present

## 2021-02-19 DIAGNOSIS — R0609 Other forms of dyspnea: Secondary | ICD-10-CM

## 2021-02-19 LAB — CBC WITH DIFFERENTIAL/PLATELET
Basophils Absolute: 0.1 10*3/uL (ref 0.0–0.1)
Basophils Relative: 0.5 % (ref 0.0–3.0)
Eosinophils Absolute: 0.2 10*3/uL (ref 0.0–0.7)
Eosinophils Relative: 2.1 % (ref 0.0–5.0)
HCT: 42.9 % (ref 36.0–46.0)
Hemoglobin: 14.3 g/dL (ref 12.0–15.0)
Lymphocytes Relative: 31.1 % (ref 12.0–46.0)
Lymphs Abs: 3.7 10*3/uL (ref 0.7–4.0)
MCHC: 33.4 g/dL (ref 30.0–36.0)
MCV: 92.4 fl (ref 78.0–100.0)
Monocytes Absolute: 1 10*3/uL (ref 0.1–1.0)
Monocytes Relative: 8.1 % (ref 3.0–12.0)
Neutro Abs: 7 10*3/uL (ref 1.4–7.7)
Neutrophils Relative %: 58.2 % (ref 43.0–77.0)
Platelets: 233 10*3/uL (ref 150.0–400.0)
RBC: 4.64 Mil/uL (ref 3.87–5.11)
RDW: 13.2 % (ref 11.5–15.5)
WBC: 12 10*3/uL — ABNORMAL HIGH (ref 4.0–10.5)

## 2021-02-19 MED ORDER — STIOLTO RESPIMAT 2.5-2.5 MCG/ACT IN AERS: 2.0000 | INHALATION_SPRAY | Freq: Every day | RESPIRATORY_TRACT | 0 refills | Status: DC

## 2021-02-19 NOTE — Progress Notes (Signed)
@Patient  ID: Deborah Mcmillan, female    DOB: 01/10/1935, 87 y.o.   MRN: [id number]  Chief Complaint  Patient presents with   Follow-up    3 mo f/u for DOE. Feels like she is not able to get enough air in. Only able to use the Breztri once daily due to the side effects of dry mouth and sore and losing her voice.     Referring provider: Deland Pretty, MD  HPI:   87 y.o. woman whom we are seeing in follow up for evaluation of dyspnea on exertion.    Returns for routine follow-up.  At last visit prescribed Breztri.  Also treated for an exacerbation of asthma with steroids.  Symptoms had improved.  Underwent cholecystectomy late 12/2020 without issue.  Things went great, no breathing issues.  Unfortunately again developed mouth sores while using the Breztri.  Cut down to about once a day after the surgery.  Since then, has had resumption of chest tightness some mild dyspnea on exertion.  This has been much improved on Breztri.  Discussed at length this is but just on a rock and a hard place.  She has not been able to tolerate ICS due to side effects.  These have helped his symptoms but the side effects are not tolerable.  Discussed trialing different inhalers and role and rationale of biologic therapy in the future.  HPI initial visit Patient has chronic dyspnea on exertion over the last couple years or so.  Maybe slight worsening over the last few months.  She thinks she had COVID prior to reliable testing in late 2019.  She again had in the fall 2020.  Since then her breathing has been worse, dyspnea on exertion.  Again had COVID early 2022.  Breathing worse with stairs or inclines.  Can be present over longer distance flat surface.  Relieved by rest.  Using Asmanex for her asthma.  This is not made much change in her dyspnea.  No timing during the day where things are better or worse.  No environmental factors to account for the change.  She does note seasonal allergies and may be breathing a bit  worse when pollen in the spring.  Has been more congested over the last few weeks with pollen season.  No other alleviating or exacerbating factors.  Dyspnea described as moderate.  She does note she is much less active over the last year or 2 compared to prepandemic.  More times indoors.  Also notes mild 10 to 15 pound weight gain.  Chest x-ray most recent chest imaging in 2019 reviewed interpreted as clear lungs, no effusion, infiltrate, pneumothorax.  PMH: Asthma, hypertension, GERD Surgical history: Appendectomy, shoulder surgery, bladder surgery, Family history: Mother brother and sister with various forms of cancer, no pulmonary disease in first-degree relatives Social history: Never smoker, retired, lives in [place] / Pulmonary Flowsheets:   ACT:  Asthma Control Test ACT Total Score  11/18/2020 19    MMRC: No flowsheet data found.  Epworth:  No flowsheet data found.  Tests:   FENO:  No results found for: NITRICOXIDE  PFT: PFT Results Latest Ref Rng & Units 09/24/2020  FVC-Pre L 1.68  FVC-Predicted Pre % 85  FVC-Post L 1.71  FVC-Predicted Post % 87  Pre FEV1/FVC % % 76  Post FEV1/FCV % % 80  FEV1-Pre L 1.28  FEV1-Predicted Pre % 89  FEV1-Post L 1.38  DLCO uncorrected ml/min/mmHg 14.17  DLCO UNC% % 87  DLCO  corrected ml/min/mmHg 13.31  DLCO COR %Predicted % 82  DLVA Predicted % 95  Personally reviewed and interpreted as normal spirometry.  No bronchodilator response.  DLCO within normal limits.  WALK:  No flowsheet data found.  Imaging: Personally reviewed and as per EMR discussion this note   Lab Results: Personally reviewed, eosinophils 200 8/22 CBC    Component Value Date/Time   WBC 12.0 (H) 12/18/2020 1332   RBC 4.60 12/18/2020 1332   HGB 14.5 12/18/2020 1332   HCT 42.8 12/18/2020 1332   PLT 270 12/18/2020 1332   MCV 93.0 12/18/2020 1332   MCH 31.5 12/18/2020 1332   MCHC 33.9 12/18/2020 1332   RDW 12.7 12/18/2020 1332    LYMPHSABS 3.1 08/15/2020 1104   MONOABS 0.8 08/15/2020 1104   EOSABS 0.2 08/15/2020 1104   BASOSABS 0.1 08/15/2020 1104    BMET    Component Value Date/Time   NA 138 12/18/2020 1332   K 4.2 12/18/2020 1332   CL 104 12/18/2020 1332   CO2 24 12/18/2020 1332   GLUCOSE 96 12/18/2020 1332   BUN 28 (H) 12/18/2020 1332   CREATININE 0.91 12/18/2020 1332   CALCIUM 9.7 12/18/2020 1332   GFRNONAA >60 12/18/2020 1332   GFRAA >60 12/25/2017 0600    BNP No results found for: BNP  ProBNP No results found for: PROBNP  Specialty Problems   None  Allergies  Allergen Reactions   Calcium-Containing Compounds Other (See Comments)    Muscle cramping   Magnesium-Containing Compounds Itching and Rash   Tape Itching and Rash    Plastic, and "brown tape"   Cefuroxime Axetil Other (See Comments)    leg pain   Codeine Nausea Only    Pain & upset stomach   Crestor [Rosuvastatin] Other (See Comments)    Statin intolerance   Lodine [Etodolac] Other (See Comments)    Upset stomach   Niacin And Related Rash    "on fire"   Penicillins Itching, Swelling and Rash    She reports some swelling and itchy.    Reclast [Zoledronic Acid] Other (See Comments)    Caused severe jaw pain   Sulfa Antibiotics Other (See Comments)    Pain & upset stomach   Tetracyclines & Related Rash    Upset stomach   Welchol [Colesevelam] Nausea Only and Other (See Comments)    Stomach did not feel good   Zetia [Ezetimibe] Other (See Comments)    Muscle pain    Immunization History  Administered Date(s) Administered   DTP 05/13/2007   Fluad Quad(high Dose 65+) 09/22/2018   Influenza Split 10/14/2012   Influenza, High Dose Seasonal PF 10/14/2012, 09/04/2014, 11/04/2015   Influenza, Quadrivalent, Recombinant, Inj, Pf 10/19/2017, 09/22/2018, 10/01/2020   Influenza-Unspecified 09/27/2013, 10/11/2017   PFIZER(Purple Top)SARS-COV-2 Vaccination 03/29/2019, 04/23/2019, 01/01/2020   Pneumococcal Polysaccharide-23  02/28/2004   Td 10/11/2007   Tdap 05/13/2011   Zoster, Unspecified 07/10/2010    Past Medical History:  Diagnosis Date   Arthritis    Asthma    Complication of anesthesia    difficult to wake up and agitation   Dyslipidemia    Dysrhythmia    h/o atrial couplets with short PAT, h/o a-fib during surgery (event monitor)   GERD (gastroesophageal reflux disease)    History of nuclear stress test 05/11/2009   dipyridamole; normal, low risk    Hypertension    Osteoporosis    Pneumonia    PONV (postoperative nausea and vomiting)    Postoperative atrial fibrillation ([place]) 01/11/2009  Seasonal allergies     Tobacco History: Social History   Tobacco Use  Smoking Status Never  Smokeless Tobacco Never   Counseling given: Not Answered   Continue to not smoke  Outpatient Encounter Medications as of 02/19/2021  Medication Sig   albuterol (VENTOLIN HFA) 108 (90 Base) MCG/ACT inhaler Inhale 1 puff into the lungs every 4 (four) hours as needed for wheezing or shortness of breath.   Cholecalciferol (VITAMIN D) 50 MCG (2000 UT) CAPS Take 1 capsule by mouth daily.   denosumab (PROLIA) 60 MG/ML SOSY injection Inject 60 mg into the skin every 6 (six) months.   diltiazem (CARDIZEM CD) 120 MG 24 hr capsule Take 1 capsule (120 mg total) by mouth daily. (Patient taking differently: Take 120 mg by mouth daily with supper.)   fluticasone (FLONASE) 50 MCG/ACT nasal spray Place 1 spray into both nostrils daily.   MAGNESIUM CITRATE PO Take 250 mg by mouth in the morning.   magnesium oxide (MAG-OX) 400 MG tablet Take 400 mg by mouth daily.   metoprolol succinate (TOPROL-XL) 100 MG 24 hr tablet TAKE 1 TABLET(100 MG) BY MOUTH EVERY EVENING   omeprazole (PRILOSEC) 20 MG capsule Take 20 mg by mouth in the morning.   PRALUENT 75 MG/ML SOAJ INJECT 75 MG UNDER THE SKIN EVERY 14 DAYS   rivaroxaban (XARELTO) 20 MG TABS tablet Take 1 tablet (20 mg total) by mouth daily with supper.   spironolactone (ALDACTONE)  50 MG tablet TAKE 1 TABLET BY MOUTH EVERY MORNING   Tiotropium Bromide-Olodaterol (STIOLTO RESPIMAT) 2.5-2.5 MCG/ACT AERS Inhale 2 puffs into the lungs daily.   traMADol (ULTRAM) 50 MG tablet Take 1 tablet (50 mg total) by mouth every 6 (six) hours as needed.   [DISCONTINUED] Budeson-Glycopyrrol-Formoterol (BREZTRI AEROSPHERE) 160-9-4.8 MCG/ACT AERO Inhale 2 puffs into the lungs in the morning and at bedtime.   [DISCONTINUED] APPLE CIDER VINEGAR PO Take 1-2 tablets by mouth daily. (Patient not taking: Reported on 12/25/2020)   No facility-administered encounter medications on file as of 02/19/2021.     Review of Systems  Review of Systems  N/A Physical Exam  BP 124/62    Pulse (!) 53    Ht 5' (1.524 m)    Wt 170 lb 12.8 oz (77.5 kg)    SpO2 99%    BMI 33.36 kg/m   Wt Readings from Last 5 Encounters:  02/19/21 170 lb 12.8 oz (77.5 kg)  01/01/21 176 lb 6.4 oz (80 kg)  12/25/20 176 lb 6.4 oz (80 kg)  12/18/20 170 lb 3.2 oz (77.2 kg)  11/18/20 174 lb (78.9 kg)    BMI Readings from Last 5 Encounters:  02/19/21 33.36 kg/m  01/01/21 34.45 kg/m  12/25/20 34.45 kg/m  12/18/20 33.24 kg/m  11/18/20 33.98 kg/m     Physical Exam General: Well-appearing, no acute distress Eyes: EOMI, icterus Neck: Supple no JVP Cardiovascular: Regular rate and rhythm, no murmurs Pulmonary: Clear, normal work of breathing on room air Neuro: Normal gait, no weakness Psych: Normal mood, full affect   Assessment & Plan:   DOE: Likely multifactorial in the setting of known prior asthma, mild weight gain, deconditioning with decreased activity.  Chest x-ray clear.  PFTs normal.  Asthma:  Well-controlled over time.  However, fear that COVID-19 infection 2022 triggered worsening asthma symptoms.  Avoiding Breo given allergy to magnesium stearate.  Advair caused mouth sores.  High-dose Symbicort without significant improvement.  Prescribed Breztri 11/2020 with improved symptoms however again developed  mouth sores and  cannot tolerate twice daily use with worsening symptoms only using daily.  Eosinophils 200 8/22.  Check IgE today.  Stop Breztri, trial Stiolto with dual bronchodilators.  Recheck eos today.  Consider biologic therapy at next visit if symptoms not well controlled on LAMA/LABA therapy.   Return in about 4 weeks (around 03/19/2021).   Lanier Clam, MD 02/19/2021

## 2021-02-19 NOTE — Patient Instructions (Signed)
Nice to see you  Stop Nurse, mental health 2 puffs daily  We will get blood work today to further evaluate candidacy for injections to treat this  Return to clinic in 4 weeks or sooner as needed

## 2021-02-20 LAB — IGE: IgE (Immunoglobulin E), Serum: 70 kU/L (ref <=114)

## 2021-02-24 ENCOUNTER — Other Ambulatory Visit (HOSPITAL_COMMUNITY): Payer: Self-pay

## 2021-02-24 ENCOUNTER — Telehealth: Payer: Self-pay | Admitting: Pulmonary Disease

## 2021-02-24 ENCOUNTER — Telehealth: Payer: Self-pay | Admitting: Pharmacy Technician

## 2021-02-24 DIAGNOSIS — M810 Age-related osteoporosis without current pathological fracture: Secondary | ICD-10-CM | POA: Diagnosis not present

## 2021-02-24 NOTE — Telephone Encounter (Signed)
Patient Advocate Encounter  Received notification from [facility] ([employer]) that prior authorization for STIOLTO is required.   PA submitted on 2.14.23 Key [id number] Status is pending   [facility] will continue to follow  Luciano Cutter, CPhT Patient Advocate Phone: [phone] Fax:  [phone]

## 2021-02-24 NOTE — Telephone Encounter (Signed)
Hey I called this patient and she stated that her inhlaer Stiolito was denied and she was not given a reason as to why it was denied. She was given a number and a reference number  Number: [phone]  Ref Number:  [id number]  Can we try to give her a reason as to why this was denied for her and if she needs a PA for this inhaler.  Thank you

## 2021-02-25 ENCOUNTER — Other Ambulatory Visit (HOSPITAL_COMMUNITY): Payer: Self-pay

## 2021-02-25 ENCOUNTER — Telehealth: Payer: Self-pay | Admitting: Pulmonary Disease

## 2021-02-25 NOTE — Telephone Encounter (Signed)
Called patient and let her know that I did fax off the appeal paperwork for her inhaler and that in the mean time it is ok for her to use the [facility] inhaler. And that we will revisit this when we hear back about the appeal. Nothing further at this time

## 2021-02-25 NOTE — Telephone Encounter (Signed)
Received a fax regarding Prior Authorization from [facility] for [place]. Authorization has been DENIED because IT DOES NOT MEET THE "MEDICALLY ACCEPTED INDICATION". ALTERNATIVES WOULD BE:  ANORO-$20 [id number]

## 2021-02-25 NOTE — Telephone Encounter (Signed)
Printed out appeal forms and MH has signed papers. Contacted patient to let her know we are appealing the denial. And that we will keep her up to date. Nothing further needed at this time

## 2021-02-25 NOTE — Telephone Encounter (Signed)
Hey this is the patient that we are doing the appeal on for her Stiolto inhaler. Are you okay with her using the Breztri inhaler in the meantime. And she states that the albuterol inhaler is not working for her any more.   FYI I just faxed the appeal paperwork for the inhaler today 02/25/2021  Please advise

## 2021-02-25 NOTE — Telephone Encounter (Signed)
Hey kelly,  Could you send me the appeal forms for her for the inhaler. And I am going to talk to [facility] to see what he wants to do in the mean time.  Thanks    Zollie Clemence

## 2021-02-25 NOTE — Telephone Encounter (Signed)
Yes - ok to use Breztri for now

## 2021-02-26 LAB — ALLERGEN PROFILE, PERENNIAL ALLERGEN IGE
Alternaria Alternata IgE: <0.1 kU/L
Aspergillus Fumigatus IgE: 0.19 kU/L — AB
Aureobasidi Pullulans IgE: <0.1 kU/L
Candida Albicans IgE: <0.1 kU/L
Cat Dander IgE: <0.1 kU/L
Chicken Feathers IgE: <0.1 kU/L
Cladosporium Herbarum IgE: <0.1 kU/L
Cow Dander IgE: <0.1 kU/L
D Farinae IgE: <0.1 kU/L
D Pteronyssinus IgE: <0.1 kU/L
Dog Dander IgE: <0.1 kU/L
Duck Feathers IgE: <0.1 kU/L
Goose Feathers IgE: <0.1 kU/L
Mouse Urine IgE: <0.1 kU/L
Mucor Racemosus IgE: <0.1 kU/L
Penicillium Chrysogen IgE: <0.1 kU/L
Phoma Betae IgE: <0.1 kU/L
Setomelanomma Rostrat: <0.1 kU/L
Stemphylium Herbarum IgE: <0.1 kU/L

## 2021-02-27 ENCOUNTER — Telehealth: Payer: Self-pay | Admitting: Pulmonary Disease

## 2021-03-09 NOTE — Telephone Encounter (Signed)
PA likely denied due to Diagnosis of unspecified asthma was submitted for PA.Correct Diagnosis is- Moderate persistent asthma without complication   Please try to resubmit PA or will need to submit appeal with corrected diagnosis.  Use Dr. Kavin Leech chart note dated- 02/19/2021

## 2021-03-12 ENCOUNTER — Other Ambulatory Visit: Payer: Self-pay | Admitting: Cardiology

## 2021-03-19 ENCOUNTER — Encounter: Payer: Self-pay | Admitting: Pulmonary Disease

## 2021-03-19 ENCOUNTER — Ambulatory Visit (INDEPENDENT_AMBULATORY_CARE_PROVIDER_SITE_OTHER): Payer: Medicare Other | Admitting: Pulmonary Disease

## 2021-03-19 ENCOUNTER — Other Ambulatory Visit: Payer: Self-pay

## 2021-03-19 VITALS — BP 134/64 | HR 72 | Temp 98.6°F | Ht 60.0 in | Wt 170.0 lb

## 2021-03-19 DIAGNOSIS — J302 Other seasonal allergic rhinitis: Secondary | ICD-10-CM | POA: Diagnosis not present

## 2021-03-19 DIAGNOSIS — J4540 Moderate persistent asthma, uncomplicated: Secondary | ICD-10-CM | POA: Diagnosis not present

## 2021-03-19 DIAGNOSIS — H6690 Otitis media, unspecified, unspecified ear: Secondary | ICD-10-CM | POA: Diagnosis not present

## 2021-03-19 MED ORDER — LEVOFLOXACIN 750 MG PO TABS: 750.0000 mg | ORAL_TABLET | Freq: Every day | ORAL | 0 refills | Status: DC

## 2021-03-19 MED ORDER — STIOLTO RESPIMAT 2.5-2.5 MCG/ACT IN AERS: 2.0000 | INHALATION_SPRAY | Freq: Every day | RESPIRATORY_TRACT | 0 refills | Status: DC

## 2021-03-19 MED ORDER — FLUTICASONE PROPIONATE 50 MCG/ACT NA SUSP: NASAL | 2 refills | Status: DC

## 2021-03-19 NOTE — Progress Notes (Signed)
$'@Patient'Z$  ID: Deborah Mcmillan, female    DOB: 06/07/1934, 87 y.o.   MRN: [id number]  Chief Complaint  Patient presents with   Follow-up    4 week follow up. Pt states she doesn't know if her allergies are messing up or if she is getting sick. Sore throat, ear aches, and headaches and randomly a cough     Referring provider: Deland Pretty, MD  HPI:   87 y.o. woman whom we are seeing in follow up for evaluation of dyspnea on exertion.  Multiple telephone encounters involving pharmacy colleagues in office staff regarding medication issues reviewed.  Returns for follow-up.   At last visit, she was having ongoing oral ulcers or sores as well as hoarseness while taking Breztri.  Similar symptoms with Symbicort in the past.  Given intolerance of what sounds like ICS, prescribe Stiolto.  Insurance is refusing to approve this.  Multiple pills sent.  Patient also called.  We will continue to work on this.  Over the last couple weeks, really last week, increased nasal congestion, sinus pressure/headache.  Also with right ear pain.   HPI initial visit Patient has chronic dyspnea on exertion over the last couple years or so.  Maybe slight worsening over the last few months.  She thinks she had COVID prior to reliable testing in late 2019.  She again had in the fall 2020.  Since then her breathing has been worse, dyspnea on exertion.  Again had COVID early 2022.  Breathing worse with stairs or inclines.  Can be present over longer distance flat surface.  Relieved by rest.  Using Asmanex for her asthma.  This is not made much change in her dyspnea.  No timing during the day where things are better or worse.  No environmental factors to account for the change.  She does note seasonal allergies and may be breathing a bit worse when pollen in the spring.  Has been more congested over the last few weeks with pollen season.  No other alleviating or exacerbating factors.  Dyspnea described as moderate.  She does note  she is much less active over the last year or 2 compared to prepandemic.  More times indoors.  Also notes mild 10 to 15 pound weight gain.  Chest x-ray most recent chest imaging in 2019 reviewed interpreted as clear lungs, no effusion, infiltrate, pneumothorax.  PMH: Asthma, hypertension, GERD Surgical history: Appendectomy, shoulder surgery, bladder surgery, Family history: Mother brother and sister with various forms of cancer, no pulmonary disease in first-degree relatives Social history: Never smoker, retired, lives in [place] / Pulmonary Flowsheets:   ACT:  Asthma Control Test ACT Total Score  11/18/2020 19    MMRC: No flowsheet data found.  Epworth:  No flowsheet data found.  Tests:   FENO:  No results found for: NITRICOXIDE  PFT: PFT Results Latest Ref Rng & Units 09/24/2020  FVC-Pre L 1.68  FVC-Predicted Pre % 85  FVC-Post L 1.71  FVC-Predicted Post % 87  Pre FEV1/FVC % % 76  Post FEV1/FCV % % 80  FEV1-Pre L 1.28  FEV1-Predicted Pre % 89  FEV1-Post L 1.38  DLCO uncorrected ml/min/mmHg 14.17  DLCO UNC% % 87  DLCO corrected ml/min/mmHg 13.31  DLCO COR %Predicted % 82  DLVA Predicted % 95  Personally reviewed and interpreted as normal spirometry.  No bronchodilator response.  DLCO within normal limits.  WALK:  No flowsheet data found.  Imaging: Personally reviewed and as per EMR discussion  this note   Lab Results: Personally reviewed, eosinophils 200 8/22 CBC    Component Value Date/Time   WBC 12.0 (H) 02/19/2021 1148   RBC 4.64 02/19/2021 1148   HGB 14.3 02/19/2021 1148   HCT 42.9 02/19/2021 1148   PLT 233.0 02/19/2021 1148   MCV 92.4 02/19/2021 1148   MCH 31.5 12/18/2020 1332   MCHC 33.4 02/19/2021 1148   RDW 13.2 02/19/2021 1148   LYMPHSABS 3.7 02/19/2021 1148   MONOABS 1.0 02/19/2021 1148   EOSABS 0.2 02/19/2021 1148   BASOSABS 0.1 02/19/2021 1148    BMET    Component Value Date/Time   NA 138 12/18/2020 1332   K  4.2 12/18/2020 1332   CL 104 12/18/2020 1332   CO2 24 12/18/2020 1332   GLUCOSE 96 12/18/2020 1332   BUN 28 (H) 12/18/2020 1332   CREATININE 0.91 12/18/2020 1332   CALCIUM 9.7 12/18/2020 1332   GFRNONAA >60 12/18/2020 1332   GFRAA >60 12/25/2017 0600    BNP No results found for: BNP  ProBNP No results found for: PROBNP  Specialty Problems   None  Allergies  Allergen Reactions   Calcium-Containing Compounds Other (See Comments)    Muscle cramping   Magnesium-Containing Compounds Itching and Rash   Tape Itching and Rash    Plastic, and "brown tape"   Cefuroxime Axetil Other (See Comments)    leg pain   Codeine Nausea Only    Pain & upset stomach   Crestor [Rosuvastatin] Other (See Comments)    Statin intolerance   Lodine [Etodolac] Other (See Comments)    Upset stomach   Niacin And Related Rash    "on fire"   Penicillins Itching, Swelling and Rash    She reports some swelling and itchy.    Reclast [Zoledronic Acid] Other (See Comments)    Caused severe jaw pain   Sulfa Antibiotics Other (See Comments)    Pain & upset stomach   Tetracyclines & Related Rash    Upset stomach   Welchol [Colesevelam] Nausea Only and Other (See Comments)    Stomach did not feel good   Zetia [Ezetimibe] Other (See Comments)    Muscle pain    Immunization History  Administered Date(s) Administered   DTP 05/13/2007   Fluad Quad(high Dose 65+) 09/22/2018   Influenza Split 10/14/2012   Influenza, High Dose Seasonal PF 10/14/2012, 09/04/2014, 11/04/2015   Influenza, Quadrivalent, Recombinant, Inj, Pf 10/19/2017, 09/22/2018, 10/01/2020   Influenza-Unspecified 09/27/2013, 10/11/2017   PFIZER(Purple Top)SARS-COV-2 Vaccination 03/29/2019, 04/23/2019, 01/01/2020   Pneumococcal Polysaccharide-23 02/28/2004   Td 10/11/2007   Tdap 05/13/2011   Zoster, Unspecified 07/10/2010    Past Medical History:  Diagnosis Date   Arthritis    Asthma    Complication of anesthesia    difficult to  wake up and agitation   Dyslipidemia    Dysrhythmia    h/o atrial couplets with short PAT, h/o a-fib during surgery (event monitor)   GERD (gastroesophageal reflux disease)    History of nuclear stress test 05/11/2009   dipyridamole; normal, low risk    Hypertension    Osteoporosis    Pneumonia    PONV (postoperative nausea and vomiting)    Postoperative atrial fibrillation ([place]) 01/11/2009   Seasonal allergies     Tobacco History: Social History   Tobacco Use  Smoking Status Never  Smokeless Tobacco Never   Counseling given: Not Answered   Continue to not smoke  Outpatient Encounter Medications as of 03/19/2021  Medication Sig  albuterol (VENTOLIN HFA) 108 (90 Base) MCG/ACT inhaler Inhale 1 puff into the lungs every 4 (four) hours as needed for wheezing or shortness of breath.   Cholecalciferol (VITAMIN D) 50 MCG (2000 UT) CAPS Take 1 capsule by mouth daily.   denosumab (PROLIA) 60 MG/ML SOSY injection Inject 60 mg into the skin every 6 (six) months.   diltiazem (CARDIZEM CD) 120 MG 24 hr capsule Take 1 capsule (120 mg total) by mouth daily. (Patient taking differently: Take 120 mg by mouth daily with supper.)   fluticasone (FLONASE) 50 MCG/ACT nasal spray 2 sprays each nostril twice a day for 7 days then 2 sprays each nostril once a day thereafter   levofloxacin (LEVAQUIN) 750 MG tablet Take 1 tablet (750 mg total) by mouth daily.   MAGNESIUM CITRATE PO Take 250 mg by mouth in the morning.   magnesium oxide (MAG-OX) 400 MG tablet Take 400 mg by mouth daily.   metoprolol succinate (TOPROL-XL) 100 MG 24 hr tablet TAKE 1 TABLET(100 MG) BY MOUTH EVERY EVENING   omeprazole (PRILOSEC) 20 MG capsule Take 20 mg by mouth in the morning.   PRALUENT 75 MG/ML SOAJ INJECT 75 MG UNDER THE SKIN EVERY 14 DAYS   rivaroxaban (XARELTO) 20 MG TABS tablet Take 1 tablet (20 mg total) by mouth daily with supper.   spironolactone (ALDACTONE) 50 MG tablet TAKE 1 TABLET BY MOUTH EVERY MORNING    traMADol (ULTRAM) 50 MG tablet Take 1 tablet (50 mg total) by mouth every 6 (six) hours as needed.   [DISCONTINUED] fluticasone (FLONASE) 50 MCG/ACT nasal spray Place 1 spray into both nostrils daily.   Tiotropium Bromide-Olodaterol (STIOLTO RESPIMAT) 2.5-2.5 MCG/ACT AERS Inhale 2 puffs into the lungs daily. (Patient not taking: Reported on 03/19/2021)   No facility-administered encounter medications on file as of 03/19/2021.     Review of Systems  Review of Systems  N/A Physical Exam  BP 134/64 (BP Location: Right Arm, Patient Position: Sitting, Cuff Size: Normal)    Pulse 72    Temp 98.6 F (37 C) (Oral)    Ht 5' (1.524 m)    Wt 170 lb (77.1 kg)    SpO2 97%    BMI 33.20 kg/m   Wt Readings from Last 5 Encounters:  03/19/21 170 lb (77.1 kg)  02/19/21 170 lb 12.8 oz (77.5 kg)  01/01/21 176 lb 6.4 oz (80 kg)  12/25/20 176 lb 6.4 oz (80 kg)  12/18/20 170 lb 3.2 oz (77.2 kg)    BMI Readings from Last 5 Encounters:  03/19/21 33.20 kg/m  02/19/21 33.36 kg/m  01/01/21 34.45 kg/m  12/25/20 34.45 kg/m  12/18/20 33.24 kg/m     Physical Exam General: Well-appearing, no acute distress Eyes: EOMI, icterus Ears: Cloudy fluid behind right tympanic membrane with absence of light reflex, external canal appears within normal limits IgE not elevated 12/22. Neck: Supple no JVP Cardiovascular: Regular rate and rhythm, no murmurs Pulmonary: Clear, normal work of breathing on room air Neuro: Normal gait, no weakness Psych: Normal mood, full affect   Assessment & Plan:   DOE: Likely multifactorial in the setting of known prior asthma, mild weight gain, deconditioning with decreased activity.  Chest x-ray clear.  PFTs normal.  Asthma:  Well-controlled over time.  However, fear that COVID-19 infection 2022 triggered worsening asthma symptoms.  Avoiding Breo given allergy to magnesium stearate.  Advair caused mouth sores.  High-dose Symbicort without significant improvement.  Prescribed  Breztri 11/2020 with improved symptoms however again developed mouth  sores and cannot tolerate twice daily use with worsening symptoms only using daily.  Eosinophils 200 8/22, 2/23. IgE within normal limits 2/23.  Stop Breztri, trial Stiolto with dual bronchodilators.  Insurance is refusing to cover this.  We will continue to appeal.  Samples of Stiolto today.  Ear pain: With what looks like cloudy or purulent middle ear fluid.  Levofloxacin x7 days sent, multiple drug allergies so avoiding tetracyclines, penicillins, sulfa drugs.  Postnasal drip, sinus pressure: Likely in the setting of seasonal allergies.  Flonase 2 sprays twice daily x7 days then 2 sprays daily.  Afrin 2 sprays twice daily x3 days.  Continue Claritin.   Return in about 2 months (around 05/19/2021).   Lanier Clam, MD 03/19/2021

## 2021-03-19 NOTE — Addendum Note (Signed)
Addended by: Retia Passe on: 03/19/2021 04:43 PM ? ? Modules accepted: Orders ? ?

## 2021-03-19 NOTE — Patient Instructions (Addendum)
Nice to see you again ? ?For the nasal congestion, use Flonase 2 sprays each nostril twice a day for 7 days then 2 sprays each nostril once a day thereafter. ? ?In addition, use Afrin 2 sprays twice a day for 3 days then STOP. ? ?Continue the Claritin. ? ?I sent a prescription for levofloxacin 1 pill daily for 7 days to treat what looks like an ear infection in your right ear. ? ?Try Stiolto, samples today.  We will continue to hound the insurance companies to try to approve this. ? ?Return to clinic in 2 months or sooner as needed with Dr. Silas Flood ?

## 2021-03-20 ENCOUNTER — Telehealth: Payer: Self-pay | Admitting: Pulmonary Disease

## 2021-03-20 NOTE — Telephone Encounter (Signed)
Called patient and got verbal confirmation from Dr Silas Flood that she is able to use this inhaler because she cant tolerate any other inhaler. Patient verbalized understanding. Nothing further.  ?

## 2021-03-27 ENCOUNTER — Telehealth: Payer: Self-pay | Admitting: Pulmonary Disease

## 2021-03-27 NOTE — Telephone Encounter (Signed)
Called patient and notified her that as long as she doesn't have mouth sores and her voice is good then to continue the Symbicort. Nothing further needed  ?

## 2021-03-27 NOTE — Telephone Encounter (Signed)
Voice better? No mouth sores? If so the Symbicort is fine to continue.

## 2021-03-27 NOTE — Telephone Encounter (Signed)
Patient is taking her Symbicort 160 mcg 2 puffs twice a day and she states so far she is not having any issues. She states that has not started the sample medication that [place] gave her of the Stiolto so far.  ? ?[place] do you want her to just stay on the Symbicort?? ? ?Please advise MH  ?

## 2021-03-30 DIAGNOSIS — Z0000 Encounter for general adult medical examination without abnormal findings: Secondary | ICD-10-CM | POA: Diagnosis not present

## 2021-03-30 DIAGNOSIS — R300 Dysuria: Secondary | ICD-10-CM | POA: Diagnosis not present

## 2021-03-30 DIAGNOSIS — E559 Vitamin D deficiency, unspecified: Secondary | ICD-10-CM | POA: Diagnosis not present

## 2021-03-30 DIAGNOSIS — Z79899 Other long term (current) drug therapy: Secondary | ICD-10-CM | POA: Diagnosis not present

## 2021-03-30 DIAGNOSIS — E789 Disorder of lipoprotein metabolism, unspecified: Secondary | ICD-10-CM | POA: Diagnosis not present

## 2021-03-31 DIAGNOSIS — H2513 Age-related nuclear cataract, bilateral: Secondary | ICD-10-CM | POA: Diagnosis not present

## 2021-04-01 DIAGNOSIS — R300 Dysuria: Secondary | ICD-10-CM | POA: Diagnosis not present

## 2021-04-01 DIAGNOSIS — Z0000 Encounter for general adult medical examination without abnormal findings: Secondary | ICD-10-CM | POA: Diagnosis not present

## 2021-04-02 DIAGNOSIS — I10 Essential (primary) hypertension: Secondary | ICD-10-CM | POA: Diagnosis not present

## 2021-04-02 DIAGNOSIS — E559 Vitamin D deficiency, unspecified: Secondary | ICD-10-CM | POA: Diagnosis not present

## 2021-04-02 DIAGNOSIS — I480 Paroxysmal atrial fibrillation: Secondary | ICD-10-CM | POA: Diagnosis not present

## 2021-04-02 DIAGNOSIS — Z0000 Encounter for general adult medical examination without abnormal findings: Secondary | ICD-10-CM | POA: Diagnosis not present

## 2021-04-02 DIAGNOSIS — Z789 Other specified health status: Secondary | ICD-10-CM | POA: Diagnosis not present

## 2021-04-02 DIAGNOSIS — J45909 Unspecified asthma, uncomplicated: Secondary | ICD-10-CM | POA: Diagnosis not present

## 2021-04-02 DIAGNOSIS — Z23 Encounter for immunization: Secondary | ICD-10-CM | POA: Diagnosis not present

## 2021-04-02 DIAGNOSIS — M810 Age-related osteoporosis without current pathological fracture: Secondary | ICD-10-CM | POA: Diagnosis not present

## 2021-04-02 DIAGNOSIS — E7800 Pure hypercholesterolemia, unspecified: Secondary | ICD-10-CM | POA: Diagnosis not present

## 2021-04-13 ENCOUNTER — Emergency Department (HOSPITAL_BASED_OUTPATIENT_CLINIC_OR_DEPARTMENT_OTHER): Payer: Medicare Other

## 2021-04-13 ENCOUNTER — Other Ambulatory Visit: Payer: Self-pay

## 2021-04-13 ENCOUNTER — Encounter (HOSPITAL_BASED_OUTPATIENT_CLINIC_OR_DEPARTMENT_OTHER): Payer: Self-pay | Admitting: Emergency Medicine

## 2021-04-13 ENCOUNTER — Inpatient Hospital Stay (HOSPITAL_BASED_OUTPATIENT_CLINIC_OR_DEPARTMENT_OTHER)
Admission: EM | Admit: 2021-04-13 | Discharge: 2021-04-19 | DRG: 373 | Disposition: A | Discharge status: Home / Self Care | Payer: Medicare Other | Attending: Internal Medicine | Admitting: Internal Medicine

## 2021-04-13 DIAGNOSIS — Z882 Allergy status to sulfonamides status: Secondary | ICD-10-CM | POA: Diagnosis not present

## 2021-04-13 DIAGNOSIS — J449 Chronic obstructive pulmonary disease, unspecified: Secondary | ICD-10-CM | POA: Diagnosis present

## 2021-04-13 DIAGNOSIS — R350 Frequency of micturition: Secondary | ICD-10-CM | POA: Diagnosis present

## 2021-04-13 DIAGNOSIS — J302 Other seasonal allergic rhinitis: Secondary | ICD-10-CM

## 2021-04-13 DIAGNOSIS — Z79899 Other long term (current) drug therapy: Secondary | ICD-10-CM

## 2021-04-13 DIAGNOSIS — R000 Tachycardia, unspecified: Secondary | ICD-10-CM | POA: Diagnosis not present

## 2021-04-13 DIAGNOSIS — Z7901 Long term (current) use of anticoagulants: Secondary | ICD-10-CM

## 2021-04-13 DIAGNOSIS — K5730 Diverticulosis of large intestine without perforation or abscess without bleeding: Secondary | ICD-10-CM | POA: Diagnosis not present

## 2021-04-13 DIAGNOSIS — I480 Paroxysmal atrial fibrillation: Secondary | ICD-10-CM | POA: Diagnosis present

## 2021-04-13 DIAGNOSIS — E860 Dehydration: Secondary | ICD-10-CM | POA: Diagnosis not present

## 2021-04-13 DIAGNOSIS — R112 Nausea with vomiting, unspecified: Secondary | ICD-10-CM | POA: Diagnosis not present

## 2021-04-13 DIAGNOSIS — E876 Hypokalemia: Secondary | ICD-10-CM | POA: Diagnosis not present

## 2021-04-13 DIAGNOSIS — Z8701 Personal history of pneumonia (recurrent): Secondary | ICD-10-CM | POA: Diagnosis not present

## 2021-04-13 DIAGNOSIS — Z888 Allergy status to other drugs, medicaments and biological substances status: Secondary | ICD-10-CM

## 2021-04-13 DIAGNOSIS — Z885 Allergy status to narcotic agent status: Secondary | ICD-10-CM

## 2021-04-13 DIAGNOSIS — Z20822 Contact with and (suspected) exposure to covid-19: Secondary | ICD-10-CM | POA: Diagnosis not present

## 2021-04-13 DIAGNOSIS — K529 Noninfective gastroenteritis and colitis, unspecified: Secondary | ICD-10-CM | POA: Diagnosis not present

## 2021-04-13 DIAGNOSIS — A020 Salmonella enteritis: Secondary | ICD-10-CM | POA: Diagnosis not present

## 2021-04-13 DIAGNOSIS — I10 Essential (primary) hypertension: Secondary | ICD-10-CM | POA: Diagnosis present

## 2021-04-13 DIAGNOSIS — K769 Liver disease, unspecified: Secondary | ICD-10-CM | POA: Diagnosis present

## 2021-04-13 DIAGNOSIS — J4540 Moderate persistent asthma, uncomplicated: Secondary | ICD-10-CM

## 2021-04-13 DIAGNOSIS — K6389 Other specified diseases of intestine: Secondary | ICD-10-CM | POA: Diagnosis not present

## 2021-04-13 DIAGNOSIS — M810 Age-related osteoporosis without current pathological fracture: Secondary | ICD-10-CM | POA: Diagnosis present

## 2021-04-13 DIAGNOSIS — K449 Diaphragmatic hernia without obstruction or gangrene: Secondary | ICD-10-CM | POA: Diagnosis not present

## 2021-04-13 DIAGNOSIS — Z91048 Other nonmedicinal substance allergy status: Secondary | ICD-10-CM

## 2021-04-13 DIAGNOSIS — R32 Unspecified urinary incontinence: Secondary | ICD-10-CM | POA: Diagnosis present

## 2021-04-13 DIAGNOSIS — Z880 Allergy status to penicillin: Secondary | ICD-10-CM

## 2021-04-13 DIAGNOSIS — Z9049 Acquired absence of other specified parts of digestive tract: Secondary | ICD-10-CM

## 2021-04-13 DIAGNOSIS — K219 Gastro-esophageal reflux disease without esophagitis: Secondary | ICD-10-CM | POA: Diagnosis present

## 2021-04-13 DIAGNOSIS — N3941 Urge incontinence: Secondary | ICD-10-CM | POA: Diagnosis present

## 2021-04-13 DIAGNOSIS — E785 Hyperlipidemia, unspecified: Secondary | ICD-10-CM | POA: Diagnosis not present

## 2021-04-13 DIAGNOSIS — Z881 Allergy status to other antibiotic agents status: Secondary | ICD-10-CM

## 2021-04-13 DIAGNOSIS — R197 Diarrhea, unspecified: Secondary | ICD-10-CM | POA: Diagnosis not present

## 2021-04-13 DIAGNOSIS — M1990 Unspecified osteoarthritis, unspecified site: Secondary | ICD-10-CM | POA: Diagnosis present

## 2021-04-13 DIAGNOSIS — I959 Hypotension, unspecified: Secondary | ICD-10-CM | POA: Diagnosis not present

## 2021-04-13 DIAGNOSIS — R110 Nausea: Secondary | ICD-10-CM | POA: Diagnosis not present

## 2021-04-13 DIAGNOSIS — R1111 Vomiting without nausea: Secondary | ICD-10-CM | POA: Diagnosis not present

## 2021-04-13 LAB — URINALYSIS, ROUTINE W REFLEX MICROSCOPIC
Bilirubin Urine: NEGATIVE
Glucose, UA: NEGATIVE mg/dL
Hgb urine dipstick: NEGATIVE
Ketones, ur: 15 mg/dL — AB
Leukocytes,Ua: NEGATIVE
Nitrite: NEGATIVE
Specific Gravity, Urine: >1.046 — ABNORMAL HIGH (ref 1.005–1.030)
pH: 6 (ref 5.0–8.0)

## 2021-04-13 LAB — LACTIC ACID, PLASMA: Lactic Acid, Venous: 1.3 mmol/L (ref 0.5–1.9)

## 2021-04-13 LAB — PROTIME-INR
INR: 1.4 — ABNORMAL HIGH (ref 0.8–1.2)
Prothrombin Time: 17.5 seconds — ABNORMAL HIGH (ref 11.4–15.2)

## 2021-04-13 LAB — COMPREHENSIVE METABOLIC PANEL
ALT: 30 U/L (ref 0–44)
AST: 48 U/L — ABNORMAL HIGH (ref 15–41)
Albumin: 4.5 g/dL (ref 3.5–5.0)
Alkaline Phosphatase: 56 U/L (ref 38–126)
Anion gap: 13 (ref 5–15)
BUN: 31 mg/dL — ABNORMAL HIGH (ref 8–23)
CO2: 18 mmol/L — ABNORMAL LOW (ref 22–32)
Calcium: 9.4 mg/dL (ref 8.9–10.3)
Chloride: 102 mmol/L (ref 98–111)
Creatinine, Ser: 0.97 mg/dL (ref 0.44–1.00)
GFR, Estimated: 57 mL/min — ABNORMAL LOW (ref >60)
Glucose, Bld: 116 mg/dL — ABNORMAL HIGH (ref 70–99)
Potassium: 4.4 mmol/L (ref 3.5–5.1)
Sodium: 133 mmol/L — ABNORMAL LOW (ref 135–145)
Total Bilirubin: 0.9 mg/dL (ref 0.3–1.2)
Total Protein: 7.1 g/dL (ref 6.5–8.1)

## 2021-04-13 LAB — CBC
HCT: 45.4 % (ref 36.0–46.0)
Hemoglobin: 15.1 g/dL — ABNORMAL HIGH (ref 12.0–15.0)
MCH: 30.9 pg (ref 26.0–34.0)
MCHC: 33.3 g/dL (ref 30.0–36.0)
MCV: 92.8 fL (ref 80.0–100.0)
Platelets: 199 10*3/uL (ref 150–400)
RBC: 4.89 MIL/uL (ref 3.87–5.11)
RDW: 13.2 % (ref 11.5–15.5)
WBC: 10.8 10*3/uL — ABNORMAL HIGH (ref 4.0–10.5)
nRBC: 0 % (ref 0.0–0.2)

## 2021-04-13 LAB — RESP PANEL BY RT-PCR (FLU A&B, COVID) ARPGX2
Influenza A by PCR: NEGATIVE
Influenza B by PCR: NEGATIVE
SARS Coronavirus 2 by RT PCR: NEGATIVE

## 2021-04-13 LAB — LIPASE, BLOOD: Lipase: 18 U/L (ref 11–51)

## 2021-04-13 LAB — APTT: aPTT: 35 seconds (ref 24–36)

## 2021-04-13 MED ORDER — LOPERAMIDE HCL 2 MG PO CAPS
2.0000 mg | ORAL_CAPSULE | Freq: Once | ORAL | Status: AC
  Administered 2021-04-13: 2 mg via ORAL
  Filled 2021-04-13: qty 1

## 2021-04-13 MED ORDER — ACETAMINOPHEN 500 MG PO TABS
1000.0000 mg | ORAL_TABLET | Freq: Once | ORAL | Status: AC
  Administered 2021-04-13: 1000 mg via ORAL
  Filled 2021-04-13: qty 2

## 2021-04-13 MED ORDER — IOHEXOL 300 MG/ML  SOLN
100.0000 mL | Freq: Once | INTRAMUSCULAR | Status: AC | PRN
  Administered 2021-04-13: 100 mL via INTRAVENOUS

## 2021-04-13 MED ORDER — SODIUM CHLORIDE 0.9 % IV BOLUS (SEPSIS)
2370.0000 mL | Freq: Once | INTRAVENOUS | Status: AC
Start: 2021-04-13 — End: 2021-04-13
  Administered 2021-04-13: 2370 mL via INTRAVENOUS

## 2021-04-13 MED ORDER — ONDANSETRON HCL 4 MG/2ML IJ SOLN
4.0000 mg | Freq: Once | INTRAMUSCULAR | Status: AC
  Administered 2021-04-13: 4 mg via INTRAVENOUS
  Filled 2021-04-13: qty 2

## 2021-04-13 NOTE — ED Triage Notes (Signed)
Pt arrives to ED via [facility] with c/o fever, nausea, vomiting, diarrhea that started at 1am this morning. Pt denies abdominal pain. Associated symptoms include urinary frequency. Pt reports taking Imodium this afternoon.  ?

## 2021-04-13 NOTE — ED Notes (Signed)
Pt placed on 2L [place] r/t oxygen saturation dropping to 87% while sleeping. Provider notified. ?

## 2021-04-13 NOTE — ED Provider Notes (Signed)
?[facility] ?Provider Note ? ? ?CSN: [id number] ?Arrival date & time: 04/13/21  1747 ? ?  ? ?History ? ?Chief Complaint  ?Patient presents with  ? Emesis  ? Diarrhea  ? ? ?Deborah Mcmillan is a 87 y.o. female with a past medical history significant for hypertension, acid reflux, asthma, osteoporosis, paroxysmal A-fib, with previous surgical history of appendectomy, cholecystectomy who presents with concern for fever, nausea, vomiting, diarrhea that began at 1 AM this morning.  She denies any significant abdominal pain.  She reports that she has had frequent diarrhea with frequent urination.  She denies any hematuria, dysuria.  She denies any vaginal discharge or dyspareunia.  She denies any chest pain, shortness of breath. ? ? ?Emesis ?Associated symptoms: abdominal pain and diarrhea   ?Diarrhea ?Associated symptoms: abdominal pain and vomiting   ? ?  ? ?Home Medications ?Prior to Admission medications   ?Medication Sig Start Date End Date Taking? Authorizing Provider  ?albuterol (VENTOLIN HFA) 108 (90 Base) MCG/ACT inhaler Inhale 1 puff into the lungs every 4 (four) hours as needed for wheezing or shortness of breath. 09/25/20   Hunsucker, Bonna Gains, MD  ?Cholecalciferol (VITAMIN D) 50 MCG (2000 UT) CAPS Take 1 capsule by mouth daily.    [provider]  ?denosumab (PROLIA) 60 MG/ML SOSY injection Inject 60 mg into the skin every 6 (six) months.    [provider]  ?diltiazem (CARDIZEM CD) 120 MG 24 hr capsule Take 1 capsule (120 mg total) by mouth daily. ?Patient taking differently: Take 120 mg by mouth daily with supper. 08/18/20   Adrian Prows, MD  ?fluticasone Asencion Islam) 50 MCG/ACT nasal spray 2 sprays each nostril twice a day for 7 days then 2 sprays each nostril once a day thereafter 03/19/21   Hunsucker, Bonna Gains, MD  ?levofloxacin (LEVAQUIN) 750 MG tablet Take 1 tablet (750 mg total) by mouth daily. 03/19/21   Hunsucker, Bonna Gains, MD  ?MAGNESIUM CITRATE PO Take 250 mg by  mouth in the morning.    [provider]  ?magnesium oxide (MAG-OX) 400 MG tablet Take 400 mg by mouth daily.    [provider]  ?metoprolol succinate (TOPROL-XL) 100 MG 24 hr tablet TAKE 1 TABLET(100 MG) BY MOUTH EVERY EVENING 03/12/21   Adrian Prows, MD  ?omeprazole (PRILOSEC) 20 MG capsule Take 20 mg by mouth in the morning.    [provider]  ?PRALUENT 75 MG/ML SOAJ INJECT 75 MG UNDER THE SKIN EVERY 14 DAYS 07/11/20   Adrian Prows, MD  ?rivaroxaban (XARELTO) 20 MG TABS tablet Take 1 tablet (20 mg total) by mouth daily with supper. 02/16/21   Adrian Prows, MD  ?spironolactone (ALDACTONE) 50 MG tablet TAKE 1 TABLET BY MOUTH EVERY MORNING 09/12/20   Adrian Prows, MD  ?Tiotropium Bromide-Olodaterol (STIOLTO RESPIMAT) 2.5-2.5 MCG/ACT AERS Inhale 2 puffs into the lungs daily. ?Patient not taking: Reported on 03/19/2021 02/19/21   Hunsucker, Bonna Gains, MD  ?Tiotropium Bromide-Olodaterol (STIOLTO RESPIMAT) 2.5-2.5 MCG/ACT AERS Inhale 2 puffs into the lungs daily. 03/19/21   Hunsucker, Bonna Gains, MD  ?traMADol (ULTRAM) 50 MG tablet Take 1 tablet (50 mg total) by mouth every 6 (six) hours as needed. 01/01/21 01/01/22  Clovis Riley, MD  ?   ? ?Allergies    ?Calcium-containing compounds, Magnesium-containing compounds, Tape, Nitrofurantoin, Welchol [colesevelam hcl], Cefuroxime axetil, Codeine, Crestor [rosuvastatin], Lodine [etodolac], Niacin and related, Penicillins, Reclast [zoledronic acid], Sulfa antibiotics, Tetracyclines & related, Welchol [colesevelam], and Zetia [ezetimibe]   ? ?  Review of Systems   ?Review of Systems  ?Constitutional:  Positive for fatigue.  ?Gastrointestinal:  Positive for abdominal pain, diarrhea, nausea and vomiting.  ?All other systems reviewed and are negative. ? ?Physical Exam ?Updated Vital Signs ?BP (!) 126/50   Pulse 86   Temp 99.7 ?F (37.6 ?C) (Oral)   Resp 18   Ht 5' (1.524 m)   Wt 78.9 kg   SpO2 99%   BMI 33.98 kg/m?  ?Physical Exam ?Vitals and nursing note reviewed.   ?Constitutional:   ?   General: She is not in acute distress. ?   Appearance: Normal appearance. She is ill-appearing and diaphoretic.  ?HENT:  ?   Head: Normocephalic and atraumatic.  ?   Mouth/Throat:  ?   Mouth: Mucous membranes are dry.  ?Eyes:  ?   General:     ?   Right eye: No discharge.     ?   Left eye: No discharge.  ?Cardiovascular:  ?   Rate and Rhythm: Regular rhythm. Tachycardia present.  ?   Heart sounds: No murmur heard. ?  No friction rub. No gallop.  ?Pulmonary:  ?   Effort: Pulmonary effort is normal.  ?   Breath sounds: Normal breath sounds.  ?Abdominal:  ?   Comments: Patient with significant tenderness to palpation of the lower abdomen, especially in the right lower quadrant.  Some guarding, no rebound, rigidity.  She does have some hyperactive bowel sounds.  No evidence of borborygmi, or tinkling bowel sounds.  Minimal concern for bowel obstruction.  Her pain does not seem out of proportion to exam, low clinical suspicion for acute mesenteric ischemia at this time.  ?Skin: ?   General: Skin is warm.  ?   Capillary Refill: Capillary refill takes less than 2 seconds.  ?   Comments: Patient with decreased turgor, pallor  ?Neurological:  ?   Mental Status: She is alert and oriented to person, place, and time.  ?Psychiatric:     ?   Mood and Affect: Mood normal.     ?   Behavior: Behavior normal.  ? ? ?ED Results / Procedures / Treatments   ?Labs ?(all labs ordered are listed, but only abnormal results are displayed) ?Labs Reviewed  ?COMPREHENSIVE METABOLIC PANEL - Abnormal; Notable for the following components:  ?    Result Value  ? Sodium 133 (*)   ? CO2 18 (*)   ? Glucose, Bld 116 (*)   ? BUN 31 (*)   ? AST 48 (*)   ? GFR, Estimated 57 (*)   ? All other components within normal limits  ?CBC - Abnormal; Notable for the following components:  ? WBC 10.8 (*)   ? Hemoglobin 15.1 (*)   ? All other components within normal limits  ?PROTIME-INR - Abnormal; Notable for the following components:  ?  Prothrombin Time 17.5 (*)   ? INR 1.4 (*)   ? All other components within normal limits  ?URINALYSIS, ROUTINE W REFLEX MICROSCOPIC - Abnormal; Notable for the following components:  ? Color, Urine COLORLESS (*)   ? Specific Gravity, Urine >1.046 (*)   ? Ketones, ur 15 (*)   ? Protein, ur TRACE (*)   ? All other components within normal limits  ?RESP PANEL BY RT-PCR (FLU A&B, COVID) [id number]  ?CULTURE, BLOOD (ROUTINE X 2)  ?CULTURE, BLOOD (ROUTINE X 2)  ?URINE CULTURE  ?GASTROINTESTINAL PANEL BY PCR, STOOL (REPLACES STOOL CULTURE)  ?LIPASE, BLOOD  ?LACTIC ACID, PLASMA  ?  APTT  ? ? ?EKG ?EKG Interpretation ? ?Date/Time:  Monday April 13 2021 17:55:07 EDT ?Ventricular Rate:  113 ?PR Interval:  161 ?QRS Duration: 88 ?QT Interval:  302 ?QTC Calculation: 414 ?R Axis:   48 ?Text Interpretation: Sinus tachycardia Low voltage, precordial leads SINCE LAST TRACING HEART RATE HAS INCREASED Confirmed by Malvin Johns [phone]) on 04/13/2021 7:15:34 PM ? ?Radiology ?CT ABDOMEN PELVIS W CONTRAST ? ?Result Date: 04/13/2021 ?CLINICAL DATA:  Pain right lower quadrant EXAM: CT ABDOMEN AND PELVIS WITH CONTRAST TECHNIQUE: Multidetector CT imaging of the abdomen and pelvis was performed using the standard protocol following bolus administration of intravenous contrast. RADIATION DOSE REDUCTION: This exam was performed according to the departmental dose-optimization program which includes automated exposure control, adjustment of the mA and/or kV according to patient size and/or use of iterative reconstruction technique. CONTRAST:  175m OMNIPAQUE IOHEXOL 300 MG/ML  SOLN COMPARISON:  None. FINDINGS: Lower chest: There is no focal consolidation in the lower lung fields. Hepatobiliary: There is 6 mm low-density in the right lobe in image 13, possibly a cyst or hemangioma. There is no dilation of bile ducts. Surgical clips are seen in gallbladder fossa. Pancreas: No focal abnormality is seen. Spleen: Unremarkable. Adrenals/Urinary Tract: Adrenals  are unremarkable. There is no hydronephrosis. Prominence of right renal pelvis may be due to extrarenal location or mild ureteropelvic junction obstruction. AP diameter of right renal pelvis measures 2.1 cm. There

## 2021-04-14 DIAGNOSIS — E876 Hypokalemia: Secondary | ICD-10-CM | POA: Diagnosis not present

## 2021-04-14 DIAGNOSIS — Z79899 Other long term (current) drug therapy: Secondary | ICD-10-CM | POA: Diagnosis not present

## 2021-04-14 DIAGNOSIS — Z882 Allergy status to sulfonamides status: Secondary | ICD-10-CM | POA: Diagnosis not present

## 2021-04-14 DIAGNOSIS — R32 Unspecified urinary incontinence: Secondary | ICD-10-CM | POA: Diagnosis not present

## 2021-04-14 DIAGNOSIS — Z880 Allergy status to penicillin: Secondary | ICD-10-CM | POA: Diagnosis not present

## 2021-04-14 DIAGNOSIS — I10 Essential (primary) hypertension: Secondary | ICD-10-CM

## 2021-04-14 DIAGNOSIS — M1990 Unspecified osteoarthritis, unspecified site: Secondary | ICD-10-CM | POA: Diagnosis not present

## 2021-04-14 DIAGNOSIS — Z8701 Personal history of pneumonia (recurrent): Secondary | ICD-10-CM | POA: Diagnosis not present

## 2021-04-14 DIAGNOSIS — Z885 Allergy status to narcotic agent status: Secondary | ICD-10-CM | POA: Diagnosis not present

## 2021-04-14 DIAGNOSIS — I480 Paroxysmal atrial fibrillation: Secondary | ICD-10-CM

## 2021-04-14 DIAGNOSIS — K529 Noninfective gastroenteritis and colitis, unspecified: Secondary | ICD-10-CM | POA: Diagnosis not present

## 2021-04-14 DIAGNOSIS — R112 Nausea with vomiting, unspecified: Secondary | ICD-10-CM

## 2021-04-14 DIAGNOSIS — M810 Age-related osteoporosis without current pathological fracture: Secondary | ICD-10-CM | POA: Diagnosis not present

## 2021-04-14 DIAGNOSIS — Z7901 Long term (current) use of anticoagulants: Secondary | ICD-10-CM | POA: Diagnosis not present

## 2021-04-14 DIAGNOSIS — Z20822 Contact with and (suspected) exposure to covid-19: Secondary | ICD-10-CM | POA: Diagnosis not present

## 2021-04-14 DIAGNOSIS — K219 Gastro-esophageal reflux disease without esophagitis: Secondary | ICD-10-CM | POA: Diagnosis not present

## 2021-04-14 DIAGNOSIS — K769 Liver disease, unspecified: Secondary | ICD-10-CM | POA: Diagnosis not present

## 2021-04-14 DIAGNOSIS — J449 Chronic obstructive pulmonary disease, unspecified: Secondary | ICD-10-CM | POA: Diagnosis not present

## 2021-04-14 DIAGNOSIS — Z9049 Acquired absence of other specified parts of digestive tract: Secondary | ICD-10-CM | POA: Diagnosis not present

## 2021-04-14 DIAGNOSIS — E785 Hyperlipidemia, unspecified: Secondary | ICD-10-CM | POA: Diagnosis not present

## 2021-04-14 DIAGNOSIS — A020 Salmonella enteritis: Secondary | ICD-10-CM | POA: Diagnosis not present

## 2021-04-14 DIAGNOSIS — Z881 Allergy status to other antibiotic agents status: Secondary | ICD-10-CM | POA: Diagnosis not present

## 2021-04-14 DIAGNOSIS — N3941 Urge incontinence: Secondary | ICD-10-CM | POA: Diagnosis not present

## 2021-04-14 DIAGNOSIS — I959 Hypotension, unspecified: Secondary | ICD-10-CM | POA: Diagnosis not present

## 2021-04-14 DIAGNOSIS — E860 Dehydration: Secondary | ICD-10-CM | POA: Diagnosis not present

## 2021-04-14 DIAGNOSIS — R350 Frequency of micturition: Secondary | ICD-10-CM | POA: Diagnosis not present

## 2021-04-14 LAB — BASIC METABOLIC PANEL
Anion gap: 9 (ref 5–15)
BUN: 24 mg/dL — ABNORMAL HIGH (ref 8–23)
CO2: 18 mmol/L — ABNORMAL LOW (ref 22–32)
Calcium: 7.9 mg/dL — ABNORMAL LOW (ref 8.9–10.3)
Chloride: 109 mmol/L (ref 98–111)
Creatinine, Ser: 0.86 mg/dL (ref 0.44–1.00)
GFR, Estimated: >60 mL/min (ref >60)
Glucose, Bld: 108 mg/dL — ABNORMAL HIGH (ref 70–99)
Potassium: 4 mmol/L (ref 3.5–5.1)
Sodium: 136 mmol/L (ref 135–145)

## 2021-04-14 LAB — GASTROINTESTINAL PANEL BY PCR, STOOL (REPLACES STOOL CULTURE)

## 2021-04-14 LAB — CBC
HCT: 40.8 % (ref 36.0–46.0)
Hemoglobin: 14 g/dL (ref 12.0–15.0)
MCH: 32.3 pg (ref 26.0–34.0)
MCHC: 34.3 g/dL (ref 30.0–36.0)
MCV: 94.2 fL (ref 80.0–100.0)
Platelets: 176 10*3/uL (ref 150–400)
RBC: 4.33 MIL/uL (ref 3.87–5.11)
RDW: 13.4 % (ref 11.5–15.5)
WBC: 8.6 10*3/uL (ref 4.0–10.5)
nRBC: 0 % (ref 0.0–0.2)

## 2021-04-14 LAB — C DIFFICILE QUICK SCREEN W PCR REFLEX
C Diff antigen: NEGATIVE
C Diff interpretation: NOT DETECTED
C Diff toxin: NEGATIVE

## 2021-04-14 LAB — PROCALCITONIN: Procalcitonin: <0.1 ng/mL

## 2021-04-14 MED ORDER — TRAMADOL HCL 50 MG PO TABS
50.0000 mg | ORAL_TABLET | Freq: Four times a day (QID) | ORAL | Status: DC | PRN
  Filled 2021-04-14: qty 1

## 2021-04-14 MED ORDER — ALBUTEROL SULFATE HFA 108 (90 BASE) MCG/ACT IN AERS: 1.0000 | INHALATION_SPRAY | RESPIRATORY_TRACT | Status: DC | PRN

## 2021-04-14 MED ORDER — ONDANSETRON HCL 4 MG PO TABS
4.0000 mg | ORAL_TABLET | Freq: Four times a day (QID) | ORAL | Status: DC | PRN
  Administered 2021-04-17: 4 mg via ORAL
  Filled 2021-04-14: qty 1

## 2021-04-14 MED ORDER — METOPROLOL SUCCINATE ER 100 MG PO TB24
100.0000 mg | ORAL_TABLET | Freq: Every day | ORAL | Status: DC
  Administered 2021-04-14 – 2021-04-18 (×5): 100 mg via ORAL
  Filled 2021-04-14 (×5): qty 1

## 2021-04-14 MED ORDER — PANTOPRAZOLE SODIUM 40 MG PO TBEC
40.0000 mg | DELAYED_RELEASE_TABLET | Freq: Every day | ORAL | Status: DC
Start: 2021-04-14 — End: 2021-04-19
  Administered 2021-04-14 – 2021-04-19 (×6): 40 mg via ORAL
  Filled 2021-04-14 (×6): qty 1

## 2021-04-14 MED ORDER — LOPERAMIDE HCL 2 MG PO CAPS
2.0000 mg | ORAL_CAPSULE | Freq: Once | ORAL | Status: AC
Start: 2021-04-14 — End: 2021-04-14
  Administered 2021-04-14: 2 mg via ORAL
  Filled 2021-04-14: qty 1

## 2021-04-14 MED ORDER — ALBUTEROL SULFATE (2.5 MG/3ML) 0.083% IN NEBU: 2.5000 mg | INHALATION_SOLUTION | RESPIRATORY_TRACT | Status: DC | PRN

## 2021-04-14 MED ORDER — ARFORMOTEROL TARTRATE 15 MCG/2ML IN NEBU
15.0000 ug | INHALATION_SOLUTION | Freq: Two times a day (BID) | RESPIRATORY_TRACT | Status: DC
Start: 2021-04-14 — End: 2021-04-19
  Administered 2021-04-14 – 2021-04-19 (×9): 15 ug via RESPIRATORY_TRACT
  Filled 2021-04-14 (×11): qty 2

## 2021-04-14 MED ORDER — ACETAMINOPHEN 325 MG PO TABS
650.0000 mg | ORAL_TABLET | Freq: Four times a day (QID) | ORAL | Status: DC | PRN
  Administered 2021-04-14 – 2021-04-15 (×5): 650 mg via ORAL
  Filled 2021-04-14 (×5): qty 2

## 2021-04-14 MED ORDER — SACCHAROMYCES BOULARDII 250 MG PO CAPS
250.0000 mg | ORAL_CAPSULE | Freq: Two times a day (BID) | ORAL | Status: DC
  Administered 2021-04-14 – 2021-04-19 (×10): 250 mg via ORAL
  Filled 2021-04-14 (×10): qty 1

## 2021-04-14 MED ORDER — UMECLIDINIUM BROMIDE 62.5 MCG/ACT IN AEPB
1.0000 | INHALATION_SPRAY | Freq: Every day | RESPIRATORY_TRACT | Status: DC
  Administered 2021-04-14 – 2021-04-19 (×5): 1 via RESPIRATORY_TRACT
  Filled 2021-04-14: qty 7

## 2021-04-14 MED ORDER — ACETAMINOPHEN 650 MG RE SUPP: 650.0000 mg | Freq: Four times a day (QID) | RECTAL | Status: DC | PRN

## 2021-04-14 MED ORDER — RIVAROXABAN 20 MG PO TABS
20.0000 mg | ORAL_TABLET | Freq: Every day | ORAL | Status: DC
  Administered 2021-04-14 – 2021-04-18 (×5): 20 mg via ORAL
  Filled 2021-04-14 (×5): qty 1

## 2021-04-14 MED ORDER — ONDANSETRON HCL 4 MG/2ML IJ SOLN
4.0000 mg | Freq: Four times a day (QID) | INTRAMUSCULAR | Status: DC | PRN
Start: 2021-04-14 — End: 2021-04-19
  Administered 2021-04-14 (×2): 4 mg via INTRAVENOUS
  Filled 2021-04-14 (×2): qty 2

## 2021-04-14 MED ORDER — LACTATED RINGERS IV SOLN: INTRAVENOUS | Status: DC

## 2021-04-14 NOTE — Progress Notes (Signed)
? ?  Patient seen and examined at bedside, patient admitted after midnight, please see earlier detailed admission note by Jennette Kettle, MD. Briefly, patient presented secondary to emesis and diarrhea with evidence of enterocolitis on CT imaging. Stool test significant for salmonella. ? ?Subjective: ?Continued diarrhea with associated incontinence. Feels weak. ? ?BP (!) 158/50 (BP Location: Right Arm)   Pulse (!) 56   Temp 98.3 ?F (36.8 ?C) (Oral)   Resp 17   Ht 5' (1.524 m)   Wt 77.5 kg   SpO2 96%   BMI 33.37 kg/m?  ? ?General exam: Appears calm and comfortable but ill/fatigued appearing. ?Respiratory system: Clear to auscultation. Respiratory effort normal. ?Cardiovascular system: S1 & S2 heard, RRR. No murmurs, rubs, gallops or clicks. ?Gastrointestinal system: Abdomen is nondistended, soft and nontender. No organomegaly or masses felt. Normal bowel sounds heard. ?Central nervous system: Alert and oriented. No focal neurological deficits. ?Musculoskeletal: No edema. No calf tenderness ?Skin: No cyanosis. No rashes ?Psychiatry: Judgement and insight appear normal. Mood & affect appropriate.  ? ?Brief assessment/Plan: ? ?Infectious enterocolitis ?Salmonella infection ?Continued diarrhea. Negative procalcitonin. Tmax of 101.9 ?F over the last 24 hours. C. Difficile negative with positive salmonella on GI pathogen panel. Started on IV fluids for supportive care. ?-Continue IV fluids, decrease to 100 ml/hr ?-Watch for slowing of bowel movements ? ?Rest of plan per H&P ? ?Family communication: Son at bedside ?DVT prophylaxis: Xarelto ?Disposition: Discharge home in 1-2 days ? ?Cordelia Poche, MD ?[facility] ?04/14/2021, 10:29 AM ?

## 2021-04-14 NOTE — Plan of Care (Signed)
?  Problem: Education: ?Goal: Knowledge of General Education information will improve ?Description: Including pain rating scale, medication(s)/side effects and non-pharmacologic comfort measures ?Outcome: Progressing ?  ?Problem: Health Behavior/Discharge Planning: ?Goal: Ability to manage health-related needs will improve ?Outcome: Progressing ?  ?Problem: Clinical Measurements: ?Goal: Ability to maintain clinical measurements within normal limits will improve ?Outcome: Progressing ?Goal: Will remain free from infection ?Outcome: Progressing ?Goal: Diagnostic test results will improve ?Outcome: Progressing ?Goal: Respiratory complications will improve ?Outcome: Progressing ?Goal: Cardiovascular complication will be avoided ?Outcome: Progressing ?  ?Problem: Elimination: ?Goal: Will not experience complications related to bowel motility ?Outcome: Progressing ?Goal: Will not experience complications related to urinary retention ?Outcome: Progressing ?  ?Problem: Pain Managment: ?Goal: General experience of comfort will improve ?Outcome: Progressing ?  ?Problem: Nutrition ?Goal: Patient maintains adequate hydration ?Outcome: Progressing ?  ?

## 2021-04-14 NOTE — Progress Notes (Signed)
The patient is positive for Salmonella and having diarrhea. Paged Deborah Mcmillan to ask for an order for Imodium or equivalent and possible antibiotics. ?

## 2021-04-14 NOTE — Assessment & Plan Note (Addendum)
Back on home dose of Toprol-XL and Cardizem.  Blood pressures controlled. ?Resume home dose of Aldactone at discharge.  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 ?

## 2021-04-14 NOTE — H&P (Signed)
?History and Physical  ? ? ?Patient: Deborah Mcmillan SEG:[id number] DOB: 1934-07-24 ?DOA: 04/13/2021 ?DOS: the patient was seen and examined on 04/14/2021 ?PCP: Deland Pretty, MD  ?Patient coming from: Home ? ?Chief Complaint:  ?Chief Complaint  ?Patient presents with  ? Emesis  ? Diarrhea  ? ?HPI: Deborah Mcmillan is a 87 y.o. female with medical history significant of HTN, PAF, COPD. ? ?Pt presents to ED with c/o fever, N/V/D.  Onset 1am yesterday morning.  Persistent throughout day.  No abd pan.  No hematuria, dysuria, melena, hematochezia. ? ?Is on [facility] for A.Fib. ? ?Looks like pulm put her on course of levaquin ~1 month ago for bronchitis.  However, pt states she only took 1 dose of this med before it caused severe nausea and vomiting and she stopped the med. ? ? ?Review of Systems: As mentioned in the history of present illness. All other systems reviewed and are negative. ?Past Medical History:  ?Diagnosis Date  ? Arthritis   ? Asthma   ? Complication of anesthesia   ? difficult to wake up and agitation  ? Dyslipidemia   ? Dysrhythmia   ? h/o atrial couplets with short PAT, h/o a-fib during surgery (event monitor)  ? GERD (gastroesophageal reflux disease)   ? History of nuclear stress test 05/11/2009  ? dipyridamole; normal, low risk   ? Hypertension   ? Osteoporosis   ? Pneumonia   ? PONV (postoperative nausea and vomiting)   ? Postoperative atrial fibrillation ([place]) 01/11/2009  ? Seasonal allergies   ? ?Past Surgical History:  ?Procedure Laterality Date  ? APPENDECTOMY  1961  ? BLADDER SURGERY  2002  ? BREAST BIOPSY Left 05/09/2012  ? calcified and hyalinized fibroadenomatous and sclerosing adenosis  ? CHOLECYSTECTOMY N/A 01/01/2021  ? Procedure: LAPAROSCOPIC CHOLECYSTECTOMY;  Surgeon: Clovis Riley, MD;  Location: [facility];  Service: General;  Laterality: N/A;  ? COSMETIC SURGERY  2011  ? 1 episode of atrial fibrillation during this   ? HERNIA REPAIR  1999  ? hiatal  ? lump removal  05/06/2009  ? [facility]  ? NM  MYOVIEW LTD  May 2011  ? No ischemia or infarction.  ? SHOULDER SURGERY Left Feb 2015  ? Dr. Veverly Fells - arthroscopy (rotator cuff ?debridement plus biceps tenodesis)  ? TRANSTHORACIC ECHOCARDIOGRAM  05/2009  ? EF=>55%, with normal LV systolic function, impaired LV relaxation; trace MR/TR; mild AV regurg  ? ?Social History:  reports that she has never smoked. She has never used smokeless tobacco. She reports that she does not drink alcohol and does not use drugs. ? ?Allergies  ?Allergen Reactions  ? Calcium-Containing Compounds Other (See Comments)  ?  Muscle cramping  ? Magnesium-Containing Compounds Itching and Rash  ? Tape Itching and Rash  ?  Plastic, and "brown tape"  ? Nitrofurantoin Other (See Comments)  ? Welchol [Colesevelam Hcl] Other (See Comments)  ? Cefuroxime Axetil Other (See Comments)  ?  leg pain  ? Codeine Nausea Only  ?  Pain & upset stomach  ? Crestor [Rosuvastatin] Other (See Comments)  ?  Statin intolerance  ? Lodine [Etodolac] Other (See Comments)  ?  Upset stomach  ? Niacin And Related Rash  ?  "on fire"  ? Penicillins Itching, Swelling and Rash  ?  She reports some swelling and itchy.   ? Reclast [Zoledronic Acid] Other (See Comments)  ?  Caused severe jaw pain  ? Sulfa Antibiotics Other (See Comments)  ?  Pain &  upset stomach  ? Tetracyclines & Related Rash  ?  Upset stomach  ? Welchol [Colesevelam] Nausea Only and Other (See Comments)  ?  Stomach did not feel good  ? Zetia [Ezetimibe] Other (See Comments)  ?  Muscle pain  ? ? ?Family History  ?Problem Relation Age of Onset  ? Cancer Father   ? Cancer - Other Father   ? Cancer Brother   ? Cancer - Other Brother   ? Stroke Mother   ? Cancer - Other Sister   ? Cancer - Other Sister   ? Breast cancer Neg Hx   ? ? ?Prior to Admission medications   ?Medication Sig Start Date End Date Taking? Authorizing Provider  ?albuterol (VENTOLIN HFA) 108 (90 Base) MCG/ACT inhaler Inhale 1 puff into the lungs every 4 (four) hours as needed for wheezing or  shortness of breath. 09/25/20   Hunsucker, Bonna Gains, MD  ?Cholecalciferol (VITAMIN D) 50 MCG (2000 UT) CAPS Take 1 capsule by mouth daily.    [provider]  ?denosumab (PROLIA) 60 MG/ML SOSY injection Inject 60 mg into the skin every 6 (six) months.    [provider]  ?diltiazem (CARDIZEM CD) 120 MG 24 hr capsule Take 1 capsule (120 mg total) by mouth daily. ?Patient taking differently: Take 120 mg by mouth daily with supper. 08/18/20   Adrian Prows, MD  ?fluticasone Asencion Islam) 50 MCG/ACT nasal spray 2 sprays each nostril twice a day for 7 days then 2 sprays each nostril once a day thereafter 03/19/21   Hunsucker, Bonna Gains, MD  ?levofloxacin (LEVAQUIN) 750 MG tablet Take 1 tablet (750 mg total) by mouth daily. 03/19/21   Hunsucker, Bonna Gains, MD  ?MAGNESIUM CITRATE PO Take 250 mg by mouth in the morning.    [provider]  ?metoprolol succinate (TOPROL-XL) 100 MG 24 hr tablet TAKE 1 TABLET(100 MG) BY MOUTH EVERY EVENING 03/12/21   Adrian Prows, MD  ?omeprazole (PRILOSEC) 20 MG capsule Take 20 mg by mouth in the morning.    [provider]  ?PRALUENT 75 MG/ML SOAJ INJECT 75 MG UNDER THE SKIN EVERY 14 DAYS 07/11/20   Adrian Prows, MD  ?rivaroxaban (XARELTO) 20 MG TABS tablet Take 1 tablet (20 mg total) by mouth daily with supper. 02/16/21   Adrian Prows, MD  ?spironolactone (ALDACTONE) 50 MG tablet TAKE 1 TABLET BY MOUTH EVERY MORNING 09/12/20   Adrian Prows, MD  ?Tiotropium Bromide-Olodaterol (STIOLTO RESPIMAT) 2.5-2.5 MCG/ACT AERS Inhale 2 puffs into the lungs daily. 03/19/21   Hunsucker, Bonna Gains, MD  ?traMADol (ULTRAM) 50 MG tablet Take 1 tablet (50 mg total) by mouth every 6 (six) hours as needed. 01/01/21 01/01/22  Clovis Riley, MD  ? ? ?Physical Exam: ?Vitals:  ? 04/14/21 0000 04/14/21 0015 04/14/21 0030 04/14/21 0136  ?BP: (!) 133/56 (!) 132/53 97/89 (!) 123/93  ?Pulse: 95 98 94 71  ?Resp:   18 16  ?Temp:   99.5 ?F (37.5 ?C) 100 ?F (37.8 ?C)  ?TempSrc:   Oral Oral  ?SpO2: 90% 93% 92% 100%   ?Weight:    77.5 kg  ?Height:    5' (1.524 m)  ? ?Constitutional: NAD, calm, comfortable ?Eyes: PERRL, lids and conjunctivae normal ?ENMT: Mucous membranes are dry. Posterior pharynx clear of any exudate or lesions.Normal dentition.  ?Neck: normal, supple, no masses, no thyromegaly ?Respiratory: clear to auscultation bilaterally, no wheezing, no crackles. Normal respiratory effort. No accessory muscle use.  ?Cardiovascular: Regular rate and rhythm, no murmurs /  rubs / gallops. No extremity edema. 2+ pedal pulses. No carotid bruits.  ?Abdomen: no tenderness, no masses palpated. No hepatosplenomegaly. Bowel sounds positive.  ?Musculoskeletal: no clubbing / cyanosis. No joint deformity upper and lower extremities. Good ROM, no contractures. Normal muscle tone.  ?Skin: no rashes, lesions, ulcers. No induration ?Neurologic: CN 2-12 grossly intact. Sensation intact, DTR normal. Strength 5/5 in all 4.  ?Psychiatric: Normal judgment and insight. Alert and oriented x 3. Normal mood.  ? ?Data Reviewed: ? ?WBC 10k ?Borderline hypotensive in ED with S.Tach.  Improved after 30cc/kg IVF bolus. ? ?Lactate nl. ? ?CT AP = fluid filled small and large bowel suggestive of enterocolitis. ? ?Covid and flu neg. ? ?Assessment and Plan: ?* Enterocolitis ?Presumed infectious. ?With N/V/D ?IVF: 30cc/kg bolus then LR at 125 ?NPO for the moment except sips with meds ?zofran PRN ?Getting GI pathogen pnl ?C.Diff pending - ?Seems less likely based on HPI though ?Lactate nl ?Checking procalcitonin ?WBC only 10k, holding off on ABx for the moment. ? ?Discussion: Sepsis felt less likely despite soft BPs initially.  I think patient is just VERY dehydrated based on urine SG, pt symptoms, etc. ? ?Essential hypertension ?Hold home BP meds ? ?Paroxysmal a-fib ?Usually in NSR: 1 episode post op in 2011, had 2nd episode that brought her into ED in Fall 2022. ?Continue Xarelto ?Hold BB and CCB for the moment given soft BPs in ED. ?Tele monitor ? ? ? ? ?  Advance Care Planning:   Code Status: Full Code ? ?Consults: None ? ?Family Communication: No family in room ? ?Severity of Illness: ?The appropriate patient status for this patient is OBSERVATION. Observation stat

## 2021-04-14 NOTE — Assessment & Plan Note (Deleted)
Presumed infectious. ?With N/V/D ?1. IVF: 30cc/kg bolus then LR at 125 ?2. NPO for the moment except sips with meds ?3. zofran PRN ?4. Getting GI pathogen pnl ?5. C.Diff pending - ?1. Seems less likely based on HPI though ?6. Lactate nl ?7. Checking procalcitonin ?8. WBC only 10k, holding off on ABx for the moment. ? ?Discussion: Sepsis felt less likely despite soft BPs initially.  I think patient is just VERY dehydrated based on urine SG, pt symptoms, etc. ?

## 2021-04-14 NOTE — Assessment & Plan Note (Addendum)
Usually in NSR: 1 episode post op in 2011, had 2nd episode that brought her into ED in Fall 2022. ?Continue Xarelto ?Toprol-XL and Cardizem which were held on admission due to soft blood pressures have been restarted and tolerating. ?

## 2021-04-15 DIAGNOSIS — R112 Nausea with vomiting, unspecified: Secondary | ICD-10-CM | POA: Diagnosis present

## 2021-04-15 DIAGNOSIS — N3941 Urge incontinence: Secondary | ICD-10-CM | POA: Diagnosis present

## 2021-04-15 DIAGNOSIS — E860 Dehydration: Secondary | ICD-10-CM | POA: Diagnosis present

## 2021-04-15 DIAGNOSIS — E876 Hypokalemia: Secondary | ICD-10-CM | POA: Diagnosis present

## 2021-04-15 DIAGNOSIS — I10 Essential (primary) hypertension: Secondary | ICD-10-CM | POA: Diagnosis present

## 2021-04-15 DIAGNOSIS — Z8701 Personal history of pneumonia (recurrent): Secondary | ICD-10-CM | POA: Diagnosis not present

## 2021-04-15 DIAGNOSIS — Z9049 Acquired absence of other specified parts of digestive tract: Secondary | ICD-10-CM | POA: Diagnosis not present

## 2021-04-15 DIAGNOSIS — K769 Liver disease, unspecified: Secondary | ICD-10-CM | POA: Diagnosis present

## 2021-04-15 DIAGNOSIS — R350 Frequency of micturition: Secondary | ICD-10-CM | POA: Diagnosis present

## 2021-04-15 DIAGNOSIS — J449 Chronic obstructive pulmonary disease, unspecified: Secondary | ICD-10-CM | POA: Diagnosis present

## 2021-04-15 DIAGNOSIS — A020 Salmonella enteritis: Secondary | ICD-10-CM

## 2021-04-15 DIAGNOSIS — K219 Gastro-esophageal reflux disease without esophagitis: Secondary | ICD-10-CM | POA: Diagnosis present

## 2021-04-15 DIAGNOSIS — E785 Hyperlipidemia, unspecified: Secondary | ICD-10-CM | POA: Diagnosis present

## 2021-04-15 DIAGNOSIS — Z20822 Contact with and (suspected) exposure to covid-19: Secondary | ICD-10-CM | POA: Diagnosis present

## 2021-04-15 DIAGNOSIS — I959 Hypotension, unspecified: Secondary | ICD-10-CM | POA: Diagnosis present

## 2021-04-15 DIAGNOSIS — Z882 Allergy status to sulfonamides status: Secondary | ICD-10-CM | POA: Diagnosis not present

## 2021-04-15 DIAGNOSIS — Z880 Allergy status to penicillin: Secondary | ICD-10-CM | POA: Diagnosis not present

## 2021-04-15 DIAGNOSIS — I480 Paroxysmal atrial fibrillation: Secondary | ICD-10-CM | POA: Diagnosis present

## 2021-04-15 DIAGNOSIS — M1990 Unspecified osteoarthritis, unspecified site: Secondary | ICD-10-CM | POA: Diagnosis present

## 2021-04-15 DIAGNOSIS — Z7901 Long term (current) use of anticoagulants: Secondary | ICD-10-CM | POA: Diagnosis not present

## 2021-04-15 DIAGNOSIS — Z885 Allergy status to narcotic agent status: Secondary | ICD-10-CM | POA: Diagnosis not present

## 2021-04-15 DIAGNOSIS — R32 Unspecified urinary incontinence: Secondary | ICD-10-CM | POA: Diagnosis present

## 2021-04-15 DIAGNOSIS — M810 Age-related osteoporosis without current pathological fracture: Secondary | ICD-10-CM | POA: Diagnosis present

## 2021-04-15 DIAGNOSIS — Z79899 Other long term (current) drug therapy: Secondary | ICD-10-CM | POA: Diagnosis not present

## 2021-04-15 DIAGNOSIS — Z881 Allergy status to other antibiotic agents status: Secondary | ICD-10-CM | POA: Diagnosis not present

## 2021-04-15 LAB — BASIC METABOLIC PANEL
Anion gap: 7 (ref 5–15)
BUN: 21 mg/dL (ref 8–23)
CO2: 19 mmol/L — ABNORMAL LOW (ref 22–32)
Calcium: 7.6 mg/dL — ABNORMAL LOW (ref 8.9–10.3)
Chloride: 109 mmol/L (ref 98–111)
Creatinine, Ser: 0.72 mg/dL (ref 0.44–1.00)
GFR, Estimated: >60 mL/min (ref >60)
Glucose, Bld: 87 mg/dL (ref 70–99)
Potassium: 3 mmol/L — ABNORMAL LOW (ref 3.5–5.1)
Sodium: 135 mmol/L (ref 135–145)

## 2021-04-15 LAB — MAGNESIUM: Magnesium: 1.7 mg/dL (ref 1.7–2.4)

## 2021-04-15 MED ORDER — POTASSIUM CHLORIDE 2 MEQ/ML IV SOLN
INTRAVENOUS | Status: DC
  Filled 2021-04-15 (×8): qty 1000

## 2021-04-15 MED ORDER — POTASSIUM CHLORIDE CRYS ER 20 MEQ PO TBCR
40.0000 meq | EXTENDED_RELEASE_TABLET | ORAL | Status: AC
  Administered 2021-04-15 (×2): 40 meq via ORAL
  Filled 2021-04-15 (×2): qty 2

## 2021-04-15 NOTE — Assessment & Plan Note (Addendum)
Secondary to GI losses and poor oral intake. ?Replaced.  Magnesium 1.7, replacing magnesium IV. ?Follow BMP in a.m. ?

## 2021-04-15 NOTE — Assessment & Plan Note (Addendum)
Started approximately 24 to 36 hours after a meal at [facility] [employer] containing shrimp and beef.   ?Stool C. difficile negative but positive for Salmonella.  ?Treated supportively with diet as tolerated, IV fluids.  IV fluids discontinued since she is able to tolerate p.o. and reports urinary frequency ?Enteric isolation ?As per communication with ID MD, managed conservatively and without antibiotics. ?Urine culture shows 20 K colonies of Salmonella species and 60 K colonies of Enterococcus faecalis species.  Unclear why urine culture was sent.?  Significance.  Health department reportedly notified of urine culture results ?Diarrhea definitely significantly improved compared to admission.  However over the last 2 days had issues with varying volumes of stools, consistency had improved to some BMs with soft stools and some still watery but her main issue was urgency and at times unable to control until going to the bathroom and stool incontinence.  ?Discussed with ID MD on-call and since its been several days out, okay to try couple doses of Imodium and see if it helps her diarrhea. ?Improved after trial of Imodium.  No BM overnight.  Urine frequency has much reduced.  Tolerating regular diet without nausea or vomiting.  Ambulating in room without difficulty and patient ready to go home. ?Patient states that her son has already gotten her OTC Imodium and she was advised to follow instructions and do not take it more than recommended dose on the package. ?

## 2021-04-15 NOTE — Assessment & Plan Note (Signed)
Incidental finding on CT abdomen as noted below ?There is 6 mm low-density lesion in the right lobe of liver suggesting possible cyst or hemangioma. ?Outpatient follow-up as deemed necessary. ?

## 2021-04-15 NOTE — Hospital Course (Addendum)
87 year old female, lives with her son, medical history significant for HTN, PAF on Xarelto, HLD, GERD, asthma/COPD, multiple medication intolerances, presented to ED with complaints of fever, nausea, vomiting, profuse diarrhea, poor oral intake which started approximately 24 to 36 hours after a meal at [facility] express containing shrimp and beef.  Stool C. difficile negative but positive for Salmonella.  Admitted for Salmonella acute GE.  Treating supportively.  Slowly improving but still had some urgency and incontinence issues and quite reluctant to go home until those have improved.  Improved after a trial of Imodium. ?

## 2021-04-15 NOTE — Plan of Care (Signed)
?  Problem: Education: ?Goal: Knowledge of General Education information will improve ?Description: Including pain rating scale, medication(s)/side effects and non-pharmacologic comfort measures ?Outcome: Progressing ?  ?Problem: Clinical Measurements: ?Goal: Ability to maintain clinical measurements within normal limits will improve ?Outcome: Progressing ?  ?Problem: Nutrition ?Goal: Patient maintains adequate hydration ?Outcome: Progressing ?  ?

## 2021-04-15 NOTE — Progress Notes (Signed)
?PROGRESS NOTE ?  ?Deborah Mcmillan  RRN:[id number]    DOB: May 08, 1934    DOA: 04/13/2021 ? ?PCP: Deland Pretty, MD  ? ?I have briefly reviewed patients previous medical records in [facility]. ? ?Chief Complaint  ?Patient presents with  ? Emesis  ? Diarrhea  ? ? ?Hospital Course:  ?87 year old female, lives with her son, medical history significant for HTN, PAF on Xarelto, HLD, GERD, asthma/COPD, multiple medication intolerances, presented to ED with complaints of fever, nausea, vomiting, profuse diarrhea, poor oral intake which started approximately 24 to 36 hours after a meal at [facility] express containing shrimp and beef.  Stool C. difficile negative but positive for Salmonella.  Admitted for Salmonella acute GE.  Treating supportively. ? ?Assessment and Plan: ?* Salmonella gastroenteritis ?Started approximately 24 to 36 hours after a meal at [facility] express containing shrimp and beef.   ?Stool C. difficile negative but positive for Salmonella.  ?Treating for Salmonella acute GE. ?Treating supportively with diet as tolerated, IV fluids. ?Contact isolation ?Would avoid antimotility agents i.e. Imodium or Lomotil. ?Communicated with infectious disease MD regarding treatment with antibiotics.  Also noted multiple antibiotic intolerances including to levofloxacin, cephalosporins, Bactrim etc.  ?As per ID MD input, would manage conservatively and hold off on antibiotics at this time. ? ?Paroxysmal a-fib ?Usually in NSR: 1 episode post op in 2011, had 2nd episode that brought her into ED in Fall 2022. ?Continue Xarelto ?Held BB and CCB for the moment given soft BPs in ED. ?Remains in sinus rhythm. ?Now that her blood pressures have normalized, will at least initiate beta-blockers to avoid recurrence of A-fib. ? ?Essential hypertension ?Briefly held beta-blockers and calcium channel blockers due to hypotension on initial arrival. ?Hypotension has resolved. ?Resumed beta-blockers.  Consider resuming calcium channel blockers  tomorrow or at time of discharge. ? ?Hypokalemia ?Secondary to GI losses and poor oral intake. ?Replace aggressively and follow.  ?Magnesium 1.7, replace and follow. ? ?Liver lesion, right lobe ?Incidental finding on CT abdomen as noted below ?There is 6 mm low-density lesion in the right lobe of liver suggesting possible cyst or hemangioma. ?Outpatient follow-up as deemed necessary. ? ? ?Body mass index is 33.37 kg/m?. ? ? ?DVT prophylaxis:   On Xarelto ?  Code Status: Full Code:  ?Family Communication: None at bedside ?Disposition:  ?Status is: Observation ?The patient will require care spanning > 2 midnights and should be moved to inpatient because: Ongoing diarrhea, IV fluids etc. ?  ? ?Consultants:   ?Discussed with infectious disease MD on call on 4/5. ? ?Procedures:   ? ? ?Antimicrobials:   ?None. ? ? ?Subjective:  ?Reports multiple episodes, maybe 10 episodes of diarrhea overnight, watery, no blood or mucus, not associated with vomiting or abdominal pain or fever.  Had some nausea with poor oral intake.  Had 2 episodes of diarrhea since this morning.  Above was confirmed by patient's RN ? ?Objective:  ? ?Vitals:  ? 04/14/21 1958 04/14/21 2240 04/15/21 0422 04/15/21 0850  ?BP: (!) 132/46 135/62 (!) 121/42 (!) 134/49  ?Pulse: 98 (!) 101 75 74  ?Resp: '20 17 18 17  '$ ?Temp: 99.7 ?F (37.6 ?C) 99.3 ?F (37.4 ?C) 98.5 ?F (36.9 ?C) 98.5 ?F (36.9 ?C)  ?TempSrc: Oral Oral Oral Oral  ?SpO2: 94% 100% 93% 98%  ?Weight:      ?Height:      ? ? ?General exam: Female, moderately built and nourished lying comfortably propped up in bed without distress.  Oral mucosa dry.  Does not look septic or toxic. ?Respiratory system: Clear to auscultation. Respiratory effort normal. ?Cardiovascular system: S1 & S2 heard, RRR. No JVD, murmurs, rubs, gallops or clicks. No pedal edema.  Telemetry personally reviewed: Sinus rhythm. ?Gastrointestinal system: Abdomen is nondistended, soft and nontender. No organomegaly or masses felt. Normal  bowel sounds heard. ?Central nervous system: Alert and oriented. No focal neurological deficits. ?Extremities: Symmetric 5 x 5 power. ?Skin: No rashes, lesions or ulcers ?Psychiatry: Judgement and insight appear normal. Mood & affect appropriate.  ? ?Data Reviewed:   ?I have personally reviewed following labs and imaging studies ? ?CBC: ?Recent Labs  ?Lab 04/13/21 ?1757 04/14/21 ?0205  ?WBC 10.8* 8.6  ?HGB 15.1* 14.0  ?HCT 45.4 40.8  ?MCV 92.8 94.2  ?PLT 199 176  ? ?Basic Metabolic Panel: ?Recent Labs  ?Lab 04/13/21 ?1757 04/14/21 ?0205 04/15/21 ?7989  ?NA 133* 136 135  ?K 4.4 4.0 3.0*  ?CL 102 109 109  ?CO2 18* 18* 19*  ?GLUCOSE 116* 108* 87  ?BUN 31* 24* 21  ?CREATININE 0.97 0.86 0.72  ?CALCIUM 9.4 7.9* 7.6*  ?MG  --   --  1.7  ? ?Liver Function Tests: ?Recent Labs  ?Lab 04/13/21 ?1757  ?AST 48*  ?ALT 30  ?ALKPHOS 56  ?BILITOT 0.9  ?PROT 7.1  ?ALBUMIN 4.5  ? ?CBG: ?No results for input(s): GLUCAP in the last 168 hours. ?Microbiology Studies:  ? ?Recent Results (from the past 240 hour(s))  ?Blood Culture (routine x 2)     Status: None (Preliminary result)  ? Collection Time: 04/13/21  6:08 PM  ? Specimen: BLOOD  ?Result Value Ref Range Status  ? Specimen Description   Final  ?  BLOOD BLOOD RIGHT FOREARM ?Performed at [facility], [address] [place], [place] [address] ?  ? Special Requests   Final  ?  Blood Culture adequate volume BOTTLES DRAWN AEROBIC AND ANAEROBIC ?Performed at [facility], [address], [place], [place] [address] ?  ? Culture   Final  ?  NO GROWTH 2 DAYS ?Performed at [facility], [place] [address] [place], [place] [address] ?  ? Report Status PENDING  Incomplete  ?Blood Culture (routine x 2)     Status: None (Preliminary result)  ? Collection Time: 04/13/21  6:34 PM  ? Specimen: BLOOD  ?Result Value Ref Range Status  ? Specimen Description   Final  ?  BLOOD BLOOD RIGHT FOREARM ?Performed at [facility], [address], [place], [place] [address] ?  ? Special Requests   Final  ?  Blood Culture adequate volume BOTTLES DRAWN AEROBIC AND ANAEROBIC ?Performed at [facility], [address] [place], [place] [address] ?  ? Culture   Final  ?  NO GROWTH 2 DAYS ?Performed at [facility], [place] [address], [place], [place] [address] ?  ? Report Status PENDING  Incomplete  ?Urine Culture     Status: None (Preliminary result)  ? Collection Time: 04/13/21  6:34 PM  ? Specimen: In/Out Cath Urine  ?Result Value Ref Range Status  ? Specimen Description   Final  ?  IN/OUT CATH URINE ?Performed at [facility], [address], [place], [place] [address] ?  ? Special Requests   Final  ?  NONE ?Performed at [facility], [address] [place], [place] [address] ?  ? Culture   Final  ?  CULTURE REINCUBATED FOR BETTER GROWTH ?Performed at [facility], [address], [place], [place] [address] ?  ? Report Status PENDING  Incomplete  ?Resp Panel by RT-PCR (Flu A&B, Covid) Nasopharyngeal Swab     Status: None  ? Collection Time: 04/13/21  8:35 PM  ? Specimen: Nasopharyngeal Swab; Nasopharyngeal(NP) swabs in vial transport medium  ?Result Value Ref Range Status  ? SARS Coronavirus 2 by RT PCR NEGATIVE NEGATIVE Final  ?  Comment: (NOTE) ?SARS-CoV-2 target nucleic acids are NOT DETECTED. ? ?The SARS-CoV-2 RNA is generally detectable in upper respiratory ?specimens during the acute phase of infection. The lowest ?concentration of SARS-CoV-2 viral copies this assay can detect is ?138 copies/mL. A negative result does not preclude SARS-Cov-2 ?infection and should not be used as the sole basis for treatment or ?other patient management decisions. A negative result may occur with  ?improper specimen collection/handling, submission of specimen other ?than nasopharyngeal swab, presence of viral mutation(s) within the ?areas targeted by this assay, and inadequate number of  viral ?copies(<138 copies/mL). A negative result must be combined with ?clinical observations, patient history, and epidemiological ?information. The expected result is Negative. ? ?Fact Sheet for Patients:  ?https://www.f

## 2021-04-16 DIAGNOSIS — E876 Hypokalemia: Secondary | ICD-10-CM | POA: Diagnosis not present

## 2021-04-16 DIAGNOSIS — A020 Salmonella enteritis: Secondary | ICD-10-CM | POA: Diagnosis not present

## 2021-04-16 LAB — CBC
HCT: 37.1 % (ref 36.0–46.0)
Hemoglobin: 12.6 g/dL (ref 12.0–15.0)
MCH: 31.2 pg (ref 26.0–34.0)
MCHC: 34 g/dL (ref 30.0–36.0)
MCV: 91.8 fL (ref 80.0–100.0)
Platelets: 173 10*3/uL (ref 150–400)
RBC: 4.04 MIL/uL (ref 3.87–5.11)
RDW: 13.4 % (ref 11.5–15.5)
WBC: 6.6 10*3/uL (ref 4.0–10.5)
nRBC: 0 % (ref 0.0–0.2)

## 2021-04-16 LAB — BASIC METABOLIC PANEL
Anion gap: 8 (ref 5–15)
BUN: 14 mg/dL (ref 8–23)
CO2: 21 mmol/L — ABNORMAL LOW (ref 22–32)
Calcium: 7.9 mg/dL — ABNORMAL LOW (ref 8.9–10.3)
Chloride: 108 mmol/L (ref 98–111)
Creatinine, Ser: 0.75 mg/dL (ref 0.44–1.00)
GFR, Estimated: >60 mL/min (ref >60)
Glucose, Bld: 76 mg/dL (ref 70–99)
Potassium: 4.2 mmol/L (ref 3.5–5.1)
Sodium: 137 mmol/L (ref 135–145)

## 2021-04-16 LAB — MAGNESIUM: Magnesium: 1.7 mg/dL (ref 1.7–2.4)

## 2021-04-16 MED ORDER — DILTIAZEM HCL ER COATED BEADS 120 MG PO CP24
120.0000 mg | ORAL_CAPSULE | Freq: Every day | ORAL | Status: DC
  Administered 2021-04-16 – 2021-04-18 (×3): 120 mg via ORAL
  Filled 2021-04-16 (×3): qty 1

## 2021-04-16 MED ORDER — ADULT MULTIVITAMIN W/MINERALS CH
1.0000 | ORAL_TABLET | Freq: Every day | ORAL | Status: DC
  Administered 2021-04-16 – 2021-04-18 (×3): 1 via ORAL
  Filled 2021-04-16 (×4): qty 1

## 2021-04-16 MED ORDER — MAGNESIUM SULFATE 2 GM/50ML IV SOLN
2.0000 g | Freq: Once | INTRAVENOUS | Status: AC
  Administered 2021-04-16: 2 g via INTRAVENOUS
  Filled 2021-04-16: qty 50

## 2021-04-16 MED ORDER — VITAMIN D 25 MCG (1000 UNIT) PO TABS
2000.0000 [IU] | ORAL_TABLET | Freq: Every day | ORAL | Status: DC
  Administered 2021-04-16 – 2021-04-19 (×4): 2000 [IU] via ORAL
  Filled 2021-04-16 (×4): qty 2

## 2021-04-16 NOTE — Progress Notes (Addendum)
?PROGRESS NOTE ?  ?Deborah Mcmillan  WIO:[id number]    DOB: 01/06/1935    DOA: 04/13/2021 ? ?PCP: Deland Pretty, MD  ? ?I have briefly reviewed patients previous medical records in [facility]. ? ?Chief Complaint  ?Patient presents with  ? Emesis  ? Diarrhea  ? ? ?Hospital Course:  ?87 year old female, lives with her son, medical history significant for HTN, PAF on Xarelto, HLD, GERD, asthma/COPD, multiple medication intolerances, presented to ED with complaints of fever, nausea, vomiting, profuse diarrhea, poor oral intake which started approximately 24 to 36 hours after a meal at [facility] express containing shrimp and beef.  Stool C. difficile negative but positive for Salmonella.  Admitted for Salmonella acute GE.  Treating supportively.  Slowly improving. ? ?Assessment and Plan: ?* Salmonella gastroenteritis ?Started approximately 24 to 36 hours after a meal at [facility] express containing shrimp and beef.   ?Stool C. difficile negative but positive for Salmonella.  ?Treating supportively with diet as tolerated, IV fluids. ?Enteric isolation ?Would avoid antimotility agents i.e. Imodium or Lomotil. ?Also noted multiple antibiotic intolerances including to levofloxacin, cephalosporins, Bactrim etc.  ?As per communication with ID MD, would manage conservatively and hold off on antibiotics at this time. ?Although 14 BMs documented in the last 24 hours, patient reports that her stool frequency last night was better than in the day yesterday and definitely better than the night prior.  Also does not like the liquid diet, advance to regular diet. ?Continue to monitor closely ?Urine culture shows 20 K colonies of Salmonella species and 60 K colonies of Enterococcus faecalis species.  Unclear why urine culture was sent.?  Significance.  Health department reportedly notified of urine culture results ? ?Paroxysmal a-fib ?Usually in NSR: 1 episode post op in 2011, had 2nd episode that brought her into ED in Fall 2022. ?Continue  Xarelto ?Toprol-XL and Cardizem were held on admission due to soft blood pressures. ?Now that her blood pressures have normalized, resumed Cardizem and patient has already been restarted on Toprol-XL. ? ?Essential hypertension ?Briefly held beta-blockers and calcium channel blockers due to hypotension on initial arrival. ?Hypotension has resolved. ?Resumed prior home dose of Toprol-XL and Cardizem ? ?Hypokalemia ?Secondary to GI losses and poor oral intake. ?Replaced.  Magnesium 1.7, replacing magnesium IV. ?Continue to follow daily BMP due to diarrhea. ? ?Liver lesion, right lobe ?Incidental finding on CT abdomen as noted below ?There is 6 mm low-density lesion in the right lobe of liver suggesting possible cyst or hemangioma. ?Outpatient follow-up as deemed necessary. ? ? ?Body mass index is 33.37 kg/m?. ? ? ?DVT prophylaxis:   On Xarelto ?  Code Status: Full Code:  ?Family Communication: I discussed with her son in person yesterday and via phone today. ?Disposition:  ?Home pending clinical improvement. ?  ? ?Consultants:   ?Discussed with infectious disease MD on call on 4/5 and 4/6. ? ?Procedures:   ? ? ?Antimicrobials:   ?None. ? ? ?Subjective:  ?States that her stool frequency was definitely last night compared to the day yesterday and more so from the night prior.  No abdominal pain.  Does not like liquid diet and wants to try solid food.  Not much nausea.  No vomiting. ? ?Objective:  ? ?Vitals:  ? 04/15/21 2032 04/15/21 2130 04/16/21 0501 04/16/21 1326  ?BP:  (!) 134/43 (!) 133/47 133/66  ?Pulse: 93 79 69 71  ?Resp: '18 19 20 17  '$ ?Temp:  99 ?F (37.2 ?C) 98.5 ?F (36.9 ?C) 98.5 ?F (  36.9 ?C)  ?TempSrc:  Oral Oral Oral  ?SpO2: 96% 98% 98% 98%  ?Weight:      ?Height:      ? ? ?General exam: Female, moderately built and nourished lying comfortably propped up in bed without distress.  Oral mucosa moist.  Does not look septic or toxic. ?Respiratory system: Clear to auscultation. Respiratory effort  normal. ?Cardiovascular system: S1 & S2 heard, RRR. No JVD, murmurs, rubs, gallops or clicks. No pedal edema.   ?Gastrointestinal system: Abdomen is nondistended, soft and nontender. No organomegaly or masses felt. Normal bowel sounds heard.  Unchanged and stable. ?Central nervous system: Alert and oriented. No focal neurological deficits. ?Extremities: Symmetric 5 x 5 power. ?Skin: No rashes, lesions or ulcers ?Psychiatry: Judgement and insight appear normal. Mood & affect appropriate.  ? ?Data Reviewed:   ?I have personally reviewed following labs and imaging studies ? ?CBC: ?Recent Labs  ?Lab 04/13/21 ?1757 04/14/21 ?0205 04/16/21 ?0341  ?WBC 10.8* 8.6 6.6  ?HGB 15.1* 14.0 12.6  ?HCT 45.4 40.8 37.1  ?MCV 92.8 94.2 91.8  ?PLT 199 176 173  ? ?Basic Metabolic Panel: ?Recent Labs  ?Lab 04/13/21 ?1757 04/14/21 ?0205 04/15/21 ?1017 04/16/21 ?0341  ?NA 133* 136 135 137  ?K 4.4 4.0 3.0* 4.2  ?CL 102 109 109 108  ?CO2 18* 18* 19* 21*  ?GLUCOSE 116* 108* 87 76  ?BUN 31* 24* 21 14  ?CREATININE 0.97 0.86 0.72 0.75  ?CALCIUM 9.4 7.9* 7.6* 7.9*  ?MG  --   --  1.7 1.7  ? ?Liver Function Tests: ?Recent Labs  ?Lab 04/13/21 ?1757  ?AST 48*  ?ALT 30  ?ALKPHOS 56  ?BILITOT 0.9  ?PROT 7.1  ?ALBUMIN 4.5  ? ?CBG: ?No results for input(s): GLUCAP in the last 168 hours. ?Microbiology Studies:  ? ?Recent Results (from the past 240 hour(s))  ?Blood Culture (routine x 2)     Status: None (Preliminary result)  ? Collection Time: 04/13/21  6:08 PM  ? Specimen: BLOOD  ?Result Value Ref Range Status  ? Specimen Description   Final  ?  BLOOD BLOOD RIGHT FOREARM ?Performed at [facility], [address] [place], [place] [address] ?  ? Special Requests   Final  ?  Blood Culture adequate volume BOTTLES DRAWN AEROBIC AND ANAEROBIC ?Performed at [facility], [address] [place], [place] [address] ?  ? Culture   Final  ?  NO GROWTH 3 DAYS ?Performed at [facility], [place] [address], [place],  [place] [address] ?  ? Report Status PENDING  Incomplete  ?Blood Culture (routine x 2)     Status: None (Preliminary result)  ? Collection Time: 04/13/21  6:34 PM  ? Specimen: BLOOD  ?Result Value Ref Range Status  ? Specimen Description   Final  ?  BLOOD BLOOD RIGHT FOREARM ?Performed at [facility], [address], [place], [place] [address] ?  ? Special Requests   Final  ?  Blood Culture adequate volume BOTTLES DRAWN AEROBIC AND ANAEROBIC ?Performed at [facility], [address], [place], [place] [address] ?  ? Culture   Final  ?  NO GROWTH 3 DAYS ?Performed at [facility], [place] [address], [place], [place] [address] ?  ? Report Status PENDING  Incomplete  ?Urine Culture     Status: Abnormal (Preliminary result)  ? Collection Time: 04/13/21  6:34 PM  ? Specimen: In/Out Cath Urine  ?Result Value Ref Range Status  ?  Specimen Description   Final  ?  IN/OUT CATH URINE ?Performed at [facility], [address] [place], [place] [address] ?  ? Special Requests   Final  ?  NONE ?Performed at [facility], [address], [place], [place] [address] ?  ? Culture (A)  Final  ?  20,000 COLONIES/mL SALMONELLA SPECIES ?60,000 COLONIES/mL ENTEROCOCCUS FAECALIS ?REPEATING SENSITIVITY ?HEALTH DEPARTMENT NOTIFIED ?Performed at [facility], [place] [address] [place], [place] [address] ?  ? Report Status PENDING  Incomplete  ? Organism ID, Bacteria SALMONELLA SPECIES (A)  Final  ?    Susceptibility  ? Salmonella species - MIC*  ?  AMPICILLIN <=2 SENSITIVE Sensitive   ?  LEVOFLOXACIN <=0.12 SENSITIVE Sensitive   ?  TRIMETH/SULFA <=20 SENSITIVE Sensitive   ?  * 20,000 COLONIES/mL SALMONELLA SPECIES  ?Resp Panel by RT-PCR (Flu A&B, Covid) Nasopharyngeal Swab     Status: None  ? Collection Time: 04/13/21  8:35 PM  ? Specimen: Nasopharyngeal Swab; Nasopharyngeal(NP) swabs in vial transport medium  ?Result Value Ref Range Status  ? SARS Coronavirus 2 by  RT PCR NEGATIVE NEGATIVE Final  ?  Comment: (NOTE) ?SARS-CoV-2 target nucleic acids are NOT DETECTED. ? ?The SARS-CoV-2 RNA is generally detectable in upper respiratory ?specimens during the acute phase of infection.

## 2021-04-16 NOTE — TOC Progression Note (Signed)
Transition of Care (TOC) - Progression Note  ? ? ?Patient Details  ?Name: Deborah Mcmillan ?MRN: [id number] ?Date of Birth: 1934/12/20 ? ?Transition of Care (TOC) CM/SW Contact  ?Purcell Mouton, RN ?Phone Number: ?04/16/2021, 4:58 PM ? ?Clinical Narrative:    ?Pt plan to discharge home when stable.  ? ? ?  ?Barriers to Discharge: No Barriers Identified ? ?Expected Discharge Plan and Services ?  ?  ?  ?  ?Living arrangements for the past 2 months: [place] ?                ?  ?  ?  ?  ?  ?  ?  ?  ?  ?  ? ? ?Social Determinants of Health (SDOH) Interventions ?  ? ?Readmission Risk Interventions ?   ? View : No data to display.  ?  ?  ?  ? ? ?

## 2021-04-16 NOTE — TOC Progression Note (Signed)
Transition of Care (TOC) - Progression Note  ? ? ?Patient Details  ?Name: Deborah Mcmillan ?MRN: [id number] ?Date of Birth: 1934/10/30 ? ?Transition of Care (TOC) CM/SW Contact  ?Purcell Mouton, RN ?Phone Number: ?04/16/2021, 4:45 PM ? ?Clinical Narrative:    ? ? ?Transition of Care (TOC) Screening Note ? ? ?Patient Details  ?Name: Deborah Mcmillan ?Date of Birth: 1934/04/13 ? ? ?Transition of Care (TOC) CM/SW Contact:    ?Purcell Mouton, RN ?Phone Number: ?04/16/2021, 4:45 PM ? ? ? ?Transition of Care Department [facility]) has reviewed patient and no TOC needs have been identified at this time. We will continue to monitor patient advancement through interdisciplinary progression rounds. If new patient transition needs arise, please place a TOC consult. ?  ? ?  ?  ? ?Expected Discharge Plan and Services ?  ?  ?  ?  ?  ?                ?  ?  ?  ?  ?  ?  ?  ?  ?  ?  ? ? ?Social Determinants of Health (SDOH) Interventions ?  ? ?Readmission Risk Interventions ?   ? View : No data to display.  ?  ?  ?  ? ? ?

## 2021-04-17 DIAGNOSIS — E876 Hypokalemia: Secondary | ICD-10-CM | POA: Diagnosis not present

## 2021-04-17 DIAGNOSIS — A020 Salmonella enteritis: Secondary | ICD-10-CM | POA: Diagnosis not present

## 2021-04-17 LAB — BASIC METABOLIC PANEL
Anion gap: 6 (ref 5–15)
BUN: 10 mg/dL (ref 8–23)
CO2: 22 mmol/L (ref 22–32)
Calcium: 8.4 mg/dL — ABNORMAL LOW (ref 8.9–10.3)
Chloride: 109 mmol/L (ref 98–111)
Creatinine, Ser: 0.66 mg/dL (ref 0.44–1.00)
GFR, Estimated: >60 mL/min (ref >60)
Glucose, Bld: 88 mg/dL (ref 70–99)
Potassium: 4.7 mmol/L (ref 3.5–5.1)
Sodium: 137 mmol/L (ref 135–145)

## 2021-04-17 LAB — MAGNESIUM: Magnesium: 1.9 mg/dL (ref 1.7–2.4)

## 2021-04-17 NOTE — Care Management Important Message (Signed)
Important Message ? ?Patient Details IM Letter placed in Patients room. ?Name: Deborah Mcmillan ?MRN: [id number] ?Date of Birth: 04/20/1934 ? ? ?Medicare Important Message Given:  Yes ? ? ? ? ?Kerin Salen ?04/17/2021, 2:00 PM ?

## 2021-04-17 NOTE — Progress Notes (Signed)
?PROGRESS NOTE ?  ?Deborah Mcmillan  QJJ:[id number]    DOB: October 10, 1934    DOA: 04/13/2021 ? ?PCP: Deland Pretty, MD  ? ?I have briefly reviewed patients previous medical records in [facility]. ? ?Chief Complaint  ?Patient presents with  ? Emesis  ? Diarrhea  ? ? ?Hospital Course:  ?87 year old female, lives with her son, medical history significant for HTN, PAF on Xarelto, HLD, GERD, asthma/COPD, multiple medication intolerances, presented to ED with complaints of fever, nausea, vomiting, profuse diarrhea, poor oral intake which started approximately 24 to 36 hours after a meal at [facility] express containing shrimp and beef.  Stool C. difficile negative but positive for Salmonella.  Admitted for Salmonella acute GE.  Treating supportively.  Slowly improving.  Hopeful DC home 4/8. ? ?Assessment and Plan: ?* Salmonella gastroenteritis ?Started approximately 24 to 36 hours after a meal at [facility] express containing shrimp and beef.   ?Stool C. difficile negative but positive for Salmonella.  ?Treating supportively with diet as tolerated, IV fluids.  IV fluids discontinued since she is able to tolerate p.o. and reports urinary frequency ?Enteric isolation ?Would avoid antimotility agents i.e. Imodium or Lomotil. ?Also noted multiple antibiotic intolerances including to levofloxacin, cephalosporins, Bactrim etc.  ?As per communication with ID MD, would manage conservatively and hold off on antibiotics at this time. ?Urine culture shows 20 K colonies of Salmonella species and 60 K colonies of Enterococcus faecalis species.  Unclear why urine culture was sent.?  Significance.  Health department reportedly notified of urine culture results ?Diarrhea gradually improving.  As per patient and RN, had only a couple episodes of small-volume stools yesterday and last night.  However since this morning, more stooling than last night, some incontinent episodes and nausea. ?Continue regular diet, monitor an additional night and  hopefully should improve for DC home tomorrow. ? ?Paroxysmal a-fib ?Usually in NSR: 1 episode post op in 2011, had 2nd episode that brought her into ED in Fall 2022. ?Continue Xarelto ?Toprol-XL and Cardizem which were held on admission due to soft blood pressures have been restarted and tolerating. ? ?Essential hypertension ?Back on home dose of Toprol-XL and Cardizem.  Blood pressures controlled. ? ?Hypokalemia ?Secondary to GI losses and poor oral intake. ?Replaced.  Magnesium 1.7, replacing magnesium IV. ?Continue to follow daily BMP due to diarrhea. ? ?Liver lesion, right lobe ?Incidental finding on CT abdomen as noted below ?There is 6 mm low-density lesion in the right lobe of liver suggesting possible cyst or hemangioma. ?Outpatient follow-up as deemed necessary. ? ? ?Body mass index is 33.37 kg/m?. ? ? ?DVT prophylaxis:   On Xarelto ?  Code Status: Full Code:  ?Family Communication: None at bedside. ?Disposition:  ?Home pending clinical improvement, hopefully 4/8. ?  ? ?Consultants:   ?Discussed with infectious disease MD on call on 4/5 and 4/6. ? ?Procedures:   ? ? ?Antimicrobials:   ?None. ? ? ?Subjective:  ?Seen this morning along with RN at bedside.  Reported frequency and volume of stools had significantly improved both yesterday and last night.  However some more stooling since this morning with incontinent episodes and nausea.  Patient does not feel safe to go home.  Also reports increased urinary frequency, likely related to IV fluids, without dysuria.  Also patient reports being tired of being stuck for lab work and showed some bruises on her left cubital fossa ?Objective:  ? ?Vitals:  ? 04/16/21 2205 04/17/21 0617 04/17/21 0903 04/17/21 1210  ?BP: (!) 154/64 Marland Kitchen)  130/59  136/66  ?Pulse: 76 (!) 59  75  ?Resp: '18 18  18  '$ ?Temp: 98.4 ?F (36.9 ?C) 97.8 ?F (36.6 ?C)  98.2 ?F (36.8 ?C)  ?TempSrc: Oral Oral  Oral  ?SpO2: 100% 100% 98% 99%  ?Weight:      ?Height:      ? ? ?General exam: Female, moderately  built and nourished lying comfortably propped up in bed without distress.  Oral mucosa moist.  Does not look septic or toxic. ?Respiratory system: Clear to auscultation. Respiratory effort normal. ?Cardiovascular system: S1 & S2 heard, RRR. No JVD, murmurs, rubs, gallops or clicks. No pedal edema.   ?Gastrointestinal system: Abdomen is nondistended, soft and nontender.  Normal bowel sounds heard. ?Central nervous system: Alert and oriented. No focal neurological deficits. ?Extremities: Symmetric 5 x 5 power. ?Skin: No rashes, lesions or ulcers ?Psychiatry: Judgement and insight appear normal. Mood & affect appropriate.  ? ?Data Reviewed:   ?I have personally reviewed following labs and imaging studies ? ?CBC: ?Recent Labs  ?Lab 04/13/21 ?1757 04/14/21 ?0205 04/16/21 ?0341  ?WBC 10.8* 8.6 6.6  ?HGB 15.1* 14.0 12.6  ?HCT 45.4 40.8 37.1  ?MCV 92.8 94.2 91.8  ?PLT 199 176 173  ? ?Basic Metabolic Panel: ?Recent Labs  ?Lab 04/13/21 ?1757 04/14/21 ?0205 04/15/21 ?6811 04/16/21 ?5726 04/17/21 ?2035  ?NA 133* 136 135 137 137  ?K 4.4 4.0 3.0* 4.2 4.7  ?CL 102 109 109 108 109  ?CO2 18* 18* 19* 21* 22  ?GLUCOSE 116* 108* 87 76 88  ?BUN 31* 24* '21 14 10  '$ ?CREATININE 0.97 0.86 0.72 0.75 0.66  ?CALCIUM 9.4 7.9* 7.6* 7.9* 8.4*  ?MG  --   --  1.7 1.7 1.9  ? ?Liver Function Tests: ?Recent Labs  ?Lab 04/13/21 ?1757  ?AST 48*  ?ALT 30  ?ALKPHOS 56  ?BILITOT 0.9  ?PROT 7.1  ?ALBUMIN 4.5  ? ?CBG: ?No results for input(s): GLUCAP in the last 168 hours. ?Microbiology Studies:  ? ?Recent Results (from the past 240 hour(s))  ?Blood Culture (routine x 2)     Status: None (Preliminary result)  ? Collection Time: 04/13/21  6:08 PM  ? Specimen: BLOOD  ?Result Value Ref Range Status  ? Specimen Description   Final  ?  BLOOD BLOOD RIGHT FOREARM ?Performed at [facility], [address] [place], [place] [address] ?  ? Special Requests   Final  ?  Blood Culture adequate volume BOTTLES DRAWN AEROBIC AND ANAEROBIC ?Performed at  [facility], [address], [place], [place] [address] ?  ? Culture   Final  ?  NO GROWTH 3 DAYS ?Performed at [facility], [place] [address], [place], [place] [address] ?  ? Report Status PENDING  Incomplete  ?Blood Culture (routine x 2)     Status: None (Preliminary result)  ? Collection Time: 04/13/21  6:34 PM  ? Specimen: BLOOD  ?Result Value Ref Range Status  ? Specimen Description   Final  ?  BLOOD BLOOD RIGHT FOREARM ?Performed at [facility], [address] [place], [place] [address] ?  ? Special Requests   Final  ?  Blood Culture adequate volume BOTTLES DRAWN AEROBIC AND ANAEROBIC ?Performed at [facility], [address] [place], [place] [address] ?  ? Culture   Final  ?  NO GROWTH 3 DAYS ?Performed at [facility], [place] [address], [place], [place] [address] ?  ? Report Status PENDING  Incomplete  ?  Urine Culture     Status: Abnormal (Preliminary result)  ? Collection Time: 04/13/21  6:34 PM  ? Specimen: In/Out Cath Urine  ?Result Value Ref Range Status  ? Specimen Description   Final  ?  IN/OUT CATH URINE ?Performed at [facility], [address], [place], [place] [address] ?  ? Special Requests   Final  ?  NONE ?Performed at [facility], [address] [place], [place] [address] ?  ? Culture (A)  Final  ?  20,000 COLONIES/mL SALMONELLA SPECIES ?60,000 COLONIES/mL ENTEROCOCCUS FAECALIS ?HEALTH DEPARTMENT NOTIFIED ?SENT TO STATE LAB FOR CONFIRMATION ?Performed at [facility], [place] [address], [place],  [address] ?  ? Report Status PENDING  Incomplete  ? Organism ID, Bacteria SALMONELLA SPECIES (A)  Final  ? Organism ID, Bacteria ENTEROCOCCUS FAECALIS (A)  Final  ?    Susceptibility  ? Enterococcus faecalis - MIC*  ?  AMPICILLIN <=2 SENSITIVE Sensitive   ?  NITROFURANTOIN <=16 SENSITIVE Sensitive   ?  VANCOMYCIN 1 SENSITIVE Sensitive   ?  * 60,000 COLONIES/mL ENTEROCOCCUS  FAECALIS  ? Salmonella species - MIC*  ?  AMPICILLIN <=2 SENSITIVE Sensitive   ?  LEVOFLOXACIN <=0.12 SENSITIVE Sensitive   ?  TRIMETH/SULFA <=20 SENSITIVE Sensitive   ?  * 20,000 COLONIES/mL SALMONELLA SPECIES  ?R

## 2021-04-18 DIAGNOSIS — E876 Hypokalemia: Secondary | ICD-10-CM | POA: Diagnosis not present

## 2021-04-18 DIAGNOSIS — A020 Salmonella enteritis: Secondary | ICD-10-CM | POA: Diagnosis not present

## 2021-04-18 LAB — CULTURE, BLOOD (ROUTINE X 2)
Culture: NO GROWTH
Culture: NO GROWTH
Special Requests: ADEQUATE
Special Requests: ADEQUATE

## 2021-04-18 MED ORDER — LOPERAMIDE HCL 2 MG PO CAPS
2.0000 mg | ORAL_CAPSULE | Freq: Three times a day (TID) | ORAL | Status: DC | PRN
  Administered 2021-04-18 (×2): 2 mg via ORAL
  Filled 2021-04-18 (×2): qty 1

## 2021-04-18 NOTE — Progress Notes (Signed)
?PROGRESS NOTE ?  ?Deborah Mcmillan  IPJ:[id number]    DOB: 15-Jun-1934    DOA: 04/13/2021 ? ?PCP: Deland Pretty, MD  ? ?I have briefly reviewed patients previous medical records in [facility]. ? ?Chief Complaint  ?Patient presents with  ? Emesis  ? Diarrhea  ? ? ?Hospital Course:  ?87 year old female, lives with her son, medical history significant for HTN, PAF on Xarelto, HLD, GERD, asthma/COPD, multiple medication intolerances, presented to ED with complaints of fever, nausea, vomiting, profuse diarrhea, poor oral intake which started approximately 24 to 36 hours after a meal at [facility] express containing shrimp and beef.  Stool C. difficile negative but positive for Salmonella.  Admitted for Salmonella acute GE.  Treating supportively.  Slowly improving but still having some urgency and incontinence issues and quite reluctant to go home until those have improved.  Trial of Imodium after discussing with ID. ? ?Assessment and Plan: ?* Salmonella gastroenteritis ?Started approximately 24 to 36 hours after a meal at [facility] express containing shrimp and beef.   ?Stool C. difficile negative but positive for Salmonella.  ?Treating supportively with diet as tolerated, IV fluids.  IV fluids discontinued since she is able to tolerate p.o. and reports urinary frequency ?Enteric isolation ?As per communication with ID MD, would manage conservatively and hold off on antibiotics at this time. ?Urine culture shows 20 K colonies of Salmonella species and 60 K colonies of Enterococcus faecalis species.  Unclear why urine culture was sent.?  Significance.  Health department reportedly notified of urine culture results ?Diarrhea definitely significantly improved compared to admission.  Now having varying volumes of stool.  Consistency has improved to some BMs with soft stools and some still watery.  Has issues with urgency and at times unable to even make it to the bathroom, incontinence. ?Discussed with ID MD on-call and since its  been several days out, okay to try couple doses of Imodium and see if it helps her diarrhea. ? ?Paroxysmal a-fib ?Usually in NSR: 1 episode post op in 2011, had 2nd episode that brought her into ED in Fall 2022. ?Continue Xarelto ?Toprol-XL and Cardizem which were held on admission due to soft blood pressures have been restarted and tolerating. ? ?Essential hypertension ?Back on home dose of Toprol-XL and Cardizem.  Blood pressures controlled. ? ?Hypokalemia ?Secondary to GI losses and poor oral intake. ?Replaced.  Magnesium 1.7, replacing magnesium IV. ?Follow BMP in a.m. ? ?Liver lesion, right lobe ?Incidental finding on CT abdomen as noted below ?There is 6 mm low-density lesion in the right lobe of liver suggesting possible cyst or hemangioma. ?Outpatient follow-up as deemed necessary. ? ? ?Body mass index is 33.37 kg/m?. ? ? ?DVT prophylaxis:   On Xarelto ?  Code Status: Full Code:  ?Family Communication: Discussed with 2 sons at bedside. ?Disposition:  ?Home pending clinical improvement of stool urgency and incontinence ?  ? ?Consultants:   ?Discussed with infectious disease MD on call on 4/5, 4/6 and 4/8 ? ?Procedures:   ? ? ?Antimicrobials:   ?None. ? ? ?Subjective:  ?Diarrhea significantly improved since admission including frequency, volume and consistency but having issues with urgency, unable to consistently make it to the toilet leading to incontinence and soiling.  Tolerating diet.  Feels weak.  One of the sons indicated that the health department did call him yesterday. ? ?Objective:  ? ?Vitals:  ? 04/17/21 1210 04/17/21 2033 04/17/21 2132 04/18/21 0419  ?BP: 136/66 (!) 134/59  (!) 146/55  ?Pulse: 75  79  64  ?Resp: '18 17  16  '$ ?Temp: 98.2 ?F (36.8 ?C) 98.3 ?F (36.8 ?C)  98.3 ?F (36.8 ?C)  ?TempSrc: Oral Oral  Oral  ?SpO2: 99% 97% 99% 94%  ?Weight:      ?Height:      ? ? ?General exam: Female, moderately built and nourished lying comfortably propped up in bed without distress.  Oral mucosa moist.  Does  not look septic or toxic. ?Respiratory system: Clear to auscultation. Respiratory effort normal. ?Cardiovascular system: S1 & S2 heard, RRR. No JVD, murmurs, rubs, gallops or clicks. No pedal edema.   ?Gastrointestinal system: Abdomen is nondistended, soft and nontender.  No organomegaly or masses appreciated.  Normal bowel sounds heard. ?Central nervous system: Alert and oriented. No focal neurological deficits. ?Extremities: Symmetric 5 x 5 power. ?Skin: No rashes, lesions or ulcers ?Psychiatry: Judgement and insight appear normal. Mood & affect appropriate.  ? ?Data Reviewed:   ?I have personally reviewed following labs and imaging studies ? ?CBC: ?Recent Labs  ?Lab 04/13/21 ?1757 04/14/21 ?0205 04/16/21 ?0341  ?WBC 10.8* 8.6 6.6  ?HGB 15.1* 14.0 12.6  ?HCT 45.4 40.8 37.1  ?MCV 92.8 94.2 91.8  ?PLT 199 176 173  ? ?Basic Metabolic Panel: ?Recent Labs  ?Lab 04/13/21 ?1757 04/14/21 ?0205 04/15/21 ?9030 04/16/21 ?0923 04/17/21 ?3007  ?NA 133* 136 135 137 137  ?K 4.4 4.0 3.0* 4.2 4.7  ?CL 102 109 109 108 109  ?CO2 18* 18* 19* 21* 22  ?GLUCOSE 116* 108* 87 76 88  ?BUN 31* 24* '21 14 10  '$ ?CREATININE 0.97 0.86 0.72 0.75 0.66  ?CALCIUM 9.4 7.9* 7.6* 7.9* 8.4*  ?MG  --   --  1.7 1.7 1.9  ? ?Liver Function Tests: ?Recent Labs  ?Lab 04/13/21 ?1757  ?AST 48*  ?ALT 30  ?ALKPHOS 56  ?BILITOT 0.9  ?PROT 7.1  ?ALBUMIN 4.5  ? ?CBG: ?No results for input(s): GLUCAP in the last 168 hours. ?Microbiology Studies:  ? ?Recent Results (from the past 240 hour(s))  ?Blood Culture (routine x 2)     Status: None  ? Collection Time: 04/13/21  6:08 PM  ? Specimen: BLOOD  ?Result Value Ref Range Status  ? Specimen Description   Final  ?  BLOOD BLOOD RIGHT FOREARM ?Performed at [facility], [address] [place], [place] [address] ?  ? Special Requests   Final  ?  Blood Culture adequate volume BOTTLES DRAWN AEROBIC AND ANAEROBIC ?Performed at [facility], [address] [place], [place] [address] ?  ?  Culture   Final  ?  NO GROWTH 5 DAYS ?Performed at [facility], [place] [address] [place], [place] [address] ?  ? Report Status 04/18/2021 FINAL  Final  ?Blood Culture (routine x 2)     Status: None  ? Collection Time: 04/13/21  6:34 PM  ? Specimen: BLOOD  ?Result Value Ref Range Status  ? Specimen Description   Final  ?  BLOOD BLOOD RIGHT FOREARM ?Performed at [facility], [address] [place], [place] [address] ?  ? Special Requests   Final  ?  Blood Culture adequate volume BOTTLES DRAWN AEROBIC AND ANAEROBIC ?Performed at [facility], [address] [place], [place] [address] ?  ? Culture   Final  ?  NO GROWTH 5 DAYS ?Performed at [facility], [place] [address] [place],  [address] ?  ? Report Status 04/18/2021 FINAL  Final  ?Urine Culture  Status: Abnormal (Preliminary result)  ? Collection Time: 04/13/21  6:34 PM  ? Specimen: In/Out Cath Urine  ?Result Value Ref Range Status  ? Specimen Description   Final  ?  IN/OUT CATH URINE ?Performed at [facility], [address] [place], [place] [address] ?  ? Special Requests   Final  ?  NONE ?Performed at [facility], [address], [place], [place] [address] ?  ? Culture (A)  Final  ?  20,000 COLONIES/mL SALMONELLA SPECIES ?60,000 COLONIES/mL ENTEROCOCCUS FAECALIS ?HEALTH DEPARTMENT NOTIFIED ?SENT TO STATE LAB FOR CONFIRMATION ?Performed at [facility], [place] [address], [place], [place] [address] ?  ? Report Status PENDING  Incomplete  ? Organism ID, Bacteria SALMONELLA SPECIES (A)  Final  ? Organism ID, Bacteria ENTEROCOCCUS FAECALIS (A)  Final  ?    Susceptibility  ? Enterococcus faecalis - MIC*  ?  AMPICILLIN <=2 SENSITIVE Sensitive   ?  NITROFURANTOIN <=16 SENSITIVE Sensitive   ?  VANCOMYCIN 1 SENSITIVE Sensitive   ?  * 60,000 COLONIES/mL ENTEROCOCCUS FAECALIS  ? Salmonella species - MIC*  ?  AMPICILLIN <=2 SENSITIVE Sensitive   ?  LEVOFLOXACIN <=0.12  SENSITIVE Sensitive   ?  TRIMETH/SULFA <=20 SENSITIVE Sensitive   ?  * 20,000 COLONIES/mL SALMONELLA SPECIES  ?Resp Panel by RT-PCR (Flu A&B, Covid) Nasopharyngeal Swab     Status: None  ? Collection

## 2021-04-18 NOTE — Discharge Instructions (Addendum)
Information on my medicine - XARELTO? (Rivaroxaban) ? ?Why was Xarelto? prescribed for you? ?Xarelto? was prescribed for you to reduce the risk of a blood clot forming that can cause a stroke if you have a medical condition called atrial fibrillation (a type of irregular heartbeat). ? ?What do you need to know about xarelto? ? ?Take your Xarelto? ONCE DAILY at the same time every day with your evening meal. ?If you have difficulty swallowing the tablet whole, you may crush it and mix in applesauce just prior to taking your dose. ? ?Take Xarelto? exactly as prescribed by your doctor and DO NOT stop taking Xarelto? without talking to the doctor who prescribed the medication.  Stopping without other stroke prevention medication to take the place of Xarelto? may increase your risk of developing a clot that causes a stroke.  Refill your prescription before you run out. ? ?After discharge, you should have regular check-up appointments with your healthcare provider that is prescribing your Xarelto?Deborah Mcmillan  In the future your dose may need to be changed if your kidney function or weight changes by a significant amount. ? ?What do you do if you miss a dose? ?If you are taking Xarelto? ONCE DAILY and you miss a dose, take it as soon as you remember on the same day then continue your regularly scheduled once daily regimen the next day. Do not take two doses of Xarelto? at the same time or on the same day.  ? ?Important Safety Information ?A possible side effect of Xarelto? is bleeding. You should call your healthcare provider right away if you experience any of the following: ?Bleeding from an injury or your nose that does not stop. ?Unusual colored urine (red or dark brown) or unusual colored stools (red or black). ?Unusual bruising for unknown reasons. ?A serious fall or if you hit your head (even if there is no bleeding). ? ?Some medicines may interact with Xarelto? and might increase your risk of bleeding while on Xarelto?Deborah Mcmillan To  help avoid this, consult your healthcare provider or pharmacist prior to using any new prescription or non-prescription medications, including herbals, vitamins, non-steroidal anti-inflammatory drugs (NSAIDs) and supplements. ? ?This website has more information on Xarelto?: [email or web address].[email or web address] ? ?Additional Discharge Instructions ? ? ?Please get your medications reviewed and adjusted by your Primary MD. ? ?Please request your Primary MD to go over all Hospital Tests and Procedure/Radiological results at the follow up, please get all Hospital records sent to your Prim MD by signing hospital release before you go home. ? ?If you had Pneumonia of Lung problems at the Hospital: ?Please get a 2 view Chest X ray done in approximately 4 weeks after hospital discharge or sooner if instructed by your Primary MD. ? ?If you have Congestive Heart Failure: ?Please call your Cardiologist or Primary MD anytime you have any of the following symptoms:  ?1) 3 pound weight gain in 24 hours or 5 pounds in 1 week  ?2) shortness of breath, with or without a dry hacking cough  ?3) swelling in the hands, feet or stomach  ?4) if you have to sleep on extra pillows at night in order to breathe ? ?Follow cardiac low salt diet and 1.5 lit/day fluid restriction. ? ?If you have diabetes ?Accuchecks 4 times/day, Once in AM empty stomach and then before each meal. ?Log in all results and show them to your primary doctor at your next visit. ?If any glucose reading is under 80 or above 300 call your primary MD  immediately. ? ?If you have Seizure/Convulsions/Epilepsy: ?Please do not drive, operate heavy machinery, participate in activities at heights or participate in high speed sports until you have seen by Primary MD or a Neurologist and advised to do so again. ?Per [facility] statutes, patients with seizures are not allowed to drive until they have been seizure-free for six months.  ?Use caution when using heavy equipment or power tools.  Avoid working on ladders or at heights. Take showers instead of baths. Ensure the water temperature is not too high on the home water heater. Do not go swimming alone. Do not lock yourself in a room alone (i.e. bathroom). When caring for infants or small children, sit down when holding, feeding, or changing them to minimize risk of injury to the child in the event you have a seizure. Maintain good sleep hygiene. Avoid alcohol.  ? ?If you had Gastrointestinal Bleeding: ?Please ask your Primary MD to check a complete blood count within one week of discharge or at your next visit. Your endoscopic/colonoscopic biopsies that are pending at the time of discharge, will also need to followed by your Primary MD. ? ?Get Medicines reviewed and adjusted. ?Please take all your medications with you for your next visit with your Primary MD ? ?Please request your Primary MD to go over all hospital tests and procedure/radiological results at the follow up, please ask your Primary MD to get all Hospital records sent to his/her office. ? ?If you experience worsening of your admission symptoms, develop shortness of breath, life threatening emergency, suicidal or homicidal thoughts you must seek medical attention immediately by calling 911 or calling your MD immediately  if symptoms less severe. ? ?You must read complete instructions/literature along with all the possible adverse reactions/side effects for all the Medicines you take and that have been prescribed to you. Take any new Medicines after you have completely understood and accpet all the possible adverse reactions/side effects.  ? ?Do not drive or operate heavy machinery when taking Pain medications.  ? ?Do not take more than prescribed Pain, Sleep and Anxiety Medications ? ?Special Instructions: If you have smoked or chewed Tobacco  in the last 2 yrs please stop smoking, stop any regular Alcohol  and or any Recreational drug use. ? ?Wear Seat belts while driving. ? ?Please  note ?You were cared for by a hospitalist during your hospital stay. If you have any questions about your discharge medications or the care you received while you were in the hospital after you are discharged, you can call the unit and asked to speak with the hospitalist on call if the hospitalist that took care of you is not available. Once you are discharged, your primary care physician will handle any further medical issues. Please note that NO REFILLS for any discharge medications will be authorized once you are discharged, as it is imperative that you return to your primary care physician (or establish a relationship with a primary care physician if you do not have one) for your aftercare needs so that they can reassess your need for medications and monitor your lab values. ? ?You can reach the hospitalist office at phone [phone] or fax [phone] ?  ?If you do not have a primary care physician, you can call [phone] for a physician referral. ? ? ?

## 2021-04-19 DIAGNOSIS — A020 Salmonella enteritis: Secondary | ICD-10-CM | POA: Diagnosis not present

## 2021-04-19 DIAGNOSIS — E876 Hypokalemia: Secondary | ICD-10-CM | POA: Diagnosis not present

## 2021-04-19 LAB — BASIC METABOLIC PANEL
Anion gap: 9 (ref 5–15)
BUN: 12 mg/dL (ref 8–23)
CO2: 24 mmol/L (ref 22–32)
Calcium: 9.1 mg/dL (ref 8.9–10.3)
Chloride: 104 mmol/L (ref 98–111)
Creatinine, Ser: 0.74 mg/dL (ref 0.44–1.00)
GFR, Estimated: >60 mL/min (ref >60)
Glucose, Bld: 98 mg/dL (ref 70–99)
Potassium: 3.8 mmol/L (ref 3.5–5.1)
Sodium: 137 mmol/L (ref 135–145)

## 2021-04-19 MED ORDER — FLUTICASONE PROPIONATE 50 MCG/ACT NA SUSP: 1.0000 | Freq: Every day | NASAL | Status: DC | PRN

## 2021-04-19 MED ORDER — LOPERAMIDE HCL 2 MG PO CAPS: 2.0000 mg | ORAL_CAPSULE | Freq: Three times a day (TID) | ORAL | Status: DC | PRN

## 2021-04-19 MED ORDER — DILTIAZEM HCL ER COATED BEADS 120 MG PO CP24: 120.0000 mg | ORAL_CAPSULE | Freq: Every day | ORAL | Status: DC

## 2021-04-19 NOTE — Plan of Care (Signed)
?  Problem: Education: ?Goal: Knowledge of General Education information will improve ?Description: Including pain rating scale, medication(s)/side effects and non-pharmacologic comfort measures ?Outcome: Adequate for Discharge ?  ?Problem: Health Behavior/Discharge Planning: ?Goal: Ability to manage health-related needs will improve ?Outcome: Adequate for Discharge ?  ?Problem: Clinical Measurements: ?Goal: Ability to maintain clinical measurements within normal limits will improve ?Outcome: Adequate for Discharge ?Goal: Will remain free from infection ?Outcome: Adequate for Discharge ?Goal: Diagnostic test results will improve ?Outcome: Adequate for Discharge ?Goal: Respiratory complications will improve ?Outcome: Adequate for Discharge ?Goal: Cardiovascular complication will be avoided ?Outcome: Adequate for Discharge ?  ?Problem: Elimination: ?Goal: Will not experience complications related to bowel motility ?Outcome: Adequate for Discharge ?Goal: Will not experience complications related to urinary retention ?Outcome: Adequate for Discharge ?  ?Problem: Pain Managment: ?Goal: General experience of comfort will improve ?Outcome: Adequate for Discharge ?  ?Problem: Nutrition ?Goal: Patient maintains adequate hydration ?Outcome: Adequate for Discharge ?  ?

## 2021-04-19 NOTE — Plan of Care (Signed)
?  Problem: Education: ?Goal: Knowledge of General Education information will improve ?Description: Including pain rating scale, medication(s)/side effects and non-pharmacologic comfort measures ?Outcome: Progressing ?  ?Problem: Health Behavior/Discharge Planning: ?Goal: Ability to manage health-related needs will improve ?Outcome: Progressing ?  ?Problem: Clinical Measurements: ?Goal: Ability to maintain clinical measurements within normal limits will improve ?Outcome: Progressing ?Goal: Will remain free from infection ?Outcome: Progressing ?Goal: Diagnostic test results will improve ?Outcome: Progressing ?Goal: Respiratory complications will improve ?Outcome: Progressing ?Goal: Cardiovascular complication will be avoided ?Outcome: Progressing ?  ?Problem: Elimination: ?Goal: Will not experience complications related to bowel motility ?Outcome: Progressing ?Goal: Will not experience complications related to urinary retention ?Outcome: Progressing ?  ?Problem: Pain Managment: ?Goal: General experience of comfort will improve ?Outcome: Progressing ?  ?Problem: Nutrition ?Goal: Patient maintains adequate hydration ?Outcome: Progressing ?  ?

## 2021-04-19 NOTE — Discharge Summary (Signed)
Physician Discharge Summary  ?[place] L Cabello FKC:[id number] DOB: [birth year] ? ?PCP: Deland Pretty, MD ? ?Admitted from: Home ?Discharged to: Home ? ?Admit date: 04/13/2021 ?Discharge date: 04/19/2021 ? ?Recommendations for Outpatient Follow-up:  ? ? Follow-up Information   ? ? Deland Pretty, MD. Schedule an appointment as soon as possible for a visit in 1 week(s).   ?Specialty: Internal Medicine ?Why: To be seen with repeat labs (CBC & BMP). ?Contact information: ?[place] [address] ?[phone] ? ? ?  ?  ? ?  ?  ? ?  ? ? ?Home Health: None ?  ? ?Equipment/Devices: None ?  ? ?Discharge Condition: Improved and stable ?  Code Status: Full Code ?Diet recommendation:  ?Discharge Diet Orders (From admission, onward)  ? ?  Start     Ordered  ? 04/19/21 0000  Diet - low sodium heart healthy       ? 04/19/21 1131  ? ?  ?  ? ?  ?  ? ?Discharge Diagnoses:  ?Principal Problem: ?  Salmonella gastroenteritis ?Active Problems: ?  Paroxysmal a-fib ?  Essential hypertension ?  Hypokalemia ?  Liver lesion, right lobe ?  Nausea and vomiting ? ? ?Brief Hospital Course: ?87 year old female, lives with her son, medical history significant for HTN, PAF on Xarelto, HLD, GERD, asthma/COPD, multiple medication intolerances, presented to ED with complaints of fever, nausea, vomiting, profuse diarrhea, poor oral intake which started approximately 24 to 36 hours after a meal at [employer] express containing shrimp and beef.  Stool C. difficile negative but positive for Salmonella.  Admitted for Salmonella acute GE.  Treating supportively.  Slowly improving but still had some urgency and incontinence issues and quite reluctant to go home until those have improved.  Improved after a trial of Imodium. ? ?Assessment and Plan: ?* Salmonella gastroenteritis ?Started approximately 24 to 36 hours after a meal at [facility] express containing shrimp and beef.   ?Stool C. difficile negative but positive for Salmonella.  ?Treated  supportively with diet as tolerated, IV fluids.  IV fluids discontinued since she is able to tolerate p.o. and reports urinary frequency ?Enteric isolation ?As per communication with ID MD, managed conservatively and without antibiotics. ?Urine culture shows 20 K colonies of Salmonella species and 60 K colonies of Enterococcus faecalis species.  Unclear why urine culture was sent.?  Significance.  Health department reportedly notified of urine culture results ?Diarrhea definitely significantly improved compared to admission.  However over the last 2 days had issues with varying volumes of stools, consistency had improved to some BMs with soft stools and some still watery but her main issue was urgency and at times unable to control until going to the bathroom and stool incontinence.  ?Discussed with ID MD on-call and since its been several days out, okay to try couple doses of Imodium and see if it helps her diarrhea. ?Improved after trial of Imodium.  No BM overnight.  Urine frequency has much reduced.  Tolerating regular diet without nausea or vomiting.  Ambulating in room without difficulty and patient ready to go home. ?Patient states that her son has already gotten her OTC Imodium and she was advised to follow instructions and do not take it more than recommended dose on the package. ? ?Paroxysmal a-fib ?Usually in NSR: 1 episode post op in 2011, had 2nd episode that brought her into ED in Fall 2022. ?Continue Xarelto ?Toprol-XL and Cardizem which were held on admission due to soft blood pressures have  been restarted and tolerating. ? ?Essential hypertension ?Back on home dose of Toprol-XL and Cardizem.  Blood pressures controlled. ?Resume home dose of Aldactone at discharge.  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 ? ?Hypokalemia ?Secondary to GI losses and poor oral intake. ?Replaced.  Magnesium 1.7, replacing magnesium IV. ?Follow BMP in a.m. ? ?Liver lesion, right lobe ?Incidental finding on CT abdomen as  noted below ?There is 6 mm low-density lesion in the right lobe of liver suggesting possible cyst or hemangioma. ?Outpatient follow-up as deemed necessary. ? ? ?Body mass index is 33.37 kg/m?. ? ? ?Consultations: ?None ? ?Procedures: ?None ? ?Discharge Instructions ?Discharge Instructions   ? ? Call MD for:   Complete by: As directed ?  ? Recurrent or worsening diarrhea.  ? Call MD for:  difficulty breathing, headache or visual disturbances   Complete by: As directed ?  ? Call MD for:  extreme fatigue   Complete by: As directed ?  ? Call MD for:  persistant dizziness or light-headedness   Complete by: As directed ?  ? Call MD for:  persistant nausea and vomiting   Complete by: As directed ?  ? Call MD for:  severe uncontrolled pain   Complete by: As directed ?  ? Call MD for:  temperature >100.4   Complete by: As directed ?  ? Diet - low sodium heart healthy   Complete by: As directed ?  ? Increase activity slowly   Complete by: As directed ?  ? ?  ? ?  ?Medication List  ?  ? ?STOP taking these medications   ? ?Stiolto Respimat 2.5-2.5 MCG/ACT Aers ?Generic drug: Tiotropium Bromide-Olodaterol ?  ?traMADol 50 MG tablet ?Commonly known as: Ultram ?  ? ?  ? ?TAKE these medications   ? ?albuterol 108 (90 Base) MCG/ACT inhaler ?Commonly known as: VENTOLIN HFA ?Inhale 1 puff into the lungs every 4 (four) hours as needed for wheezing or shortness of breath. ?  ?budesonide-formoterol 160-4.5 MCG/ACT inhaler ?Commonly known as: SYMBICORT ?Inhale 2 puffs into the lungs 2 (two) times daily. ?  ?denosumab 60 MG/ML Sosy injection ?Commonly known as: PROLIA ?Inject 60 mg into the skin every 6 (six) months. ?  ?diltiazem 120 MG 24 hr capsule ?Commonly known as: Cardizem CD ?Take 1 capsule (120 mg total) by mouth daily with supper. ?  ?fluticasone 50 MCG/ACT nasal spray ?Commonly known as: FLONASE ?Place 1 spray into both nostrils daily as needed for allergies or rhinitis. ?What changed:  ?how much to take ?how to take this ?when  to take this ?reasons to take this ?additional instructions ?  ?loperamide 2 MG capsule ?Commonly known as: IMODIUM ?Take 1 capsule (2 mg total) by mouth every 8 (eight) hours as needed for diarrhea or loose stools (For 3 to 5 days.  If diarrhea recurs or worsens, follow-up with your family physician.). ?  ?MAGNESIUM CITRATE PO ?Take 1 tablet by mouth in the morning. ?  ?metoprolol succinate 100 MG 24 hr tablet ?Commonly known as: TOPROL-XL ?TAKE 1 TABLET(100 MG) BY MOUTH EVERY EVENING ?  ?multivitamin with minerals Tabs tablet ?Take 1 tablet by mouth daily. ?  ?omeprazole 20 MG capsule ?Commonly known as: PRILOSEC ?Take 20 mg by mouth in the morning. ?  ?Praluent 75 MG/ML Soaj ?Generic drug: Alirocumab ?INJECT 75 MG UNDER THE SKIN EVERY 14 DAYS ?  ?rivaroxaban 20 MG Tabs tablet ?Commonly known as: XARELTO ?Take 1 tablet (20 mg total) by mouth daily with supper. ?  ?spironolactone 50 MG tablet ?  Commonly known as: ALDACTONE ?TAKE 1 TABLET BY MOUTH EVERY MORNING ?  ?Vitamin D 50 MCG (2000 UT) Caps ?Take 2,000 Units by mouth daily. ?  ? ?  ? ?Allergies  ?Allergen Reactions  ? Calcium-Containing Compounds Other (See Comments)  ?  Muscle cramping  ? Magnesium-Containing Compounds Itching and Rash  ? Tape Itching and Rash  ?  Plastic, and "brown tape"  ? Nitrofurantoin Other (See Comments)  ?  Unknown  ? Cefuroxime Axetil Other (See Comments)  ?  leg pain  ? Codeine Nausea Only  ?  Pain & upset stomach  ? Crestor [Rosuvastatin] Other (See Comments)  ?  Leg cramping  ? Lodine [Etodolac] Other (See Comments)  ?  Upset stomach  ? Niacin And Related Rash  ?  "on fire"  ? Penicillins Itching, Swelling and Rash  ?  She reports some swelling and itchy.   ? Reclast [Zoledronic Acid] Other (See Comments)  ?  Caused severe jaw pain  ? Sulfa Antibiotics Other (See Comments)  ?  Pain & upset stomach  ? Tetracyclines & Related Rash  ?  Upset stomach  ? Welchol [Colesevelam] Nausea Only and Other (See Comments)  ?  Stomach did not feel  good  ? Zetia [Ezetimibe] Other (See Comments)  ?  Muscle pain  ? ?Procedures/Studies: ?CT ABDOMEN PELVIS W CONTRAST ? ?Result Date: 04/13/2021 ?CLINICAL DATA:  Pain right lower quadrant EXAM: CT ABDOMEN AND P

## 2021-04-21 NOTE — Telephone Encounter (Signed)
Seems like encounter was open in error so closing encounter.  

## 2021-04-30 DIAGNOSIS — A020 Salmonella enteritis: Secondary | ICD-10-CM | POA: Diagnosis not present

## 2021-04-30 DIAGNOSIS — R110 Nausea: Secondary | ICD-10-CM | POA: Diagnosis not present

## 2021-04-30 DIAGNOSIS — Z09 Encounter for follow-up examination after completed treatment for conditions other than malignant neoplasm: Secondary | ICD-10-CM | POA: Diagnosis not present

## 2021-04-30 DIAGNOSIS — E876 Hypokalemia: Secondary | ICD-10-CM | POA: Diagnosis not present

## 2021-05-12 LAB — MISCELLANEOUS TEST

## 2021-05-19 ENCOUNTER — Ambulatory Visit (INDEPENDENT_AMBULATORY_CARE_PROVIDER_SITE_OTHER): Payer: Medicare Other | Admitting: Pulmonary Disease

## 2021-05-19 ENCOUNTER — Encounter: Payer: Self-pay | Admitting: Pulmonary Disease

## 2021-05-19 VITALS — BP 118/64 | HR 61 | Temp 98.1°F | Ht 60.0 in | Wt 169.0 lb

## 2021-05-19 DIAGNOSIS — R0609 Other forms of dyspnea: Secondary | ICD-10-CM | POA: Diagnosis not present

## 2021-05-19 DIAGNOSIS — J4530 Mild persistent asthma, uncomplicated: Secondary | ICD-10-CM | POA: Diagnosis not present

## 2021-05-19 NOTE — Progress Notes (Signed)
? ?'@Patient'$  ID: Deborah Mcmillan, female    DOB: 1934-06-03, 87 y.o.   MRN: [id number] ? ?Chief Complaint  ?Patient presents with  ? Follow-up  ?  Pt states that she is still having issues with her voice with the Symbicort. She states that her insurance will not fill the Stilito at all. Pt states she still feels sometimes that she is weak and not getting enough air. Was in the hospital last month.   ? ? ?Referring provider: ?Deborah Pretty, MD ? ?HPI:  ? ?87 y.o. woman whom we are seeing in follow up for evaluation of dyspnea on exertion.  Multiple telephone encounters involving office staff regarding medication issues reviewed. Recent discharge summary reviewed.  ? ?Returns for follow-up.   Ongoing issues with adverse events, side effects due to ICS/LABA inhalers.  We tried discontinuing or decreasing frequency of Symbicort.  This resulted in worsening shortness of breath, dyspnea.  Resumed Symbicort with improved dyspnea.  However, return of hoarseness of voice.  Very weak voice.  No mouth ulcers this time.  She was hospitalized early 04/2021 with GI illness.  Nausea vomiting diarrhea.  Gradually improved.  Found to have Salmonella on GI pathogen panel.  Has recovered from this and doing well at home otherwise. ? ? ?HPI initial visit ?Patient has chronic dyspnea on exertion over the last couple years or so.  Maybe slight worsening over the last few months.  She thinks she had COVID prior to reliable testing in late 2019.  She again had in the fall 2020.  Since then her breathing has been worse, dyspnea on exertion.  Again had COVID early 2022.  Breathing worse with stairs or inclines.  Can be present over longer distance flat surface.  Relieved by rest.  Using Asmanex for her asthma.  This is not made much change in her dyspnea.  No timing during the day where things are better or worse.  No environmental factors to account for the change.  She does note seasonal allergies and may be breathing a bit worse when pollen  in the spring.  Has been more congested over the last few weeks with pollen season.  No other alleviating or exacerbating factors.  Dyspnea described as moderate.  She does note she is much less active over the last year or 2 compared to prepandemic.  More times indoors.  Also notes mild 10 to 15 pound weight gain. ? ?Chest x-ray most recent chest imaging in 2019 reviewed interpreted as clear lungs, no effusion, infiltrate, pneumothorax. ? ?PMH: Asthma, hypertension, GERD ?Surgical history: Appendectomy, shoulder surgery, bladder surgery, ?Family history: Mother brother and sister with various forms of cancer, no pulmonary disease in first-degree relatives ?Social history: Never smoker, retired, lives in [place] ? ?Questionaires / Pulmonary Flowsheets:  ? ?ACT:  ?Asthma Control Test ACT Total Score  ?05/19/2021 ?11:37 AM 20  ?11/18/2020 ? 1:32 PM 19  ? ? ?MMRC: ?   ? View : No data to display.  ?  ?  ?  ? ? ?Epworth:  ?   ? View : No data to display.  ?  ?  ?  ? ? ?Tests:  ? ?FENO:  ?No results found for: NITRICOXIDE ? ?PFT: ? ?  Latest Ref Rng & Units 09/24/2020  ?  3:51 PM  ?PFT Results  ?FVC-Pre L 1.68    ?FVC-Predicted Pre % 85    ?FVC-Post L 1.71    ?FVC-Predicted Post % 87    ?Pre FEV1/FVC % %  76    ?Post FEV1/FCV % % 80    ?FEV1-Pre L 1.28    ?FEV1-Predicted Pre % 89    ?FEV1-Post L 1.38    ?DLCO uncorrected ml/min/mmHg 14.17    ?DLCO UNC% % 87    ?DLCO corrected ml/min/mmHg 13.31    ?DLCO COR %Predicted % 82    ?DLVA Predicted % 95    ?Personally reviewed and interpreted as normal spirometry.  No bronchodilator response.  DLCO within normal limits. ? ?WALK:  ?   ? View : No data to display.  ?  ?  ?  ? ? ?Imaging: ?Personally reviewed and as per EMR discussion this note ? ? ?Lab Results: ?Personally reviewed, eosinophils 200 8/22 ?CBC ?   ?Component Value Date/Time  ? WBC 6.6 04/16/2021 0341  ? RBC 4.04 04/16/2021 0341  ? HGB 12.6 04/16/2021 0341  ? HCT 37.1 04/16/2021 0341  ? PLT 173 04/16/2021 0341  ? MCV  91.8 04/16/2021 0341  ? MCH 31.2 04/16/2021 0341  ? MCHC 34.0 04/16/2021 0341  ? RDW 13.4 04/16/2021 0341  ? LYMPHSABS 3.7 02/19/2021 1148  ? MONOABS 1.0 02/19/2021 1148  ? EOSABS 0.2 02/19/2021 1148  ? BASOSABS 0.1 02/19/2021 1148  ? ? ?BMET ?   ?Component Value Date/Time  ? NA 137 04/19/2021 0358  ? K 3.8 04/19/2021 0358  ? CL 104 04/19/2021 0358  ? CO2 24 04/19/2021 0358  ? GLUCOSE 98 04/19/2021 0358  ? BUN 12 04/19/2021 0358  ? CREATININE 0.74 04/19/2021 0358  ? CALCIUM 9.1 04/19/2021 0358  ? GFRNONAA >60 04/19/2021 0358  ? GFRAA >60 12/25/2017 0600  ? ? ?BNP ?No results found for: BNP ? ?ProBNP ?No results found for: PROBNP ? ?Specialty Problems   ?None ? ?Allergies  ?Allergen Reactions  ? Calcium-Containing Compounds Other (See Comments)  ?  Muscle cramping  ? Magnesium-Containing Compounds Itching and Rash  ? Tape Itching and Rash  ?  Plastic, and "brown tape"  ? Nitrofurantoin Other (See Comments)  ?  Unknown  ? Cefuroxime Axetil Other (See Comments)  ?  leg pain  ? Codeine Nausea Only  ?  Pain & upset stomach  ? Crestor [Rosuvastatin] Other (See Comments)  ?  Leg cramping  ? Lodine [Etodolac] Other (See Comments)  ?  Upset stomach  ? Niacin And Related Rash  ?  "on fire"  ? Penicillins Itching, Swelling and Rash  ?  She reports some swelling and itchy.   ? Reclast [Zoledronic Acid] Other (See Comments)  ?  Caused severe jaw pain  ? Sulfa Antibiotics Other (See Comments)  ?  Pain & upset stomach  ? Tetracyclines & Related Rash  ?  Upset stomach  ? Welchol [Colesevelam] Nausea Only and Other (See Comments)  ?  Stomach did not feel good  ? Zetia [Ezetimibe] Other (See Comments)  ?  Muscle pain  ? ? ?Immunization History  ?Administered Date(s) Administered  ? DTP 05/13/2007  ? Fluad Quad(high Dose 65+) 09/22/2018  ? IPV 02/24/2021  ? Influenza Split 10/14/2012  ? Influenza, High Dose Seasonal PF 10/14/2012, 09/04/2014, 11/04/2015  ? Influenza, Quadrivalent, Recombinant, Inj, Pf 10/19/2017, 09/22/2018,  10/01/2020  ? Influenza-Unspecified 09/27/2013, 10/11/2017  ? PFIZER(Purple Top)SARS-COV-2 Vaccination 03/29/2019, 04/23/2019, 01/01/2020  ? Pneumococcal Polysaccharide-23 02/28/2004  ? Td 10/11/2007  ? Tdap 05/13/2011  ? Zoster, Unspecified 07/10/2010  ? ? ?Past Medical History:  ?Diagnosis Date  ? Arthritis   ? Asthma   ? Complication of anesthesia   ?  difficult to wake up and agitation  ? Dyslipidemia   ? Dysrhythmia   ? h/o atrial couplets with short PAT, h/o a-fib during surgery (event monitor)  ? GERD (gastroesophageal reflux disease)   ? History of nuclear stress test 05/11/2009  ? dipyridamole; normal, low risk   ? Hypertension   ? Osteoporosis   ? Pneumonia   ? PONV (postoperative nausea and vomiting)   ? Postoperative atrial fibrillation ([place]) 01/11/2009  ? Seasonal allergies   ? ? ?Tobacco History: ?Social History  ? ?Tobacco Use  ?Smoking Status Never  ?Smokeless Tobacco Never  ? ?Counseling given: Not Answered ? ? ?Continue to not smoke ? ?Outpatient Encounter Medications as of 05/19/2021  ?Medication Sig  ? albuterol (VENTOLIN HFA) 108 (90 Base) MCG/ACT inhaler Inhale 1 puff into the lungs every 4 (four) hours as needed for wheezing or shortness of breath.  ? budesonide-formoterol (SYMBICORT) 160-4.5 MCG/ACT inhaler Inhale 2 puffs into the lungs 2 (two) times daily.  ? Cholecalciferol (VITAMIN D) 50 MCG (2000 UT) CAPS Take 2,000 Units by mouth daily.  ? denosumab (PROLIA) 60 MG/ML SOSY injection Inject 60 mg into the skin every 6 (six) months.  ? diltiazem (CARDIZEM CD) 120 MG 24 hr capsule Take 1 capsule (120 mg total) by mouth daily with supper.  ? fluticasone (FLONASE) 50 MCG/ACT nasal spray Place 1 spray into both nostrils daily as needed for allergies or rhinitis.  ? loperamide (IMODIUM) 2 MG capsule Take 1 capsule (2 mg total) by mouth every 8 (eight) hours as needed for diarrhea or loose stools (For 3 to 5 days.  If diarrhea recurs or worsens, follow-up with your family physician.).  ? MAGNESIUM  CITRATE PO Take 1 tablet by mouth in the morning.  ? metoprolol succinate (TOPROL-XL) 100 MG 24 hr tablet TAKE 1 TABLET(100 MG) BY MOUTH EVERY EVENING  ? Multiple Vitamin (MULTIVITAMIN WITH MINERALS) TABS tabl

## 2021-05-25 LAB — URINE CULTURE: Culture: 20000 — AB

## 2021-06-08 ENCOUNTER — Emergency Department (HOSPITAL_BASED_OUTPATIENT_CLINIC_OR_DEPARTMENT_OTHER): Payer: Medicare Other | Admitting: Radiology

## 2021-06-08 ENCOUNTER — Emergency Department (HOSPITAL_BASED_OUTPATIENT_CLINIC_OR_DEPARTMENT_OTHER)
Admission: EM | Admit: 2021-06-08 | Discharge: 2021-06-08 | Disposition: A | Discharge status: Home / Self Care | Payer: Medicare Other | Attending: Emergency Medicine | Admitting: Emergency Medicine

## 2021-06-08 ENCOUNTER — Other Ambulatory Visit: Payer: Self-pay

## 2021-06-08 ENCOUNTER — Encounter (HOSPITAL_BASED_OUTPATIENT_CLINIC_OR_DEPARTMENT_OTHER): Payer: Self-pay | Admitting: Emergency Medicine

## 2021-06-08 DIAGNOSIS — J45909 Unspecified asthma, uncomplicated: Secondary | ICD-10-CM | POA: Diagnosis not present

## 2021-06-08 DIAGNOSIS — R0602 Shortness of breath: Secondary | ICD-10-CM | POA: Diagnosis not present

## 2021-06-08 DIAGNOSIS — Z7951 Long term (current) use of inhaled steroids: Secondary | ICD-10-CM | POA: Diagnosis not present

## 2021-06-08 DIAGNOSIS — Z7901 Long term (current) use of anticoagulants: Secondary | ICD-10-CM | POA: Insufficient documentation

## 2021-06-08 DIAGNOSIS — R079 Chest pain, unspecified: Secondary | ICD-10-CM | POA: Diagnosis not present

## 2021-06-08 DIAGNOSIS — R002 Palpitations: Secondary | ICD-10-CM | POA: Diagnosis not present

## 2021-06-08 DIAGNOSIS — R531 Weakness: Secondary | ICD-10-CM | POA: Diagnosis not present

## 2021-06-08 DIAGNOSIS — K449 Diaphragmatic hernia without obstruction or gangrene: Secondary | ICD-10-CM | POA: Diagnosis not present

## 2021-06-08 LAB — BASIC METABOLIC PANEL
Anion gap: 10 (ref 5–15)
BUN: 26 mg/dL — ABNORMAL HIGH (ref 8–23)
CO2: 24 mmol/L (ref 22–32)
Calcium: 9.9 mg/dL (ref 8.9–10.3)
Chloride: 103 mmol/L (ref 98–111)
Creatinine, Ser: 1 mg/dL (ref 0.44–1.00)
GFR, Estimated: 55 mL/min — ABNORMAL LOW (ref >60)
Glucose, Bld: 117 mg/dL — ABNORMAL HIGH (ref 70–99)
Potassium: 4.9 mmol/L (ref 3.5–5.1)
Sodium: 137 mmol/L (ref 135–145)

## 2021-06-08 LAB — CBC
HCT: 45.8 % (ref 36.0–46.0)
Hemoglobin: 15.2 g/dL — ABNORMAL HIGH (ref 12.0–15.0)
MCH: 30.5 pg (ref 26.0–34.0)
MCHC: 33.2 g/dL (ref 30.0–36.0)
MCV: 91.8 fL (ref 80.0–100.0)
Platelets: 295 10*3/uL (ref 150–400)
RBC: 4.99 MIL/uL (ref 3.87–5.11)
RDW: 12.9 % (ref 11.5–15.5)
WBC: 10.5 10*3/uL (ref 4.0–10.5)
nRBC: 0 % (ref 0.0–0.2)

## 2021-06-08 LAB — TROPONIN I (HIGH SENSITIVITY): Troponin I (High Sensitivity): 5 ng/L (ref <18)

## 2021-06-08 NOTE — ED Notes (Signed)
Called lab to add on troponin  

## 2021-06-08 NOTE — ED Triage Notes (Signed)
Pt states last night started to feel heart fluttering and felt like heart was skipping a beat. Pt denies any chest pain. Starts some SOB and weakness and doesn't feel like herself.

## 2021-06-08 NOTE — ED Notes (Signed)
Pt via pov from home with afib; states she has hx of the same but that she feels weak and "not myself" today. Pt reports prior visits for the same, states her medications have been changed in consultation with cardiology when she has been here before. Pt alert & oriented, 2 sons at bedside; nad noted.

## 2021-06-08 NOTE — Discharge Instructions (Addendum)
If you begin to feel palpitations or skipped heartbeat or weakness again, please check both your blood pressure and your heart rate.  If your blood pressure is low (top number below 90 mmhg), or your heart rate is abnormal (below 50 beats per minute, or above 110 beats per minute), you should call 911 or return to the emergency department.    If you ever begin to feel lightheaded, please make sure you slowly sit yourself down and then lie yourself flat if possible.  This to prevent you from losing consciousness.  Please call your cardiologist office tomorrow to schedule a follow-up appointment.

## 2021-06-08 NOTE — ED Provider Notes (Signed)
[facility] EMERGENCY DEPT Provider Note   CSN: [id number] Arrival date & time: 06/08/21  1121     History  Chief Complaint  Patient presents with   Irregular Heart Beat    Deborah Mcmillan is a 87 y.o. female with a history of paroxysmal A-fib, on metoprolol, diltiazem, Xarelto, presenting to ED with palpitations and shortness of breath.  Patient reports she has been having these sensations for the past 2 days, feels her heart is "skipping beats" and also felt short of breath and weak associated with it.  She wore an Apple Watch her smart watch, which she showed me the device while she was symptomatic, showing that she was in A-fib HR 70's.  She denies chest pain or pressure.  She is here with her sons at bedside.  Cardiologist is Dr Einar Gip.  She denies history of MI or cardiac stents.  HPI     Home Medications Prior to Admission medications   Medication Sig Start Date End Date Taking? Authorizing Provider  albuterol (VENTOLIN HFA) 108 (90 Base) MCG/ACT inhaler Inhale 1 puff into the lungs every 4 (four) hours as needed for wheezing or shortness of breath. 09/25/20   Hunsucker, Bonna Gains, MD  budesonide-formoterol (SYMBICORT) 160-4.5 MCG/ACT inhaler Inhale 2 puffs into the lungs 2 (two) times daily.    [provider]  Cholecalciferol (VITAMIN D) 50 MCG (2000 UT) CAPS Take 2,000 Units by mouth daily.    [provider]  denosumab (PROLIA) 60 MG/ML SOSY injection Inject 60 mg into the skin every 6 (six) months.    [provider]  diltiazem (CARDIZEM CD) 120 MG 24 hr capsule Take 1 capsule (120 mg total) by mouth daily with supper. 04/19/21   Hongalgi, Lenis Dickinson, MD  fluticasone (FLONASE) 50 MCG/ACT nasal spray Place 1 spray into both nostrils daily as needed for allergies or rhinitis. 04/19/21   Hongalgi, Lenis Dickinson, MD  loperamide (IMODIUM) 2 MG capsule Take 1 capsule (2 mg total) by mouth every 8 (eight) hours as needed for diarrhea or loose stools (For  3 to 5 days.  If diarrhea recurs or worsens, follow-up with your family physician.). 04/19/21   Hongalgi, Lenis Dickinson, MD  MAGNESIUM CITRATE PO Take 1 tablet by mouth in the morning.    [provider]  metoprolol succinate (TOPROL-XL) 100 MG 24 hr tablet TAKE 1 TABLET(100 MG) BY MOUTH EVERY EVENING 03/12/21   Adrian Prows, MD  Multiple Vitamin (MULTIVITAMIN WITH MINERALS) TABS tablet Take 1 tablet by mouth daily.    [provider]  omeprazole (PRILOSEC) 20 MG capsule Take 20 mg by mouth in the morning.    [provider]  PRALUENT 75 MG/ML SOAJ INJECT 75 MG UNDER THE SKIN EVERY 14 DAYS 07/11/20   Adrian Prows, MD  rivaroxaban (XARELTO) 20 MG TABS tablet Take 1 tablet (20 mg total) by mouth daily with supper. 02/16/21   Adrian Prows, MD  spironolactone (ALDACTONE) 50 MG tablet TAKE 1 TABLET BY MOUTH EVERY MORNING 09/12/20   Adrian Prows, MD      Allergies    Calcium-containing compounds, Magnesium-containing compounds, Tape, Nitrofurantoin, Cefuroxime axetil, Codeine, Crestor [rosuvastatin], Lodine [etodolac], Niacin and related, Penicillins, Reclast [zoledronic acid], Sulfa antibiotics, Tetracyclines & related, Welchol [colesevelam], and Zetia [ezetimibe]    Review of Systems   Review of Systems  Physical Exam Updated Vital Signs BP 114/60 (BP Location: Right Arm)   Pulse 75   Temp 98.1 F (36.7 C) (Oral)   Resp 19  Ht 5' (1.524 m)   Wt 77.1 kg   SpO2 95%   BMI 33.20 kg/m  Physical Exam Constitutional:      General: She is not in acute distress. HENT:     Head: Normocephalic and atraumatic.  Eyes:     Conjunctiva/sclera: Conjunctivae normal.     Pupils: Pupils are equal, round, and reactive to light.  Cardiovascular:     Rate and Rhythm: Normal rate. Rhythm irregular.  Pulmonary:     Effort: Pulmonary effort is normal. No respiratory distress.  Abdominal:     General: There is no distension.     Tenderness: There is no abdominal tenderness.  Skin:    General: Skin  is warm and dry.  Neurological:     General: No focal deficit present.     Mental Status: She is alert and oriented to person, place, and time. Mental status is at baseline.  Psychiatric:        Mood and Affect: Mood normal.        Behavior: Behavior normal.    ED Results / Procedures / Treatments   Labs (all labs ordered are listed, but only abnormal results are displayed) Labs Reviewed  BASIC METABOLIC PANEL - Abnormal; Notable for the following components:      Result Value   Glucose, Bld 117 (*)    BUN 26 (*)    GFR, Estimated 55 (*)    All other components within normal limits  CBC - Abnormal; Notable for the following components:   Hemoglobin 15.2 (*)    All other components within normal limits  TROPONIN I (HIGH SENSITIVITY)    EKG EKG Interpretation  Date/Time:  Monday Jun 08 2021 11:30:05 EDT Ventricular Rate:  80 PR Interval:    QRS Duration: 84 QT Interval:  348 QTC Calculation: 401 R Axis:   37 Text Interpretation: Atrial fibrillation Abnormal ECG When compared with ECG of 13-Apr-2021 17:55, PREVIOUS ECG IS PRESENT Confirmed by Octaviano Glow [phone]) on 06/08/2021 1:38:16 PM  Radiology DG Chest 2 View  Result Date: 06/08/2021 CLINICAL DATA:  Pt states last night started to feel heart fluttering and felt like heart was skipping a beat. Pt denies any chest pain. Starts some SOB and weakness and doesn't feel like herself. HX dysrhythmia, PNA, asthma, and  PO  atrial fib. EXAM: CHEST - 2 VIEW COMPARISON:  04/13/2021. FINDINGS: Cardiac silhouette is normal in size. Small hiatal hernia. No mediastinal or hilar masses. No evidence of adenopathy. Clear lungs.  No pleural effusion or pneumothorax. Skeletal structures are intact. IMPRESSION: No active cardiopulmonary disease. Electronically Signed   By: Lajean Manes M.D.   On: 06/08/2021 11:43    Procedures Procedures    Medications Ordered in ED Medications - No data to display  ED Course/ Medical Decision Making/  A&P                           Medical Decision Making Amount and/or Complexity of Data Reviewed Labs: ordered. Radiology: ordered.   This patient presents to the ED with concern for palpitations, shortness of breath. This involves an extensive number of treatment options, and is a complaint that carries with it a high risk of complications and morbidity.  The differential diagnosis includes A-fib with RVR versus anemia versus other arrhythmia versus atypical ACS versus other  She is asymptomatic here in the emergency department.  Co-morbidities that complicate the patient evaluation: History of A-fib and high  risk for A-fib exacerbation RVR  Additional history obtained from patient's children at bedside  External records from outside source obtained and reviewed including echo march 2023 (Dr Einar Gip) showing EF 60-65%, no wall motion abnormalities, mild aortic and mitral regurg, mild tricuspid regurg.  I ordered and personally interpreted labs.  The pertinent results include: Electrolytes within normal limits, no leukocytosis, hemoglobin normal, no AKI, troponin 5  I ordered imaging studies including x-ray chest I independently visualized and interpreted imaging which showed no focal infiltrate or active disease I agree with the radiologist interpretation  The patient was maintained on a cardiac monitor.  I personally viewed and interpreted the cardiac monitored which showed an underlying rhythm of: Rate controlled atrial fibrillation  Per my interpretation the patient's ECG shows A-fib that is rate controlled, no acute ischemic finding  Test Considered:  -Lower suspicion for acute PE given that she is on Xarelto, has no hypoxia, her symptoms are intermittent and currently gone.  Do not believe we need a CT PE.   After the interventions noted above, I reevaluated the patient and found that they have: stayed the same Asymptomatic in the ER   Dispostion:  After consideration of the  diagnostic results and the patients response to treatment, I feel that the patent would benefit from outpatient follow-up with cardiologist.         Final Clinical Impression(s) / ED Diagnoses Final diagnoses:  Palpitations    Rx / DC Orders ED Discharge Orders     None         Brooklen Runquist, Carola Rhine, MD 06/08/21 [phone]

## 2021-06-18 ENCOUNTER — Ambulatory Visit: Payer: Medicare Other | Admitting: Cardiology

## 2021-06-18 VITALS — BP 140/64 | HR 64 | Temp 98.5°F | Resp 16 | Ht 60.0 in | Wt 168.0 lb

## 2021-06-18 DIAGNOSIS — E7800 Pure hypercholesterolemia, unspecified: Secondary | ICD-10-CM | POA: Diagnosis not present

## 2021-06-18 DIAGNOSIS — I10 Essential (primary) hypertension: Secondary | ICD-10-CM

## 2021-06-18 DIAGNOSIS — J029 Acute pharyngitis, unspecified: Secondary | ICD-10-CM

## 2021-06-18 DIAGNOSIS — I480 Paroxysmal atrial fibrillation: Secondary | ICD-10-CM

## 2021-06-18 MED ORDER — AZITHROMYCIN 250 MG PO TABS: ORAL_TABLET | ORAL | 0 refills | Status: DC

## 2021-06-18 NOTE — Progress Notes (Unsigned)
Primary Physician/Referring:  Deland Pretty, MD  Patient ID: Deborah Mcmillan, female    DOB: 19-Oct-1934, 87 y.o.   MRN: [id number]  Chief Complaint  Patient presents with   Hospitalization Follow-up   Atrial Fibrillation   HPI:    Deborah Mcmillan  is a 87 y.o.  Caucasian female with carotid atheresclerosis and hypertension and hyperlipidemia, LDL in the range of 170-190, however she has not been able to tolerate any statins.  She is now on Praluent. She has bronchial asthma and multiple medication allergies and intolerances.  H/O A.. Fib post op  05/06/2009 after minor surgery to skin.   She had been doing well until 08/15/2020 presented to the emergency room with shortness of breath "difficulty in getting air in", fatigue and palpitations when she woke up from her sleep, found to be in A. fib with RVR.  However patient spontaneously converted to sinus rhythm.  She has been on Eliquis since then, patient prefers to continue anticoagulation.  She made an appointment to see me today as on 12/14/2020 she felt dizzy, not well and palpitations and she is wearing a smart watch that suggested probable A. fib. She is tolerating the medications well.  She is also scheduled for cholecystectomy on 01/01/2021.  Past Medical History:  Diagnosis Date   Arthritis    Asthma    Complication of anesthesia    difficult to wake up and agitation   Dyslipidemia    Dysrhythmia    h/o atrial couplets with short PAT, h/o a-fib during surgery (event monitor)   GERD (gastroesophageal reflux disease)    History of nuclear stress test 05/11/2009   dipyridamole; normal, low risk    Hypertension    Osteoporosis    Pneumonia    PONV (postoperative nausea and vomiting)    Postoperative atrial fibrillation ([facility]) 01/11/2009   Seasonal allergies    Past Surgical History:  Procedure Laterality Date   APPENDECTOMY  1961   BLADDER SURGERY  2002   BREAST BIOPSY Left 05/09/2012   calcified and hyalinized  fibroadenomatous and sclerosing adenosis   CHOLECYSTECTOMY N/A 01/01/2021   Procedure: LAPAROSCOPIC CHOLECYSTECTOMY;  Surgeon: Clovis Riley, MD;  Location: [facility] ORS;  Service: General;  Laterality: N/A;   COSMETIC SURGERY  2011   1 episode of atrial fibrillation during this    [place]   hiatal   lump removal  05/06/2009   [facility]   NM MYOVIEW LTD  May 2011   No ischemia or infarction.   SHOULDER SURGERY Left Feb 2015   Dr. Veverly Fells - arthroscopy (rotator cuff ?debridement plus biceps tenodesis)   TRANSTHORACIC ECHOCARDIOGRAM  05/2009   EF=>55%, with normal LV systolic function, impaired LV relaxation; trace MR/TR; mild AV regurg   Family History  Problem Relation Age of Onset   Cancer Father    Cancer - Other Father    Cancer Brother    Cancer - Other Brother    Stroke Mother    Cancer - Other Sister    Cancer - Other Sister    Breast cancer Neg Hx     Social History   Tobacco Use   Smoking status: Never   Smokeless tobacco: Never  Substance Use Topics   Alcohol use: No   ROS  Review of Systems  Cardiovascular:  Positive for palpitations (occasional). Negative for chest pain, dyspnea on exertion and leg swelling.  Gastrointestinal:  Positive for abdominal pain (scheduled for cholecystectomy). Negative for melena.  Objective  Blood pressure 140/64, pulse 64, temperature 98.5 F (36.9 C), temperature source Temporal, resp. rate 16, height 5' (1.524 m), weight 168 lb (76.2 kg), SpO2 95 %.     06/18/2021   12:21 PM 06/08/2021    3:22 PM 06/08/2021    3:15 PM  Vitals with BMI  Height '5\' 0"'$     Weight 168 lbs    BMI 84.13    Systolic 244 010   Diastolic 64 60   Pulse 64 75 76    Orthostatic VS for the past 72 hrs (Last 3 readings):  Orthostatic BP Patient Position BP Location Cuff Size Orthostatic Pulse  06/18/21 1242 121/54 Standing Right Arm Normal 79  06/18/21 1240 135/53 Sitting Right Arm Normal 66  06/18/21 1238 141/52 Supine Right Arm Normal 62      Physical Exam Neck:     Vascular: No carotid bruit or JVD.  Cardiovascular:     Rate and Rhythm: Normal rate and regular rhythm.     Pulses: Intact distal pulses.     Heart sounds: Normal heart sounds. No murmur heard.    No gallop.  Pulmonary:     Effort: Pulmonary effort is normal.     Breath sounds: Normal breath sounds.  Abdominal:     General: Bowel sounds are normal.     Palpations: Abdomen is soft.  Musculoskeletal:        General: No swelling.    Laboratory examination:   Recent Labs    04/17/21 0356 04/19/21 0358 06/08/21 1144  NA 137 137 137  K 4.7 3.8 4.9  CL 109 104 103  CO2 '22 24 24  '$ GLUCOSE 88 98 117*  BUN 10 12 26*  CREATININE 0.66 0.74 1.00  CALCIUM 8.4* 9.1 9.9  GFRNONAA >60 >60 55*   estimated creatinine clearance is 37.5 mL/min (by C-G formula based on SCr of 1 mg/dL).     Latest Ref Rng & Units 06/08/2021   11:44 AM 04/19/2021    3:58 AM 04/17/2021    3:56 AM  CMP  Glucose 70 - 99 mg/dL 117  98  88   BUN 8 - 23 mg/dL '26  12  10   '$ Creatinine 0.44 - 1.00 mg/dL 1.00  0.74  0.66   Sodium 135 - 145 mmol/L 137  137  137   Potassium 3.5 - 5.1 mmol/L 4.9  3.8  4.7   Chloride 98 - 111 mmol/L 103  104  109   CO2 22 - 32 mmol/L '24  24  22   '$ Calcium 8.9 - 10.3 mg/dL 9.9  9.1  8.4       Latest Ref Rng & Units 06/08/2021   11:44 AM 04/16/2021    3:41 AM 04/14/2021    2:05 AM  CBC  WBC 4.0 - 10.5 K/uL 10.5  6.6  8.6   Hemoglobin 12.0 - 15.0 g/dL 15.2  12.6  14.0   Hematocrit 36.0 - 46.0 % 45.8  37.1  40.8   Platelets 150 - 400 K/uL 295  173  176     External labs:    Labs 04/30/2021:  Total cholesterol 163, triglycerides 86, HDL 71, LDL 75.  TSH normal at 1.54.  Vitamin D 26.7.  Medications and allergies   Allergies  Allergen Reactions   Calcium-Containing Compounds Other (See Comments)    Muscle cramping   Magnesium-Containing Compounds Itching and Rash   Tape Itching and Rash    Plastic, and "brown tape"  Nitrofurantoin Other (See  Comments)    Unknown   Cefuroxime Axetil Other (See Comments)    leg pain   Codeine Nausea Only    Pain & upset stomach   Crestor [Rosuvastatin] Other (See Comments)    Leg cramping   Lodine [Etodolac] Other (See Comments)    Upset stomach   Niacin And Related Rash    "on fire"   Penicillins Itching, Swelling and Rash    She reports some swelling and itchy.    Reclast [Zoledronic Acid] Other (See Comments)    Caused severe jaw pain   Sulfa Antibiotics Other (See Comments)    Pain & upset stomach   Tetracyclines & Related Rash    Upset stomach   Welchol [Colesevelam] Nausea Only and Other (See Comments)    Stomach did not feel good   Zetia [Ezetimibe] Other (See Comments)    Muscle pain     Current Outpatient Medications:    albuterol (VENTOLIN HFA) 108 (90 Base) MCG/ACT inhaler, Inhale 1 puff into the lungs every 4 (four) hours as needed for wheezing or shortness of breath., Disp: 1 each, Rfl: 11   azithromycin (ZITHROMAX Z-PAK) 250 MG tablet, Take 2 tablet once then one tablet daily, Disp: 6 each, Rfl: 0   Cholecalciferol (VITAMIN D) 50 MCG (2000 UT) CAPS, Take 2,000 Units by mouth daily., Disp: , Rfl:    denosumab (PROLIA) 60 MG/ML SOSY injection, Inject 60 mg into the skin every 6 (six) months., Disp: , Rfl:    diltiazem (CARDIZEM CD) 120 MG 24 hr capsule, Take 1 capsule (120 mg total) by mouth daily with supper., Disp: , Rfl:    MAGNESIUM CITRATE PO, Take 1 tablet by mouth in the morning., Disp: , Rfl:    metoprolol succinate (TOPROL-XL) 100 MG 24 hr tablet, TAKE 1 TABLET(100 MG) BY MOUTH EVERY EVENING, Disp: 90 tablet, Rfl: 0   Multiple Vitamin (MULTIVITAMIN WITH MINERALS) TABS tablet, Take 1 tablet by mouth daily., Disp: , Rfl:    omeprazole (PRILOSEC) 20 MG capsule, Take 20 mg by mouth in the morning., Disp: , Rfl:    PRALUENT 75 MG/ML SOAJ, INJECT 75 MG UNDER THE SKIN EVERY 14 DAYS, Disp: 6 mL, Rfl: 3   rivaroxaban (XARELTO) 20 MG TABS tablet, Take 1 tablet (20 mg total)  by mouth daily with supper., Disp: 90 tablet, Rfl: 3   spironolactone (ALDACTONE) 50 MG tablet, TAKE 1 TABLET BY MOUTH EVERY MORNING, Disp: 90 tablet, Rfl: 3    Radiology:   Chest x-ray 06/08/2021: Cardiac silhouette is normal in size. Small hiatal hernia. No mediastinal or hilar masses. No evidence of adenopathy.   Clear lungs.  No pleural effusion or pneumothorax.  Cardiac Studies:   Carotid artery duplex 10/12/2015: No hemodynamically significant arterial disease in the internal carotid artery bilaterally. Minimal mixed plaque noted. Antegrade vertebral artery flow.  Nuclear stress test   [12/11/2015]:  1. The resting electrocardiogram demonstrated normal sinus rhythm, normal resting conduction, no resting arrhythmias and normal rest repolarization. Stress EKG is non-diagnostic for ischemia as it a pharmacologic stress using Lexiscan. Stress symptoms included dyspnea. 2. Myocardial perfusion imaging is normal. Overall left ventricular systolic function was normal without regional wall motion abnormalities. The left ventricular ejection fraction was 58%.  PCV ECHOCARDIOGRAM COMPLETE 44/01/270 Normal LV systolic function with visual EF 60-65%. Left ventricle cavity is normal in size. Normal global wall motion. Indeterminate diastolic filling pattern, normal LAP. Left atrial cavity is mildly dilated. Mild (Grade I) aortic regurgitation.  Mild (Grade I) mitral regurgitation. Mild tricuspid regurgitation. No evidence of pulmonary hypertension. RVSP measures 30 mmHg. Mild pulmonic regurgitation. Prior study dated 12/09/2015: LVEF 17%, normal diastolic function, mild LAE, mild to moderate AR, Mild to moderate MR, Mild TR, no PHTN.     EKG EKG 06/18/2021: Sinus rhythm scratch that normal sinus rhythm at rate of 62 bpm, normal axis.  No evidence of ischemia, normal EKG. No significant change from 04/25/2020.   EKG 06/08/2021: Atrial fibrillation with controlled ventricular response, no  evidence of ischemia.  Abnormal EKG.  Assessment     ICD-10-CM   1. Paroxysmal atrial fibrillation (HCC)  I48.0 EKG 12-Lead    2. Primary hypertension  I10     3. Hypercholesteremia  E78.00     4. Acute pharyngitis, unspecified etiology  J02.9 azithromycin (ZITHROMAX Z-PAK) 250 MG tablet      CHA2DS2-VASc Score is 4.  Yearly risk of stroke: 4.8% (A, HTN, Female).  Score of 1=0.6; 2=2.2; 3=3.2; 4=4.8; 5=7.2; 6=9.8; 7=>9.8) -(CHF; HTN; vasc disease DM,  Female = 1; Age <65 =0; 65-74 = 1,  >75 =2; stroke/embolism= 2).    Meds ordered this encounter  Medications   azithromycin (ZITHROMAX Z-PAK) 250 MG tablet    Sig: Take 2 tablet once then one tablet daily    Dispense:  6 each    Refill:  0    Medications Discontinued During This Encounter  Medication Reason   budesonide-formoterol (SYMBICORT) 160-4.5 MCG/ACT inhaler    fluticasone (FLONASE) 50 MCG/ACT nasal spray    loperamide (IMODIUM) 2 MG capsule    Orders Placed This Encounter  Procedures   EKG 12-Lead      Recommendations:   Deborah Mcmillan  is a 87 y.o. Caucasian female with carotid atheresclerosis and hypertension and hyperlipidemia, LDL in the range of 170-190, however she has not been able to tolerate any statins.  She is now on Praluent. She has bronchial asthma and multiple medication allergies and intolerances.  H/O A.. Fib post op  05/06/2009 after minor surgery to skin.   She had been doing well until 08/15/2020 presented to the emergency room with shortness of breath "difficulty in getting air in", fatigue and palpitations when she woke up from her sleep, found to be in A. fib with RVR.  However patient spontaneously converted to sinus rhythm.  Again she presented with similar symptoms on 06/08/2021 to the emergency room.  She again spontaneously converted to sinus rhythm.  She has been on Xarelto since then, patient prefers to continue anticoagulation.  I reviewed her hospitalization, chest x-ray was clear without  evidence of heart failure, her lungs are clear today and she is in sinus rhythm.  I discussed with her that paroxysmal episodes will occur.  As her atrial fibrillation burden is low, for now there is no need for consideration of antiarrhythmic therapy.  She is tolerating anticoagulation without any bleeding diathesis.  I will screen her into OCEANIC-AF (Asundexian - factor XIa inhibitor PO BID vs Apixaban PO BID in patients with A. Fib for stroke prevention.  She has developed upper respiratory infection, she usually takes Z-Pak, I prescribed the medication.  Otherwise stable from cardiac standpoint, I will see her back in 6 months.  Blood pressure was slightly elevated, she is also orthostatic, this is probably due to acute respiratory infection.  Good hydration discussed with the patient.    Adrian Prows, MD, [facility] 06/18/2021, 1:21 PM Office: [phone] Pager: [phone]

## 2021-06-20 ENCOUNTER — Encounter: Payer: Self-pay | Admitting: Cardiology

## 2021-06-22 ENCOUNTER — Other Ambulatory Visit: Payer: Self-pay | Admitting: Cardiology

## 2021-06-23 DIAGNOSIS — H02831 Dermatochalasis of right upper eyelid: Secondary | ICD-10-CM | POA: Diagnosis not present

## 2021-06-23 DIAGNOSIS — H25013 Cortical age-related cataract, bilateral: Secondary | ICD-10-CM | POA: Diagnosis not present

## 2021-06-23 DIAGNOSIS — H25043 Posterior subcapsular polar age-related cataract, bilateral: Secondary | ICD-10-CM | POA: Diagnosis not present

## 2021-06-23 DIAGNOSIS — H2512 Age-related nuclear cataract, left eye: Secondary | ICD-10-CM | POA: Diagnosis not present

## 2021-06-23 DIAGNOSIS — H2513 Age-related nuclear cataract, bilateral: Secondary | ICD-10-CM | POA: Diagnosis not present

## 2021-06-26 ENCOUNTER — Ambulatory Visit: Payer: BLUE CROSS/BLUE SHIELD | Admitting: Cardiology

## 2021-06-29 ENCOUNTER — Other Ambulatory Visit: Payer: Self-pay | Admitting: Nurse Practitioner

## 2021-06-29 ENCOUNTER — Other Ambulatory Visit: Payer: Self-pay | Admitting: Internal Medicine

## 2021-06-29 ENCOUNTER — Other Ambulatory Visit: Payer: Self-pay | Admitting: Obstetrics and Gynecology

## 2021-06-29 DIAGNOSIS — Z1231 Encounter for screening mammogram for malignant neoplasm of breast: Secondary | ICD-10-CM

## 2021-07-08 DIAGNOSIS — M25551 Pain in right hip: Secondary | ICD-10-CM | POA: Diagnosis not present

## 2021-07-22 ENCOUNTER — Ambulatory Visit
Admission: RE | Admit: 2021-07-22 | Discharge: 2021-07-22 | Disposition: A | Discharge status: Home / Self Care | Payer: Medicare Other | Source: Ambulatory Visit | Attending: Internal Medicine | Admitting: Internal Medicine

## 2021-07-22 DIAGNOSIS — Z1231 Encounter for screening mammogram for malignant neoplasm of breast: Secondary | ICD-10-CM | POA: Diagnosis not present

## 2021-07-23 ENCOUNTER — Ambulatory Visit: Payer: Medicare Other | Admitting: Pulmonary Disease

## 2021-08-01 ENCOUNTER — Other Ambulatory Visit: Payer: Self-pay | Admitting: Cardiology

## 2021-08-05 DIAGNOSIS — J302 Other seasonal allergic rhinitis: Secondary | ICD-10-CM | POA: Diagnosis not present

## 2021-08-05 DIAGNOSIS — R0982 Postnasal drip: Secondary | ICD-10-CM | POA: Diagnosis not present

## 2021-08-25 DIAGNOSIS — M810 Age-related osteoporosis without current pathological fracture: Secondary | ICD-10-CM | POA: Diagnosis not present

## 2021-08-27 ENCOUNTER — Encounter: Payer: Self-pay | Admitting: Cardiology

## 2021-09-20 ENCOUNTER — Other Ambulatory Visit: Payer: Self-pay | Admitting: Cardiology

## 2021-09-20 DIAGNOSIS — I10 Essential (primary) hypertension: Secondary | ICD-10-CM

## 2021-09-20 DIAGNOSIS — I480 Paroxysmal atrial fibrillation: Secondary | ICD-10-CM

## 2021-10-02 ENCOUNTER — Encounter (HOSPITAL_BASED_OUTPATIENT_CLINIC_OR_DEPARTMENT_OTHER): Payer: Self-pay

## 2021-10-02 ENCOUNTER — Emergency Department (HOSPITAL_BASED_OUTPATIENT_CLINIC_OR_DEPARTMENT_OTHER): Payer: Medicare Other | Admitting: Radiology

## 2021-10-02 ENCOUNTER — Emergency Department (HOSPITAL_BASED_OUTPATIENT_CLINIC_OR_DEPARTMENT_OTHER)
Admission: EM | Admit: 2021-10-02 | Discharge: 2021-10-02 | Disposition: A | Discharge status: Home / Self Care | Payer: Medicare Other | Attending: Emergency Medicine | Admitting: Emergency Medicine

## 2021-10-02 DIAGNOSIS — R531 Weakness: Secondary | ICD-10-CM | POA: Diagnosis not present

## 2021-10-02 DIAGNOSIS — I4891 Unspecified atrial fibrillation: Secondary | ICD-10-CM | POA: Diagnosis not present

## 2021-10-02 DIAGNOSIS — R0682 Tachypnea, not elsewhere classified: Secondary | ICD-10-CM | POA: Diagnosis not present

## 2021-10-02 DIAGNOSIS — R519 Headache, unspecified: Secondary | ICD-10-CM | POA: Diagnosis not present

## 2021-10-02 DIAGNOSIS — R0602 Shortness of breath: Secondary | ICD-10-CM | POA: Diagnosis not present

## 2021-10-02 DIAGNOSIS — U071 COVID-19: Secondary | ICD-10-CM | POA: Insufficient documentation

## 2021-10-02 LAB — BASIC METABOLIC PANEL
Anion gap: 15 (ref 5–15)
BUN: 28 mg/dL — ABNORMAL HIGH (ref 8–23)
CO2: 20 mmol/L — ABNORMAL LOW (ref 22–32)
Calcium: 10 mg/dL (ref 8.9–10.3)
Chloride: 93 mmol/L — ABNORMAL LOW (ref 98–111)
Creatinine, Ser: 1.05 mg/dL — ABNORMAL HIGH (ref 0.44–1.00)
GFR, Estimated: 52 mL/min — ABNORMAL LOW (ref >60)
Glucose, Bld: 89 mg/dL (ref 70–99)
Potassium: 4.4 mmol/L (ref 3.5–5.1)
Sodium: 128 mmol/L — ABNORMAL LOW (ref 135–145)

## 2021-10-02 LAB — CBC WITH DIFFERENTIAL/PLATELET
Abs Immature Granulocytes: 0.07 10*3/uL (ref 0.00–0.07)
Basophils Absolute: 0.1 10*3/uL (ref 0.0–0.1)
Basophils Relative: 1 %
Eosinophils Absolute: 0 10*3/uL (ref 0.0–0.5)
Eosinophils Relative: 0 %
HCT: 44.3 % (ref 36.0–46.0)
Hemoglobin: 15.8 g/dL — ABNORMAL HIGH (ref 12.0–15.0)
Immature Granulocytes: 1 %
Lymphocytes Relative: 16 %
Lymphs Abs: 1.4 10*3/uL (ref 0.7–4.0)
MCH: 32.2 pg (ref 26.0–34.0)
MCHC: 35.7 g/dL (ref 30.0–36.0)
MCV: 90.4 fL (ref 80.0–100.0)
Monocytes Absolute: 1.2 10*3/uL — ABNORMAL HIGH (ref 0.1–1.0)
Monocytes Relative: 13 %
Neutro Abs: 6 10*3/uL (ref 1.7–7.7)
Neutrophils Relative %: 69 %
Platelets: 177 10*3/uL (ref 150–400)
RBC: 4.9 MIL/uL (ref 3.87–5.11)
RDW: 14.4 % (ref 11.5–15.5)
WBC: 8.7 10*3/uL (ref 4.0–10.5)
nRBC: 0 % (ref 0.0–0.2)

## 2021-10-02 LAB — SARS CORONAVIRUS 2 BY RT PCR: SARS Coronavirus 2 by RT PCR: POSITIVE — AB

## 2021-10-02 MED ORDER — ONDANSETRON HCL 4 MG/2ML IJ SOLN
4.0000 mg | Freq: Once | INTRAMUSCULAR | Status: AC
Start: 2021-10-02 — End: 2021-10-02
  Administered 2021-10-02: 4 mg via INTRAVENOUS
  Filled 2021-10-02: qty 2

## 2021-10-02 MED ORDER — ONDANSETRON HCL 4 MG PO TABS: 4.0000 mg | ORAL_TABLET | Freq: Four times a day (QID) | ORAL | 0 refills | Status: DC

## 2021-10-02 MED ORDER — AEROCHAMBER PLUS FLO-VU MISC
1.0000 | Freq: Once | Status: AC
Start: 2021-10-02 — End: 2021-10-02
  Administered 2021-10-02: 1
  Filled 2021-10-02: qty 1

## 2021-10-02 MED ORDER — MOLNUPIRAVIR EUA 200MG CAPSULE
4.0000 | ORAL_CAPSULE | Freq: Two times a day (BID) | ORAL | Status: DC
  Filled 2021-10-02: qty 4

## 2021-10-02 MED ORDER — LACTATED RINGERS IV BOLUS
1000.0000 mL | Freq: Once | INTRAVENOUS | Status: AC
Start: 2021-10-02 — End: 2021-10-02
  Administered 2021-10-02: 1000 mL via INTRAVENOUS

## 2021-10-02 MED ORDER — ACETAMINOPHEN 500 MG PO TABS
1000.0000 mg | ORAL_TABLET | Freq: Once | ORAL | Status: AC
  Administered 2021-10-02: 1000 mg via ORAL
  Filled 2021-10-02: qty 2

## 2021-10-02 MED ORDER — MOLNUPIRAVIR EUA 200MG CAPSULE: 4.0000 | ORAL_CAPSULE | Freq: Two times a day (BID) | ORAL | 0 refills | Status: AC

## 2021-10-02 NOTE — Discharge Instructions (Addendum)
Your labs are overall reassuring, your vital signs are stable.  We will prescribe you molnupiravir to take twice daily for the next 5 days.  This is a new antiviral that you may take with your blood thinner.  Do not stop your blood thinner.

## 2021-10-02 NOTE — ED Triage Notes (Signed)
Pt presents to the ED with daughter-in-law. States that she started feeling sick on Wednesday and was see at [facility] today where she was diagnosed with covid. Was sent here in relation to [facility], tachypnea, tachycardia, emesis, and bowel incontinence. HX of afib. Pt A&Ox4 at time of triage. VSS.

## 2021-10-02 NOTE — ED Provider Notes (Signed)
[place] EMERGENCY DEPT Provider Note   CSN: [id number] Arrival date & time: 10/02/21  1939     History Chief Complaint  Patient presents with   Shortness of Breath   Covid Positive    HPI [place] L Burkey is a 87 y.o. female presenting for fever cough congestion as well as been COVID-positive in the setting of A-fib use.  Was seen at urgent care today and they recommended she come here for further diagnostic evaluation and management.  She denies fevers or chills nausea vomiting, syncope or shortness of breath..   Patient's recorded medical, surgical, social, medication list and allergies were reviewed in the Snapshot window as part of the initial history.   Review of Systems   Review of Systems  Constitutional:  Positive for fatigue and fever. Negative for chills.  HENT:  Negative for ear pain and sore throat.   Eyes:  Negative for pain and visual disturbance.  Respiratory:  Positive for cough. Negative for shortness of breath.   Cardiovascular:  Negative for chest pain and palpitations.  Gastrointestinal:  Negative for abdominal pain and vomiting.  Genitourinary:  Negative for dysuria and hematuria.  Musculoskeletal:  Negative for arthralgias and back pain.  Skin:  Negative for color change and rash.  Neurological:  Negative for seizures and syncope.  All other systems reviewed and are negative.   Physical Exam Updated Vital Signs BP (!) 145/60   Pulse (!) 110   Temp 98.1 F (36.7 C) (Oral)   Resp 18   Ht 5' (1.524 m)   Wt 74.4 kg   SpO2 98%   BMI 32.03 kg/m  Physical Exam Vitals and nursing note reviewed.  Constitutional:      General: She is not in acute distress.    Appearance: She is well-developed.  HENT:     Head: Normocephalic and atraumatic.  Eyes:     Conjunctiva/sclera: Conjunctivae normal.  Cardiovascular:     Rate and Rhythm: Normal rate and regular rhythm.     Heart sounds: No murmur heard. Pulmonary:     Effort: Pulmonary  effort is normal. No respiratory distress.     Breath sounds: Normal breath sounds.  Abdominal:     General: There is no distension.     Palpations: Abdomen is soft.     Tenderness: There is no abdominal tenderness. There is no right CVA tenderness or left CVA tenderness.  Musculoskeletal:        General: No swelling or tenderness. Normal range of motion.     Cervical back: Neck supple.  Skin:    General: Skin is warm and dry.     Capillary Refill: Capillary refill takes less than 2 seconds.  Neurological:     General: No focal deficit present.     Mental Status: She is alert and oriented to person, place, and time. Mental status is at baseline.     Cranial Nerves: No cranial nerve deficit.  Psychiatric:        Mood and Affect: Mood normal.      ED Course/ Medical Decision Making/ A&P    Procedures Procedures   Medications Ordered in ED Medications  lactated ringers bolus 1,000 mL (has no administration in time range)  acetaminophen (TYLENOL) tablet 1,000 mg (has no administration in time range)  ondansetron (ZOFRAN) injection 4 mg (has no administration in time range)  molnupiravir EUA (LAGEVRIO) capsule 800 mg (has no administration in time range)  aerochamber plus with mask device 1 each (1  each Other Given 10/02/21 2030)   Medical Decision Making:   [place] L Burgher is a 87 y.o. female who presented to the ED today with subjective fever, cough, congestion detailed above.    Patient's presentation is complicated by their history of advanced age, anticoagulation use for A-fib, multiple comorbid medical problems.  Patient placed on continuous vitals and telemetry monitoring while in ED which was reviewed periodically.   Complete initial physical exam performed, notably the patient  was hemodynamically stable in no acute distress.  Posterior oropharynx illuminated and without obvious swelling or deformity.  Patient is without neck stiffness.    Reviewed and confirmed nursing  documentation for past medical history, family history, social history.    Initial Assessment:   With the patient's presentation of fever cough congestion, most likely diagnosis is developing viral upper respiratory infection. Other diagnoses were considered including (but not limited to) peritonsillar abscess, retropharyngeal abscess, pneumonia. These are considered less likely due to history of present illness and physical exam findings.   This is most consistent with an acute life/limb threatening illness complicated by underlying chronic conditions. Considered meningitis, however patient's symptoms, vital signs, physical exam findings including lack of meningismus seem grossly less consistent at this time. Initial Plan:  Screening labs including CBC and Metabolic panel to evaluate for infectious or metabolic etiology of disease.  Viral screening including COVID/flu testing to evaluate for common viral etiologies that need to be tracked CXR to evaluate for structural/infectious intrathoracic pathology.  Empiric treatment with antipyretics including acetaminophen in ambulatory setting Objective evaluation as below reviewed   Initial Study Results:   Laboratory  All laboratory results reviewed without evidence of clinically relevant pathology.    Radiology:  All images reviewed independently. Agree with radiology report at this time.   DG Chest [facility] 1 View  Final Result         Final Assessment and Plan:   On reassessment, patient is ambulatory tolerating p.o. intake in no acute distress.   Patient's COVID test was positive and patient is a candidate for treatment with Paxlovid due to age and comorbidities.  We will proceed with molnupiravir as she is currently anticoagulated and this has the least drug drug interaction checked on the [facility] COVID-19 drug interaction check  Patient is currently stable for outpatient care and management with no indication for hospitalization or  transfer at this time.  Discussed all findings with patient expressed understanding.  Disposition:  Based on the above findings, I believe patient is stable for discharge.    Patient/family educated about specific return precautions for given chief complaint and symptoms.  Patient/family educated about follow-up with PCP.     Patient/family expressed understanding of return precautions and need for follow-up. Patient spoken to regarding all imaging and laboratory results and appropriate follow up for these results. All education provided in verbal form with additional information in written form. Time was allowed for answering of patient questions. Patient discharged.    Emergency Department Medication Summary:   Medications  aerochamber plus with mask device 1 each (1 each Other Given 10/02/21 2030)  lactated ringers bolus 1,000 mL (0 mLs Intravenous Stopped 10/02/21 2241)  acetaminophen (TYLENOL) tablet 1,000 mg (1,000 mg Oral Given 10/02/21 2136)  ondansetron (ZOFRAN) injection 4 mg (4 mg Intravenous Given 10/02/21 2135)           Clinical Impression:  1. COVID      Discharge   Clinical Impression:  1. COVID  Data Unavailable   Final Clinical Impression(s) / ED Diagnoses Final diagnoses:  COVID    Rx / DC Orders ED Discharge Orders          Ordered    molnupiravir EUA (LAGEVRIO) 200 mg CAPS capsule  2 times daily        10/02/21 2117              Tretha Sciara, MD 10/02/21 2311

## 2021-10-02 NOTE — ED Notes (Signed)
RT educated pt and family on MDI w/spacer and the importance of taking her medication as prescribed for better relief of her asthma symptoms. Pt see pulmonology [place] for her asthma. RT also spoke to her son and discussed medication admin and deposition importance. Son agreed and verbalizes understanding of information. RT will continue to monitor while in [facility] ED.

## 2021-10-29 ENCOUNTER — Telehealth: Payer: Self-pay | Admitting: Cardiology

## 2021-10-29 NOTE — Telephone Encounter (Signed)
[place]-AF trial medication added to medication list.  Last dose of Xarelto taken on 16Jul2023.

## 2021-11-02 ENCOUNTER — Telehealth: Payer: Self-pay | Admitting: Cardiology

## 2021-11-02 NOTE — Telephone Encounter (Signed)
Patient called and stated she believes research medication is causing itching on arms, legs, and feet. This has been happening since patient began taking on 17Jul2023. Per Dr. Einar Gip, pt to hold study medication x 2 weeks, restart Xarelto immediately, and re-challenge study medication after 2 week hold. Patient is informed of the above recommendation and is agreeable to that plan. Last doses of study medication was on 22Oct2023. First dose of Xarelto will be tonight. Patient states she has plenty of Xarelto on hand and does not need a refill called to pharmacy at this time.

## 2021-11-09 ENCOUNTER — Telehealth: Payer: Self-pay

## 2021-11-09 DIAGNOSIS — I10 Essential (primary) hypertension: Secondary | ICD-10-CM

## 2021-11-09 DIAGNOSIS — R5383 Other fatigue: Secondary | ICD-10-CM

## 2021-11-09 DIAGNOSIS — I480 Paroxysmal atrial fibrillation: Secondary | ICD-10-CM

## 2021-11-09 NOTE — Telephone Encounter (Signed)
Pt says her watch has said she is not in afib, she will cut metoprolol in half and she will call back to make an appointment

## 2021-11-09 NOTE — Telephone Encounter (Signed)
Chief Complaint  Patient presents with   Fatigue     ICD-10-CM   1. Primary hypertension  I10     2. Paroxysmal atrial fibrillation (HCC)  I48.0 metoprolol succinate (TOPROL-XL) 100 MG 24 hr tablet    3. Malaise and fatigue  R53.81 metoprolol succinate (TOPROL-XL) 100 MG 24 hr tablet   R53.83      Medications Discontinued During This Encounter  Medication Reason   metoprolol succinate (TOPROL-XL) 100 MG 24 hr tablet     Patient called and complained about fatigue, suspect recent COVID infection could be the etiology for this.  If she has persistent symptoms after 2 weeks or so, she needs to come in to make sure she is not in atrial fibrillation.  We will reduce the dose of metoprolol succinate from 100 mg to 50 mg daily.

## 2021-11-09 NOTE — Telephone Encounter (Signed)
Patient called and said she is very weak since re starting xarelto she asked about lowering the dose. She said she cannot function or maybe its her bp meds.

## 2021-11-18 ENCOUNTER — Ambulatory Visit: Payer: BLUE CROSS/BLUE SHIELD | Admitting: Cardiology

## 2021-11-18 ENCOUNTER — Encounter (HOSPITAL_BASED_OUTPATIENT_CLINIC_OR_DEPARTMENT_OTHER): Payer: Self-pay

## 2021-11-18 ENCOUNTER — Emergency Department (HOSPITAL_BASED_OUTPATIENT_CLINIC_OR_DEPARTMENT_OTHER): Payer: Medicare Other

## 2021-11-18 ENCOUNTER — Emergency Department (HOSPITAL_BASED_OUTPATIENT_CLINIC_OR_DEPARTMENT_OTHER)
Admission: EM | Admit: 2021-11-18 | Discharge: 2021-11-18 | Disposition: A | Discharge status: Home / Self Care | Payer: Medicare Other | Attending: Emergency Medicine | Admitting: Emergency Medicine

## 2021-11-18 ENCOUNTER — Other Ambulatory Visit: Payer: Self-pay

## 2021-11-18 DIAGNOSIS — R5383 Other fatigue: Secondary | ICD-10-CM | POA: Insufficient documentation

## 2021-11-18 DIAGNOSIS — J45909 Unspecified asthma, uncomplicated: Secondary | ICD-10-CM | POA: Diagnosis not present

## 2021-11-18 DIAGNOSIS — Z7901 Long term (current) use of anticoagulants: Secondary | ICD-10-CM | POA: Insufficient documentation

## 2021-11-18 DIAGNOSIS — R001 Bradycardia, unspecified: Secondary | ICD-10-CM | POA: Diagnosis not present

## 2021-11-18 DIAGNOSIS — H6121 Impacted cerumen, right ear: Secondary | ICD-10-CM | POA: Diagnosis not present

## 2021-11-18 DIAGNOSIS — R519 Headache, unspecified: Secondary | ICD-10-CM | POA: Diagnosis not present

## 2021-11-18 DIAGNOSIS — Z79899 Other long term (current) drug therapy: Secondary | ICD-10-CM | POA: Insufficient documentation

## 2021-11-18 DIAGNOSIS — I10 Essential (primary) hypertension: Secondary | ICD-10-CM | POA: Insufficient documentation

## 2021-11-18 DIAGNOSIS — Z7951 Long term (current) use of inhaled steroids: Secondary | ICD-10-CM | POA: Insufficient documentation

## 2021-11-18 DIAGNOSIS — R531 Weakness: Secondary | ICD-10-CM | POA: Diagnosis not present

## 2021-11-18 LAB — URINALYSIS, ROUTINE W REFLEX MICROSCOPIC
Bilirubin Urine: NEGATIVE
Glucose, UA: NEGATIVE mg/dL
Hgb urine dipstick: NEGATIVE
Nitrite: NEGATIVE
Specific Gravity, Urine: 1.021 (ref 1.005–1.030)
pH: 6 (ref 5.0–8.0)

## 2021-11-18 LAB — BASIC METABOLIC PANEL
Anion gap: 11 (ref 5–15)
BUN: 23 mg/dL (ref 8–23)
CO2: 23 mmol/L (ref 22–32)
Calcium: 10.3 mg/dL (ref 8.9–10.3)
Chloride: 97 mmol/L — ABNORMAL LOW (ref 98–111)
Creatinine, Ser: 1.18 mg/dL — ABNORMAL HIGH (ref 0.44–1.00)
GFR, Estimated: 45 mL/min — ABNORMAL LOW (ref >60)
Glucose, Bld: 105 mg/dL — ABNORMAL HIGH (ref 70–99)
Potassium: 4.6 mmol/L (ref 3.5–5.1)
Sodium: 131 mmol/L — ABNORMAL LOW (ref 135–145)

## 2021-11-18 LAB — CBG MONITORING, ED: Glucose-Capillary: 102 mg/dL — ABNORMAL HIGH (ref 70–99)

## 2021-11-18 LAB — CBC
HCT: 47.2 % — ABNORMAL HIGH (ref 36.0–46.0)
Hemoglobin: 15.7 g/dL — ABNORMAL HIGH (ref 12.0–15.0)
MCH: 31.3 pg (ref 26.0–34.0)
MCHC: 33.3 g/dL (ref 30.0–36.0)
MCV: 94.2 fL (ref 80.0–100.0)
Platelets: 253 10*3/uL (ref 150–400)
RBC: 5.01 MIL/uL (ref 3.87–5.11)
RDW: 12.8 % (ref 11.5–15.5)
WBC: 9.2 10*3/uL (ref 4.0–10.5)
nRBC: 0 % (ref 0.0–0.2)

## 2021-11-18 NOTE — Discharge Instructions (Addendum)
We evaluated you for your fatigue.  Your lab testing was reassuring.  We did not see signs of any infection.  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  This is not related to your current symptoms but please follow-up with your primary doctor for it as you might need an MRI.  Please return to the emergency department if you develop any new or worsening symptoms such as fevers, chest pain, difficulty breathing, nausea or vomiting, abdominal pain, headaches, or any other concerning symptoms.

## 2021-11-18 NOTE — ED Notes (Signed)
Blood work was attempted. Provider informed. Provider decided to cancel additional blood work.

## 2021-11-18 NOTE — ED Triage Notes (Signed)
Pt reports ongoing generalized weakness and fatigue for the past few months. States symptoms have gradually worsened. Pt is alert and oriented x4. Pt denies any chest pain or sob. No other associated symptoms.

## 2021-11-18 NOTE — ED Notes (Signed)
Reviewed AVS/discharge instruction with patient. Time allotted for and all questions answered. Patient is agreeable for d/c and escorted to ed exit by staff.  

## 2021-11-18 NOTE — ED Provider Notes (Signed)
[facility] Provider Note  CSN: [id number] Arrival date & time: 11/18/21 1039  Chief Complaint(s) Weakness and Fatigue  HPI Deborah Mcmillan is a 87 y.o. female with history of hyperlipidemia, hypertension presenting to the emergency department with fatigue.  Patient reports she had fatigue around 2 months.  She reports this began after COVID.  She has not seen her primary doctor.  She also reports generalized weakness.  She denies any specific focal symptoms such as headaches, chest pain, shortness of breath, abdominal pain, numbness, tingling, weakness, fevers or chills, cough, weight loss, night sweats.  Symptoms are constant.  Past Medical History Past Medical History:  Diagnosis Date   Arthritis    Asthma    Complication of anesthesia    difficult to wake up and agitation   Dyslipidemia    Dysrhythmia    h/o atrial couplets with short PAT, h/o a-fib during surgery (event monitor)   GERD (gastroesophageal reflux disease)    History of nuclear stress test 05/11/2009   dipyridamole; normal, low risk    Hypertension    Osteoporosis    Pneumonia    PONV (postoperative nausea and vomiting)    Postoperative atrial fibrillation ([place]) 01/11/2009   Seasonal allergies    Patient Active Problem List   Diagnosis Date Noted   Salmonella gastroenteritis 04/15/2021   Hypokalemia 04/15/2021   Nausea and vomiting 04/15/2021   Liver lesion, right lobe 04/15/2021   Preoperative cardiovascular examination 11/14/2012   Postoperative atrial fibrillation ([place])    Essential hypertension    Dyslipidemia    Paroxysmal a-fib 11/14/2009   Home Medication(s) Prior to Admission medications   Medication Sig Start Date End Date Taking? Authorizing Provider  albuterol (VENTOLIN HFA) 108 (90 Base) MCG/ACT inhaler Inhale 1 puff into the lungs every 4 (four) hours as needed for wheezing or shortness of breath. 09/25/20   Hunsucker, Bonna Gains, MD  azithromycin (ZITHROMAX  Z-PAK) 250 MG tablet Take 2 tablet once then one tablet daily 06/18/21   Adrian Prows, MD  Cholecalciferol (VITAMIN D) 50 MCG (2000 UT) CAPS Take 2,000 Units by mouth daily.    [provider]  denosumab (PROLIA) 60 MG/ML SOSY injection Inject 60 mg into the skin every 6 (six) months.    [provider]  diltiazem (CARDIZEM CD) 120 MG 24 hr capsule TAKE 1 CAPSULE(120 MG) BY MOUTH DAILY 09/21/21   Adrian Prows, MD  fluticasone (FLONASE) 50 MCG/ACT nasal spray Place 2 sprays into both nostrils daily as needed. 10/08/21   [provider]  MAGNESIUM CITRATE PO Take 1 tablet by mouth in the morning.    [provider]  metoprolol succinate (TOPROL-XL) 100 MG 24 hr tablet Take 0.5 tablets (50 mg total) by mouth daily. Take with or immediately following a meal. 11/09/21   Adrian Prows, MD  Multiple Vitamin (MULTIVITAMIN WITH MINERALS) TABS tablet Take 1 tablet by mouth daily.    [provider]  omeprazole (PRILOSEC) 20 MG capsule Take 20 mg by mouth in the morning.    [provider]  ondansetron (ZOFRAN) 4 MG tablet Take 1 tablet (4 mg total) by mouth every 6 (six) hours. 10/02/21   Tretha Sciara, MD  ondansetron (ZOFRAN) 4 MG tablet Take 1 tablet (4 mg total) by mouth every 6 (six) hours. 10/02/21   Tretha Sciara, MD  PRALUENT 75 MG/ML SOAJ INJECT '75MG'$  UNDER THE SKIN EVERY 14 DAYS 08/03/21   Adrian Prows, MD  rivaroxaban (XARELTO) 20 MG TABS tablet Take  20 mg by mouth daily with supper. 11/02/21   Adrian Prows, MD  spironolactone (ALDACTONE) 50 MG tablet TAKE 1 TABLET BY MOUTH EVERY MORNING 09/21/21   Adrian Prows, MD                                                                                                                                    Past Surgical History Past Surgical History:  Procedure Laterality Date   APPENDECTOMY  1961   BLADDER SURGERY  2002   BREAST BIOPSY Left 05/09/2012   calcified and hyalinized fibroadenomatous and sclerosing  adenosis   CHOLECYSTECTOMY N/A 01/01/2021   Procedure: LAPAROSCOPIC CHOLECYSTECTOMY;  Surgeon: Clovis Riley, MD;  Location: [facility];  Service: General;  Laterality: N/A;   COSMETIC SURGERY  2011   1 episode of atrial fibrillation during this    [place]   hiatal   lump removal  05/06/2009   [facility]   NM MYOVIEW LTD  May 2011   No ischemia or infarction.   SHOULDER SURGERY Left Feb 2015   Dr. Veverly Fells - arthroscopy (rotator cuff ?debridement plus biceps tenodesis)   TRANSTHORACIC ECHOCARDIOGRAM  05/2009   EF=>55%, with normal LV systolic function, impaired LV relaxation; trace MR/TR; mild AV regurg   Family History Family History  Problem Relation Age of Onset   Cancer Father    Cancer - Other Father    Cancer Brother    Cancer - Other Brother    Stroke Mother    Cancer - Other Sister    Cancer - Other Sister    Breast cancer Neg Hx     Social History Social History   Tobacco Use   Smoking status: Never   Smokeless tobacco: Never  Vaping Use   Vaping Use: Never used  Substance Use Topics   Alcohol use: No   Drug use: No   Allergies Calcium-containing compounds, Magnesium-containing compounds, Tape, Levofloxacin, Nitrofurantoin, Triamcinolone, Triamcinolone acetonide, Welchol [colesevelam hcl], Cefuroxime axetil, Codeine, Crestor [rosuvastatin], Lodine [etodolac], Niacin and related, Penicillins, Reclast [zoledronic acid], Sulfa antibiotics, Tetracyclines & related, Welchol [colesevelam], and Zetia [ezetimibe]  Review of Systems Review of Systems  All other systems reviewed and are negative.   Physical Exam Vital Signs  I have reviewed the triage vital signs BP 106/78   Pulse 64   Temp 98.2 F (36.8 C)   Resp (!) 22   SpO2 100%  Physical Exam Vitals and nursing note reviewed.  Constitutional:      General: She is not in acute distress.    Appearance: She is well-developed.  HENT:     Head: Normocephalic and atraumatic.     Right Ear: There is  impacted cerumen.     Left Ear: Tympanic membrane normal.     Mouth/Throat:     Mouth: Mucous membranes are moist.  Eyes:     Pupils: Pupils are equal, round, and reactive to light.  Cardiovascular:     Rate and Rhythm: Normal rate and regular rhythm.     Heart sounds: No murmur heard. Pulmonary:     Effort: Pulmonary effort is normal. No respiratory distress.     Breath sounds: Normal breath sounds.  Abdominal:     General: Abdomen is flat.     Palpations: Abdomen is soft.     Tenderness: There is no abdominal tenderness.  Musculoskeletal:        General: No tenderness.     Right lower leg: No edema.     Left lower leg: No edema.  Skin:    General: Skin is warm and dry.  Neurological:     General: No focal deficit present.     Mental Status: She is alert. Mental status is at baseline.  Psychiatric:        Mood and Affect: Mood normal.        Behavior: Behavior normal.     ED Results and Treatments Labs (all labs ordered are listed, but only abnormal results are displayed) Labs Reviewed  BASIC METABOLIC PANEL - Abnormal; Notable for the following components:      Result Value   Sodium 131 (*)    Chloride 97 (*)    Glucose, Bld 105 (*)    Creatinine, Ser 1.18 (*)    GFR, Estimated 45 (*)    All other components within normal limits  CBC - Abnormal; Notable for the following components:   Hemoglobin 15.7 (*)    HCT 47.2 (*)    All other components within normal limits  URINALYSIS, ROUTINE W REFLEX MICROSCOPIC - Abnormal; Notable for the following components:   APPearance HAZY (*)    Ketones, ur TRACE (*)    Protein, ur TRACE (*)    Leukocytes,Ua SMALL (*)    Non Squamous Epithelial 0-5 (*)    All other components within normal limits  CBG MONITORING, ED - Abnormal; Notable for the following components:   Glucose-Capillary 102 (*)    All other components within normal limits                                                                                                                           Radiology CT Head Wo Contrast  Result Date: 11/18/2021 CLINICAL DATA:  Headache EXAM: CT HEAD WITHOUT CONTRAST TECHNIQUE: Contiguous axial images were obtained from the base of the skull through the vertex without intravenous contrast. RADIATION DOSE REDUCTION: This exam was performed according to the departmental dose-optimization program which includes automated exposure control, adjustment of the mA and/or kV according to patient size and/or use of iterative reconstruction technique. COMPARISON:  None Available. FINDINGS: Brain: Large draining vein in the left frontal lobe draining towards the superior sagittal sinus. Branching vessel of seen extending down to the left lateral ventricle. 5 x 7 mm calcification left periventricular white matter likely is related to the developmental venous anomaly. No acute hemorrhage Ventricle size normal.  No  acute infarct, hemorrhage, mass Vascular: Negative for hyperdense vessel Skull: Negative Sinuses/Orbits: Paranasal sinuses clear.  Negative orbit Other: None IMPRESSION: 1. No acute abnormality. 2. Large developmental venous anomaly in the left frontal lobe. This is most likely an incidental finding. Recommend MRI brain without and with contrast to further evaluate this finding. 3. 5 x 7 mm calcification left periventricular white matter likely related to the developmental venous anomaly. Electronically Signed   By: Franchot Gallo M.D.   On: 11/18/2021 14:45   DG Chest Port 1 View  Result Date: 11/18/2021 CLINICAL DATA:  Generalized weakness and fatigue. EXAM: PORTABLE CHEST 1 VIEW COMPARISON:  10/02/2021 FINDINGS: The cardiac silhouette, mediastinal and hilar contours are within normal limits and stable. The lungs are clear of an acute process. Mild chronic bronchitic changes. No pleural effusions. No pulmonary lesions. IMPRESSION: No acute cardiopulmonary findings. Electronically Signed   By: Marijo Sanes M.D.   On: 11/18/2021  13:15    Pertinent labs & imaging results that were available during my care of the patient were reviewed by me and considered in my medical decision making (see MDM for details).  Medications Ordered in ED Medications - No data to display                                                                                                                                   Procedures Procedures  (including critical care time)  Medical Decision Making / ED Course   MDM:  87 year old female presenting to the emergency department with fatigue.  Differential included occult infectious process, toxic or metabolic abnormality, intracranial process, post-COVID syndrome, depression.  Work-up overall reassuring including normal chest X -ray, urinalysis without evidence of pneumonia or UTI.  Electrolytes reassuring with no hyponatremia.  CT scan of the head performed with no evidence of intracranial tumor, notable for incidental venous abnormality likely congenital, which was discussed with the patient as well as recommendation for outpatient MRI.  She has normal neurologic exam in the emergency department so doubt this is contributing to her current symptoms.  Most likely cause of patient's symptoms is post COVID syndrome given that her symptoms all began after contracting COVID.  Advised importance of continued follow-up with primary doctor for further management of her symptoms.      Additional history obtained: -Additional history obtained from family -External records from outside source obtained and reviewed including: Chart review including previous notes, labs, imaging, consultation notes including visit for [place] 10/02/21   Lab Tests: -I ordered, reviewed, and interpreted labs.   The pertinent results include:   Labs Reviewed  BASIC METABOLIC PANEL - Abnormal; Notable for the following components:      Result Value   Sodium 131 (*)    Chloride 97 (*)    Glucose, Bld 105 (*)     Creatinine, Ser 1.18 (*)    GFR, Estimated 45 (*)    All other components  within normal limits  CBC - Abnormal; Notable for the following components:   Hemoglobin 15.7 (*)    HCT 47.2 (*)    All other components within normal limits  URINALYSIS, ROUTINE W REFLEX MICROSCOPIC - Abnormal; Notable for the following components:   APPearance HAZY (*)    Ketones, ur TRACE (*)    Protein, ur TRACE (*)    Leukocytes,Ua SMALL (*)    Non Squamous Epithelial 0-5 (*)    All other components within normal limits  CBG MONITORING, ED - Abnormal; Notable for the following components:   Glucose-Capillary 102 (*)    All other components within normal limits    Notable for no sign of UTI, normal hgb, reassuring electrolytes  EKG   EKG Interpretation  Date/Time:  Wednesday November 18 2021 11:11:00 EST Ventricular Rate:  58 PR Interval:  168 QRS Duration: 84 QT Interval:  364 QTC Calculation: 357 R Axis:   42 Text Interpretation: Sinus bradycardia Otherwise normal ECG When compared with ECG of 02-Oct-2021 19:46, PREVIOUS ECG IS PRESENT Confirmed by Garnette Gunner [phone]) on 11/18/2021 12:58:00 PM         Imaging Studies ordered: I ordered imaging studies including CT head, CXR On my interpretation imaging demonstrates no acute process, congenital ?venous anomaly per radiology I independently visualized and interpreted imaging. I agree with the radiologist interpretation   Medicines ordered and prescription drug management: No orders of the defined types were placed in this encounter.   -I have reviewed the patients home medicines and have made adjustments as needed   Social Determinants of Health:  Diagnosis or treatment significantly limited by social determinants of health: lives alone   Co morbidities that complicate the patient evaluation  Past Medical History:  Diagnosis Date   Arthritis    Asthma    Complication of anesthesia    difficult to wake up and agitation    Dyslipidemia    Dysrhythmia    h/o atrial couplets with short PAT, h/o a-fib during surgery (event monitor)   GERD (gastroesophageal reflux disease)    History of nuclear stress test 05/11/2009   dipyridamole; normal, low risk    Hypertension    Osteoporosis    Pneumonia    PONV (postoperative nausea and vomiting)    Postoperative atrial fibrillation ([place]) 01/11/2009   Seasonal allergies       Dispostion: Disposition decision including need for hospitalization was considered, and patient discharged from emergency department.    Final Clinical Impression(s) / ED Diagnoses Final diagnoses:  Other fatigue     This chart was dictated using voice recognition software.  Despite best efforts to proofread,  errors can occur which can change the documentation meaning.    Cristie Hem, MD 11/18/21 1526

## 2021-11-24 DIAGNOSIS — Q283 Other malformations of cerebral vessels: Secondary | ICD-10-CM | POA: Diagnosis not present

## 2021-11-24 DIAGNOSIS — G9332 Myalgic encephalomyelitis/chronic fatigue syndrome: Secondary | ICD-10-CM | POA: Diagnosis not present

## 2021-11-24 DIAGNOSIS — Z09 Encounter for follow-up examination after completed treatment for conditions other than malignant neoplasm: Secondary | ICD-10-CM | POA: Diagnosis not present

## 2021-11-24 DIAGNOSIS — J069 Acute upper respiratory infection, unspecified: Secondary | ICD-10-CM | POA: Diagnosis not present

## 2021-11-24 DIAGNOSIS — L659 Nonscarring hair loss, unspecified: Secondary | ICD-10-CM | POA: Diagnosis not present

## 2021-11-24 DIAGNOSIS — U099 Post covid-19 condition, unspecified: Secondary | ICD-10-CM | POA: Diagnosis not present

## 2021-11-24 DIAGNOSIS — R5383 Other fatigue: Secondary | ICD-10-CM | POA: Diagnosis not present

## 2021-11-24 DIAGNOSIS — E559 Vitamin D deficiency, unspecified: Secondary | ICD-10-CM | POA: Diagnosis not present

## 2021-11-24 DIAGNOSIS — J0100 Acute maxillary sinusitis, unspecified: Secondary | ICD-10-CM | POA: Diagnosis not present

## 2021-11-24 DIAGNOSIS — E871 Hypo-osmolality and hyponatremia: Secondary | ICD-10-CM | POA: Diagnosis not present

## 2021-11-25 DIAGNOSIS — R5381 Other malaise: Secondary | ICD-10-CM | POA: Diagnosis not present

## 2021-11-25 DIAGNOSIS — E559 Vitamin D deficiency, unspecified: Secondary | ICD-10-CM | POA: Diagnosis not present

## 2021-11-25 DIAGNOSIS — Z79899 Other long term (current) drug therapy: Secondary | ICD-10-CM | POA: Diagnosis not present

## 2021-11-25 DIAGNOSIS — G9332 Myalgic encephalomyelitis/chronic fatigue syndrome: Secondary | ICD-10-CM | POA: Diagnosis not present

## 2021-11-25 DIAGNOSIS — L659 Nonscarring hair loss, unspecified: Secondary | ICD-10-CM | POA: Diagnosis not present

## 2021-11-26 ENCOUNTER — Encounter: Payer: Self-pay | Admitting: Cardiology

## 2021-11-26 ENCOUNTER — Other Ambulatory Visit: Payer: Self-pay | Admitting: Registered Nurse

## 2021-11-26 DIAGNOSIS — Q283 Other malformations of cerebral vessels: Secondary | ICD-10-CM

## 2021-11-26 NOTE — Telephone Encounter (Signed)
From patient.

## 2021-12-01 DIAGNOSIS — Z79899 Other long term (current) drug therapy: Secondary | ICD-10-CM | POA: Diagnosis not present

## 2021-12-02 DIAGNOSIS — R768 Other specified abnormal immunological findings in serum: Secondary | ICD-10-CM | POA: Diagnosis not present

## 2021-12-02 DIAGNOSIS — E871 Hypo-osmolality and hyponatremia: Secondary | ICD-10-CM | POA: Diagnosis not present

## 2021-12-02 DIAGNOSIS — E538 Deficiency of other specified B group vitamins: Secondary | ICD-10-CM | POA: Diagnosis not present

## 2021-12-02 DIAGNOSIS — J329 Chronic sinusitis, unspecified: Secondary | ICD-10-CM | POA: Diagnosis not present

## 2021-12-02 DIAGNOSIS — B9689 Other specified bacterial agents as the cause of diseases classified elsewhere: Secondary | ICD-10-CM | POA: Diagnosis not present

## 2021-12-07 ENCOUNTER — Encounter: Payer: Self-pay | Admitting: Cardiology

## 2021-12-07 NOTE — Progress Notes (Signed)
Labs 12/01/2021:  TSH normal at 0.93.  Vitamin D 67.6.  Other routine labs are available on epic

## 2021-12-10 DIAGNOSIS — M545 Low back pain, unspecified: Secondary | ICD-10-CM | POA: Diagnosis not present

## 2021-12-10 DIAGNOSIS — R5383 Other fatigue: Secondary | ICD-10-CM | POA: Diagnosis not present

## 2021-12-10 DIAGNOSIS — M2550 Pain in unspecified joint: Secondary | ICD-10-CM | POA: Diagnosis not present

## 2021-12-10 DIAGNOSIS — M79642 Pain in left hand: Secondary | ICD-10-CM | POA: Diagnosis not present

## 2021-12-10 DIAGNOSIS — M79641 Pain in right hand: Secondary | ICD-10-CM | POA: Diagnosis not present

## 2021-12-10 DIAGNOSIS — M79672 Pain in left foot: Secondary | ICD-10-CM | POA: Diagnosis not present

## 2021-12-10 DIAGNOSIS — M1990 Unspecified osteoarthritis, unspecified site: Secondary | ICD-10-CM | POA: Diagnosis not present

## 2021-12-10 DIAGNOSIS — R768 Other specified abnormal immunological findings in serum: Secondary | ICD-10-CM | POA: Diagnosis not present

## 2021-12-10 DIAGNOSIS — M79671 Pain in right foot: Secondary | ICD-10-CM | POA: Diagnosis not present

## 2021-12-21 ENCOUNTER — Ambulatory Visit: Payer: Medicare Other | Admitting: Cardiology

## 2021-12-21 ENCOUNTER — Encounter: Payer: Self-pay | Admitting: Cardiology

## 2021-12-21 VITALS — BP 130/65 | HR 73 | Resp 16 | Ht 60.0 in | Wt 160.8 lb

## 2021-12-21 DIAGNOSIS — I10 Essential (primary) hypertension: Secondary | ICD-10-CM

## 2021-12-21 DIAGNOSIS — E7800 Pure hypercholesterolemia, unspecified: Secondary | ICD-10-CM | POA: Diagnosis not present

## 2021-12-21 DIAGNOSIS — R5381 Other malaise: Secondary | ICD-10-CM

## 2021-12-21 DIAGNOSIS — I480 Paroxysmal atrial fibrillation: Secondary | ICD-10-CM | POA: Diagnosis not present

## 2021-12-21 DIAGNOSIS — R5383 Other fatigue: Secondary | ICD-10-CM | POA: Diagnosis not present

## 2021-12-21 NOTE — Progress Notes (Signed)
Primary Physician/Referring:  Holland Commons, FNP  Patient ID: Deborah Mcmillan, female    DOB: [birth year], 87 y.o.   MRN: [id number]  Chief Complaint  Patient presents with  . Atrial Fibrillation  . Hyperlipidemia  . Follow-up    6 months   HPI:    Deborah L Douds  is a 87 y.o.  Caucasian female with carotid atheresclerosis and hypertension and hyperlipidemia, LDL in the range of 170-190, however she has not been able to tolerate any statins.  She is now on Praluent. She has bronchial asthma and multiple medication allergies and intolerances.    Patient with perioperative atrial fibrillation in 2011 after minor skin surgery, spontaneously converted to sinus rhythm, has had recurrence in August 2022 and also May 2023 with spontaneous conversion to sinus rhythm.   She has been on Xarelto since then, patient prefers to continue anticoagulation.  She was diagnosed with COVID in September 2023 and since then has had mild fatigue, decreased exercise tolerance and skin changes and nail changes.  Her main complaint today continues to be marked fatigue and malaise and states that she cannot live like this.  She has not had any chest pain or palpitations.  No dyspnea, no leg edema.  Past Medical History:  Diagnosis Date  . Arthritis   . Asthma   . Complication of anesthesia    difficult to wake up and agitation  . Dyslipidemia   . Dysrhythmia    h/o atrial couplets with short PAT, h/o a-fib during surgery (event monitor)  . GERD (gastroesophageal reflux disease)   . History of nuclear stress test 05/11/2009   dipyridamole; normal, low risk   . Hypertension   . Osteoporosis   . Pneumonia   . PONV (postoperative nausea and vomiting)   . Postoperative atrial fibrillation ([place]) 01/11/2009  . Seasonal allergies    Past Surgical History:  Procedure Laterality Date  . APPENDECTOMY  1961  . BLADDER SURGERY  2002  . BREAST BIOPSY Left 05/09/2012   calcified and hyalinized  fibroadenomatous and sclerosing adenosis  . CHOLECYSTECTOMY N/A 01/01/2021   Procedure: LAPAROSCOPIC CHOLECYSTECTOMY;  Surgeon: Clovis Riley, MD;  Location: [facility];  Service: General;  Laterality: N/A;  . COSMETIC SURGERY  2011   1 episode of atrial fibrillation during this   . HERNIA REPAIR  1999   hiatal  . lump removal  05/06/2009   [facility]  . NM MYOVIEW LTD  May 2011   No ischemia or infarction.  [place] SHOULDER SURGERY Left Feb 2015   Dr. Veverly Fells - arthroscopy (rotator cuff ?debridement plus biceps tenodesis)  . TRANSTHORACIC ECHOCARDIOGRAM  05/2009   EF=>55%, with normal LV systolic function, impaired LV relaxation; trace MR/TR; mild AV regurg   Family History  Problem Relation Age of Onset  . Cancer Father   . Cancer - Other Father   . Cancer Brother   . Cancer - Other Brother   . Stroke Mother   . Cancer - Other Sister   . Cancer - Other Sister   . Breast cancer Neg Hx     Social History   Tobacco Use  . Smoking status: Never  . Smokeless tobacco: Never  Substance Use Topics  . Alcohol use: No   ROS  Review of Systems  Constitutional: Positive for malaise/fatigue.  Cardiovascular:  Negative for chest pain, dyspnea on exertion and leg swelling.  Gastrointestinal:  Negative for melena.   Objective  Blood pressure 130/65, pulse 73, resp. rate  16, height 5' (1.524 m), weight 160 lb 12.8 oz (72.9 kg), SpO2 98 %.     12/21/2021   10:49 AM 11/18/2021    3:00 PM 11/18/2021    1:35 PM  Vitals with BMI  Height '5\' 0"'$     Weight 160 lbs 13 oz    BMI 24.2    Systolic 353 614 431  Diastolic 65 78 80  Pulse 73 64 60    Orthostatic VS for the past 72 hrs (Last 3 readings):  Orthostatic BP Patient Position BP Location Cuff Size Orthostatic Pulse  12/21/21 1116 130/70 Sitting Left Arm -- 66  12/21/21 1115 120/60 Standing Left Arm -- 98  12/21/21 1049 -- Sitting Left Arm Normal --     Physical Exam Neck:     Vascular: No carotid bruit or JVD.  Cardiovascular:     Rate  and Rhythm: Normal rate and regular rhythm.     Pulses: Intact distal pulses.     Heart sounds: Normal heart sounds. No murmur heard.    No gallop.  Pulmonary:     Effort: Pulmonary effort is normal.     Breath sounds: Normal breath sounds.  Abdominal:     General: Bowel sounds are normal.     Palpations: Abdomen is soft.  Musculoskeletal:     Right lower leg: No edema.     Left lower leg: No edema.   Laboratory examination:   Recent Labs    06/08/21 1144 10/02/21 2145 11/18/21 1118  NA 137 128* 131*  K 4.9 4.4 4.6  CL 103 93* 97*  CO2 24 20* 23  GLUCOSE 117* 89 105*  BUN 26* 28* 23  CREATININE 1.00 1.05* 1.18*  CALCIUM 9.9 10.0 10.3  GFRNONAA 55* 52* 45*       Latest Ref Rng & Units 11/18/2021   11:18 AM 10/02/2021    9:45 PM 06/08/2021   11:44 AM  CMP  Glucose 70 - 99 mg/dL 105  89  117   BUN 8 - 23 mg/dL '23  28  26   '$ Creatinine 0.44 - 1.00 mg/dL 1.18  1.05  1.00   Sodium 135 - 145 mmol/L 131  128  137   Potassium 3.5 - 5.1 mmol/L 4.6  4.4  4.9   Chloride 98 - 111 mmol/L 97  93  103   CO2 22 - 32 mmol/L '23  20  24   '$ Calcium 8.9 - 10.3 mg/dL 10.3  10.0  9.9       Latest Ref Rng & Units 11/18/2021   11:18 AM 10/02/2021    9:45 PM 06/08/2021   11:44 AM  CBC  WBC 4.0 - 10.5 K/uL 9.2  8.7  10.5   Hemoglobin 12.0 - 15.0 g/dL 15.7  15.8  15.2   Hematocrit 36.0 - 46.0 % 47.2  44.3  45.8   Platelets 150 - 400 K/uL 253  177  295     External labs:    TSH normal at 0.93.  Vitamin D 67.6.  Labs 04/30/2021:  Total cholesterol 163, triglycerides 86, HDL 71, LDL 75.  TSH normal at 1.54.  Vitamin D 26.7.  Medications and allergies   Allergies  Allergen Reactions  . Calcium-Containing Compounds Other (See Comments)    Muscle cramping  . Magnesium-Containing Compounds Itching and Rash  . Tape Itching and Rash    Plastic, and "brown tape"  . Levofloxacin Other (See Comments)  . Nitrofurantoin Other (See Comments)    Unknown  .  Triamcinolone Other (See  Comments)  . Triamcinolone Acetonide Other (See Comments)  . Welchol [Colesevelam Hcl] Other (See Comments)    Unknown  . Cefuroxime Axetil Other (See Comments)    leg pain  . Codeine Nausea Only    Pain & upset stomach  . Crestor [Rosuvastatin] Other (See Comments)    Leg cramping  . Lodine [Etodolac] Other (See Comments)    Upset stomach  . Niacin And Related Rash    "on fire"  . Penicillins Itching, Swelling and Rash    She reports some swelling and itchy.   . Reclast [Zoledronic Acid] Other (See Comments)    Caused severe jaw pain  . Sulfa Antibiotics Other (See Comments)    Pain & upset stomach  . Tetracyclines & Related Rash    Upset stomach  . Welchol [Colesevelam] Nausea Only and Other (See Comments)    Stomach did not feel good  . Zetia [Ezetimibe] Other (See Comments)    Muscle pain     Current Outpatient Medications:  .  albuterol (VENTOLIN HFA) 108 (90 Base) MCG/ACT inhaler, Inhale 1 puff into the lungs every 4 (four) hours as needed for wheezing or shortness of breath., Disp: 1 each, Rfl: 11 .  Cholecalciferol (VITAMIN D) 50 MCG (2000 UT) CAPS, Take 2,000 Units by mouth daily., Disp: , Rfl:  .  denosumab (PROLIA) 60 MG/ML SOSY injection, Inject 60 mg into the skin every 6 (six) months., Disp: , Rfl:  .  diltiazem (CARDIZEM CD) 120 MG 24 hr capsule, TAKE 1 CAPSULE(120 MG) BY MOUTH DAILY, Disp: 90 capsule, Rfl: 1 .  fluticasone (FLONASE) 50 MCG/ACT nasal spray, Place 2 sprays into both nostrils daily as needed., Disp: , Rfl:  .  metoprolol succinate (TOPROL-XL) 100 MG 24 hr tablet, Take 0.5 tablets (50 mg total) by mouth daily. Take with or immediately following a meal., Disp: 90 tablet, Rfl: 1 .  Multiple Vitamin (MULTIVITAMIN WITH MINERALS) TABS tablet, Take 1 tablet by mouth daily., Disp: , Rfl:  .  omeprazole (PRILOSEC) 20 MG capsule, Take 20 mg by mouth in the morning., Disp: , Rfl:  .  PRALUENT 75 MG/ML SOAJ, INJECT '75MG'$  UNDER THE SKIN EVERY 14 DAYS, Disp: 6 mL,  Rfl: 3 .  rivaroxaban (XARELTO) 20 MG TABS tablet, Take 20 mg by mouth daily with supper., Disp: , Rfl:  .  spironolactone (ALDACTONE) 50 MG tablet, TAKE 1 TABLET BY MOUTH EVERY MORNING, Disp: 90 tablet, Rfl: 1    Radiology:   Chest x-ray 06/08/2021: Cardiac silhouette is normal in size. Small hiatal hernia. No mediastinal or hilar masses. No evidence of adenopathy.   Clear lungs.  No pleural effusion or pneumothorax.  Cardiac Studies:   Carotid artery duplex 10-05-2015: No hemodynamically significant arterial disease in the internal carotid artery bilaterally. Minimal mixed plaque noted. Antegrade vertebral artery flow.  Nuclear stress test   [12/11/2015]:  1. The resting electrocardiogram demonstrated normal sinus rhythm, normal resting conduction, no resting arrhythmias and normal rest repolarization. Stress EKG is non-diagnostic for ischemia as it a pharmacologic stress using Lexiscan. Stress symptoms included dyspnea. 2. Myocardial perfusion imaging is normal. Overall left ventricular systolic function was normal without regional wall motion abnormalities. The left ventricular ejection fraction was 58%.  PCV ECHOCARDIOGRAM COMPLETE 98/92/1194 Normal LV systolic function with visual EF 60-65%. Left ventricle cavity is normal in size. Normal global wall motion. Indeterminate diastolic filling pattern, normal LAP. Left atrial cavity is mildly dilated. Mild (Grade I) aortic regurgitation. Mild (Grade  I) mitral regurgitation. Mild tricuspid regurgitation. No evidence of pulmonary hypertension. RVSP measures 30 mmHg. Mild pulmonic regurgitation. Prior study dated 12/09/2015: LVEF 28%, normal diastolic function, mild LAE, mild to moderate AR, Mild to moderate MR, Mild TR, no PHTN.     EKG  EKG 12/21/2021: Normal sinus rhythm at rate of 58 baseline, normal EKG Compared to 06/18/2021, no change.  Assessment     ICD-10-CM   1. Paroxysmal atrial fibrillation (HCC)  I48.0 EKG 12-Lead     2. Primary hypertension  I10     3. Hypercholesteremia  E78.00     4. Malaise and fatigue  R53.81    R53.83       CHA2DS2-VASc Score is 4.  Yearly risk of stroke: 4.8% (A, HTN, Female).  Score of 1=0.6; 2=2.2; 3=3.2; 4=4.8; 5=7.2; 6=9.8; 7=>9.8) -(CHF; HTN; vasc disease DM,  Female = 1; Age <65 =0; 65-74 = 1,  >75 =2; stroke/embolism= 2).    No orders of the defined types were placed in this encounter.   Medications Discontinued During This Encounter  Medication Reason  . azithromycin (ZITHROMAX Z-PAK) 250 MG tablet Completed Course  . MAGNESIUM CITRATE PO Side effect (s)  . ondansetron (ZOFRAN) 4 MG tablet   . ondansetron (ZOFRAN) 4 MG tablet    Orders Placed This Encounter  Procedures  . EKG 12-Lead      Recommendations:   Deborah L Gudger  is a 87 y.o. Caucasian female with carotid atheresclerosis and hypertension and hyperlipidemia, LDL in the range of 170-190, however she has not been able to tolerate any statins.  She is now on Praluent. She has bronchial asthma and multiple medication allergies and intolerances.    Patient with perioperative atrial fibrillation in 2011 after minor skin surgery, spontaneously converted to sinus rhythm, has had recurrence in August 2022 and also May 2023 with spontaneous conversion to sinus rhythm.   She has been on Xarelto since then, patient prefers to continue anticoagulation.  She was diagnosed with COVID in September 2023 and since then has had mild fatigue, decreased exercise tolerance and skin changes and nail changes.  1. Paroxysmal atrial fibrillation [facility]) Patient has not had any recurrent and recent episodes of atrial fibrillation.  She is presently on anticoagulation, continue the same.  2. Primary hypertension Blood pressure is well-controlled, she is mildly orthostatic with mild drop in blood pressure of 10 mmHg with elevated heart rate however remains asymptomatic.  3. Hypercholesteremia Lipids are well-controlled on  Praluent, this is done for carotid atherosclerosis.  Lipids are at goal.  Continue present therapy.  4. Malaise and fatigue She continues to complain of mild fatigue and malaise and states that she cannot live like this.  She has multiple medication allergies as well.  Suspect this could be post-COVID syndrome however she states that symptoms started way before COVID, she has had tick bite about 3 to 4 years ago, Lyme disease is a possibility, she can follow-up with her PCP regarding this.  I advised her that she could certainly try to stop Praluent for a month to see if her symptoms would improve, she could also try to change Xarelto to Eliquis to see if her symptoms would improve.  Patient will let us know.   Adrian Prows, MD, [facility] 12/21/2021, 11:22 AM Office: [phone] Pager: [phone]

## 2021-12-26 ENCOUNTER — Other Ambulatory Visit: Payer: Medicare Other

## 2022-01-09 ENCOUNTER — Other Ambulatory Visit: Payer: Self-pay | Admitting: Cardiology

## 2022-01-11 HISTORY — PX: CATARACT EXTRACTION: SUR2

## 2022-01-19 DIAGNOSIS — H18413 Arcus senilis, bilateral: Secondary | ICD-10-CM | POA: Diagnosis not present

## 2022-01-19 DIAGNOSIS — H2512 Age-related nuclear cataract, left eye: Secondary | ICD-10-CM | POA: Diagnosis not present

## 2022-01-19 DIAGNOSIS — H25043 Posterior subcapsular polar age-related cataract, bilateral: Secondary | ICD-10-CM | POA: Diagnosis not present

## 2022-01-19 DIAGNOSIS — H2513 Age-related nuclear cataract, bilateral: Secondary | ICD-10-CM | POA: Diagnosis not present

## 2022-01-19 DIAGNOSIS — H25013 Cortical age-related cataract, bilateral: Secondary | ICD-10-CM | POA: Diagnosis not present

## 2022-02-01 DIAGNOSIS — H2512 Age-related nuclear cataract, left eye: Secondary | ICD-10-CM | POA: Diagnosis not present

## 2022-02-02 DIAGNOSIS — H2511 Age-related nuclear cataract, right eye: Secondary | ICD-10-CM | POA: Diagnosis not present

## 2022-02-15 DIAGNOSIS — H2511 Age-related nuclear cataract, right eye: Secondary | ICD-10-CM | POA: Diagnosis not present

## 2022-02-18 DIAGNOSIS — Z23 Encounter for immunization: Secondary | ICD-10-CM | POA: Diagnosis not present

## 2022-02-18 DIAGNOSIS — H9193 Unspecified hearing loss, bilateral: Secondary | ICD-10-CM | POA: Diagnosis not present

## 2022-02-18 DIAGNOSIS — H9201 Otalgia, right ear: Secondary | ICD-10-CM | POA: Diagnosis not present

## 2022-02-18 DIAGNOSIS — E538 Deficiency of other specified B group vitamins: Secondary | ICD-10-CM | POA: Diagnosis not present

## 2022-02-18 DIAGNOSIS — H6121 Impacted cerumen, right ear: Secondary | ICD-10-CM | POA: Diagnosis not present

## 2022-03-01 ENCOUNTER — Other Ambulatory Visit: Payer: Medicare Other

## 2022-03-27 ENCOUNTER — Other Ambulatory Visit: Payer: Self-pay | Admitting: Cardiology

## 2022-03-27 DIAGNOSIS — I10 Essential (primary) hypertension: Secondary | ICD-10-CM

## 2022-03-27 DIAGNOSIS — I480 Paroxysmal atrial fibrillation: Secondary | ICD-10-CM

## 2022-04-08 DIAGNOSIS — E559 Vitamin D deficiency, unspecified: Secondary | ICD-10-CM | POA: Diagnosis not present

## 2022-04-08 DIAGNOSIS — E7800 Pure hypercholesterolemia, unspecified: Secondary | ICD-10-CM | POA: Diagnosis not present

## 2022-04-08 DIAGNOSIS — G9332 Myalgic encephalomyelitis/chronic fatigue syndrome: Secondary | ICD-10-CM | POA: Diagnosis not present

## 2022-04-14 DIAGNOSIS — G9332 Myalgic encephalomyelitis/chronic fatigue syndrome: Secondary | ICD-10-CM | POA: Diagnosis not present

## 2022-04-15 DIAGNOSIS — E538 Deficiency of other specified B group vitamins: Secondary | ICD-10-CM | POA: Diagnosis not present

## 2022-04-15 DIAGNOSIS — E7800 Pure hypercholesterolemia, unspecified: Secondary | ICD-10-CM | POA: Diagnosis not present

## 2022-04-15 DIAGNOSIS — H6123 Impacted cerumen, bilateral: Secondary | ICD-10-CM | POA: Diagnosis not present

## 2022-04-15 DIAGNOSIS — E559 Vitamin D deficiency, unspecified: Secondary | ICD-10-CM | POA: Diagnosis not present

## 2022-04-15 DIAGNOSIS — J45909 Unspecified asthma, uncomplicated: Secondary | ICD-10-CM | POA: Diagnosis not present

## 2022-04-15 DIAGNOSIS — M810 Age-related osteoporosis without current pathological fracture: Secondary | ICD-10-CM | POA: Diagnosis not present

## 2022-04-15 DIAGNOSIS — T466X5A Adverse effect of antihyperlipidemic and antiarteriosclerotic drugs, initial encounter: Secondary | ICD-10-CM | POA: Diagnosis not present

## 2022-04-15 DIAGNOSIS — G720 Drug-induced myopathy: Secondary | ICD-10-CM | POA: Diagnosis not present

## 2022-04-15 DIAGNOSIS — Z0000 Encounter for general adult medical examination without abnormal findings: Secondary | ICD-10-CM | POA: Diagnosis not present

## 2022-04-15 DIAGNOSIS — I10 Essential (primary) hypertension: Secondary | ICD-10-CM | POA: Diagnosis not present

## 2022-04-19 ENCOUNTER — Other Ambulatory Visit: Payer: Self-pay | Admitting: Cardiology

## 2022-04-19 DIAGNOSIS — I480 Paroxysmal atrial fibrillation: Secondary | ICD-10-CM

## 2022-04-19 DIAGNOSIS — R5381 Other malaise: Secondary | ICD-10-CM

## 2022-05-01 DIAGNOSIS — H9201 Otalgia, right ear: Secondary | ICD-10-CM | POA: Diagnosis not present

## 2022-05-01 DIAGNOSIS — H6121 Impacted cerumen, right ear: Secondary | ICD-10-CM | POA: Diagnosis not present

## 2022-05-05 DIAGNOSIS — L821 Other seborrheic keratosis: Secondary | ICD-10-CM | POA: Diagnosis not present

## 2022-05-05 DIAGNOSIS — D2272 Melanocytic nevi of left lower limb, including hip: Secondary | ICD-10-CM | POA: Diagnosis not present

## 2022-05-05 DIAGNOSIS — Z86018 Personal history of other benign neoplasm: Secondary | ICD-10-CM | POA: Diagnosis not present

## 2022-05-05 DIAGNOSIS — L820 Inflamed seborrheic keratosis: Secondary | ICD-10-CM | POA: Diagnosis not present

## 2022-05-05 DIAGNOSIS — Z808 Family history of malignant neoplasm of other organs or systems: Secondary | ICD-10-CM | POA: Diagnosis not present

## 2022-05-05 DIAGNOSIS — L578 Other skin changes due to chronic exposure to nonionizing radiation: Secondary | ICD-10-CM | POA: Diagnosis not present

## 2022-05-05 DIAGNOSIS — D225 Melanocytic nevi of trunk: Secondary | ICD-10-CM | POA: Diagnosis not present

## 2022-05-05 DIAGNOSIS — D2271 Melanocytic nevi of right lower limb, including hip: Secondary | ICD-10-CM | POA: Diagnosis not present

## 2022-05-05 DIAGNOSIS — L570 Actinic keratosis: Secondary | ICD-10-CM | POA: Diagnosis not present

## 2022-05-25 DIAGNOSIS — R052 Subacute cough: Secondary | ICD-10-CM | POA: Diagnosis not present

## 2022-05-25 DIAGNOSIS — R600 Localized edema: Secondary | ICD-10-CM | POA: Diagnosis not present

## 2022-05-25 DIAGNOSIS — H6122 Impacted cerumen, left ear: Secondary | ICD-10-CM | POA: Diagnosis not present

## 2022-06-01 DIAGNOSIS — E538 Deficiency of other specified B group vitamins: Secondary | ICD-10-CM | POA: Diagnosis not present

## 2022-07-14 DIAGNOSIS — M25552 Pain in left hip: Secondary | ICD-10-CM | POA: Diagnosis not present

## 2022-07-26 ENCOUNTER — Telehealth: Payer: Self-pay

## 2022-07-26 NOTE — Telephone Encounter (Signed)
Patient called and states she has been feeling weak and her watch states she's in A-Fib. Patient would like to know if she should go to Emergency Room.

## 2022-07-27 NOTE — Telephone Encounter (Signed)
She can come in for an EKG

## 2022-07-28 ENCOUNTER — Ambulatory Visit: Payer: Medicare Other | Admitting: Cardiology

## 2022-07-28 ENCOUNTER — Other Ambulatory Visit: Payer: Self-pay

## 2022-07-28 ENCOUNTER — Encounter: Payer: Self-pay | Admitting: Cardiology

## 2022-07-28 VITALS — BP 133/55 | HR 58 | Ht 60.0 in | Wt 165.0 lb

## 2022-07-28 DIAGNOSIS — I10 Essential (primary) hypertension: Secondary | ICD-10-CM

## 2022-07-28 DIAGNOSIS — R5381 Other malaise: Secondary | ICD-10-CM

## 2022-07-28 DIAGNOSIS — E7800 Pure hypercholesterolemia, unspecified: Secondary | ICD-10-CM

## 2022-07-28 DIAGNOSIS — I480 Paroxysmal atrial fibrillation: Secondary | ICD-10-CM | POA: Diagnosis not present

## 2022-07-28 DIAGNOSIS — R0609 Other forms of dyspnea: Secondary | ICD-10-CM | POA: Diagnosis not present

## 2022-07-28 DIAGNOSIS — R5383 Other fatigue: Secondary | ICD-10-CM | POA: Diagnosis not present

## 2022-07-28 MED ORDER — PRALUENT 75 MG/ML ~~LOC~~ SOAJ: 75.0000 mg | SUBCUTANEOUS | 3 refills | Status: DC

## 2022-07-28 NOTE — Progress Notes (Signed)
Primary Physician/Referring:  Fatima Sanger, FNP  Patient ID: Deborah Mcmillan, female    DOB: March 24, 1934, 88 y.o.   MRN: [id number]  Chief Complaint  Patient presents with   Atrial Fibrillation   Follow-up   HPI:    Deborah Mcmillan  is a 88 y.o. Caucasian female with carotid atheresclerosis and hypertension and hyperlipidemia, LDL in the range of 170-190, however she has not been able to tolerate any statins. She has bronchial asthma and multiple medication allergies and intolerances.  She also has paroxysmal atrial fibrillation documented in 2011, August 2022 and May 2023, she spontaneously converted to sinus rhythm.  She called our office stating that she is back in atrial fibrillation I am seeing her on an urgent basis.  She was diagnosed with COVID in September 2023 and since then has had mild fatigue, decreased exercise tolerance and skin changes and nail changes improved since being on vitamin supplements.  She is being seen on an urgent basis as she is feeling worsening fatigue, dyspnea and her smart watch saying she is in atrial fibrillation.  On review she has had episodes of A-fib with RVR that has been persistent for the past greater than 1 week.  Along with dyspnea, palpitations she continues to notice worsening fatigue.   Past Medical History:  Diagnosis Date   Arthritis    Asthma    Complication of anesthesia    difficult to wake up and agitation   Dyslipidemia    Dysrhythmia    h/o atrial couplets with short PAT, h/o a-fib during surgery (event monitor)   GERD (gastroesophageal reflux disease)    History of nuclear stress test 05/11/2009   dipyridamole; normal, low risk    Hypertension    Osteoporosis    Pneumonia    PONV (postoperative nausea and vomiting)    Postoperative atrial fibrillation (HCC) 01/11/2009   Seasonal allergies    Past Surgical History:  Procedure Laterality Date   APPENDECTOMY  1961   BLADDER SURGERY  2002   BREAST BIOPSY Left  05/09/2012   calcified and hyalinized fibroadenomatous and sclerosing adenosis   CATARACT EXTRACTION Bilateral 01/2022   01/2022 left, 02/2022 right   CHOLECYSTECTOMY N/A 01/01/2021   Procedure: LAPAROSCOPIC CHOLECYSTECTOMY;  Surgeon: Berna Bue, MD;  Location: [facility];  Service: General;  Laterality: N/A;   COSMETIC SURGERY  2011   1 episode of atrial fibrillation during this    HERNIA REPAIR  1999   hiatal   lump removal  05/06/2009   [facility]   NM MYOVIEW LTD  05/2009   No ischemia or infarction.   SHOULDER SURGERY Left 02/2013   Dr. Ranell Patrick - arthroscopy (rotator cuff ?debridement plus biceps tenodesis)   TRANSTHORACIC ECHOCARDIOGRAM  05/2009   EF=>55%, with normal LV systolic function, impaired LV relaxation; trace MR/TR; mild AV regurg   Family History  Problem Relation Age of Onset   Cancer Father    Cancer - Other Father    Cancer Brother    Cancer - Other Brother    Stroke Mother    Cancer - Other Sister    Cancer - Other Sister    Breast cancer Neg Hx     Social History   Tobacco Use   Smoking status: Never   Smokeless tobacco: Never  Substance Use Topics   Alcohol use: No   ROS  Review of Systems  Constitutional: Positive for malaise/fatigue.  Cardiovascular:  Positive for dyspnea on exertion and palpitations. Negative for  chest pain and leg swelling.  Gastrointestinal:  Negative for melena.   Objective  Blood pressure (!) 133/55, pulse (!) 58, height 5' (1.524 m), weight 165 lb (74.8 kg), SpO2 95%.     07/28/2022   11:20 AM 12/21/2021   10:49 AM 11/18/2021    3:00 PM  Vitals with BMI  Height 5\' 0"  5\' 0"    Weight 165 lbs 160 lbs 13 oz   BMI 32.22 31.4   Systolic 133 130 161  Diastolic 55 65 78  Pulse 58 73 64    Orthostatic VS for the past 72 hrs (Last 3 readings):  Patient Position BP Location Cuff Size  07/28/22 1120 Sitting Right Arm Normal      Physical Exam Neck:     Vascular: No carotid bruit or JVD.  Cardiovascular:     Rate and  Rhythm: Normal rate. Rhythm irregular.     Pulses: Intact distal pulses.     Heart sounds: Normal heart sounds. No murmur heard.    No gallop.  Pulmonary:     Effort: Pulmonary effort is normal.     Breath sounds: Normal breath sounds.  Abdominal:     General: Bowel sounds are normal.     Palpations: Abdomen is soft.  Musculoskeletal:     Right lower leg: No edema.     Left lower leg: No edema.    Laboratory examination:   Recent Labs    10/02/21 2145 11/18/21 1118  NA 128* 131*  K 4.4 4.6  CL 93* 97*  CO2 20* 23  GLUCOSE 89 105*  BUN 28* 23  CREATININE 1.05* 1.18*  CALCIUM 10.0 10.3  GFRNONAA 52* 45*       Latest Ref Rng & Units 11/18/2021   11:18 AM 10/02/2021    9:45 PM 06/08/2021   11:44 AM  CMP  Glucose 70 - 99 mg/dL 096  89  045   BUN 8 - 23 mg/dL 23  28  26    Creatinine 0.44 - 1.00 mg/dL 4.09  8.11  9.14   Sodium 135 - 145 mmol/L 131  128  137   Potassium 3.5 - 5.1 mmol/L 4.6  4.4  4.9   Chloride 98 - 111 mmol/L 97  93  103   CO2 22 - 32 mmol/L 23  20  24    Calcium 8.9 - 10.3 mg/dL 78.2  95.6  9.9       Latest Ref Rng & Units 11/18/2021   11:18 AM 10/02/2021    9:45 PM 06/08/2021   11:44 AM  CBC  WBC 4.0 - 10.5 K/uL 9.2  8.7  10.5   Hemoglobin 12.0 - 15.0 g/dL 21.3  08.6  57.8   Hematocrit 36.0 - 46.0 % 47.2  44.3  45.8   Platelets 150 - 400 K/uL 253  177  295     External labs:    TSH normal at 0.93.  Vitamin D 67.6.  Labs 04/30/2021:  Total cholesterol 163, triglycerides 86, HDL 71, LDL 75.  TSH normal at 1.54.  Vitamin D 26.7.  Medications and allergies   Allergies  Allergen Reactions   Calcium-Containing Compounds Other (See Comments)    Muscle cramping   Magnesium-Containing Compounds Itching and Rash   Tape Itching and Rash    Plastic, and "brown tape"   Levofloxacin Other (See Comments)   Nitrofurantoin Other (See Comments)    Unknown   Triamcinolone Other (See Comments)   Triamcinolone Acetonide Other (See Comments)  Welchol [Colesevelam Hcl] Other (See Comments)    Unknown   Cefuroxime Axetil Other (See Comments)    leg pain   Codeine Nausea Only    Pain & upset stomach   Crestor [Rosuvastatin] Other (See Comments)    Leg cramping   Lodine [Etodolac] Other (See Comments)    Upset stomach   Niacin And Related Rash    "on fire"   Penicillins Itching, Swelling and Rash    She reports some swelling and itchy.    Reclast [Zoledronic Acid] Other (See Comments)    Caused severe jaw pain   Sulfa Antibiotics Other (See Comments)    Pain & upset stomach   Tetracyclines & Related Rash    Upset stomach   Welchol [Colesevelam] Nausea Only and Other (See Comments)    Stomach did not feel good   Zetia [Ezetimibe] Other (See Comments)    Muscle pain     Current Outpatient Medications:    albuterol (VENTOLIN HFA) 108 (90 Base) MCG/ACT inhaler, Inhale 1 puff into the lungs every 4 (four) hours as needed for wheezing or shortness of breath., Disp: 1 each, Rfl: 11   Cholecalciferol (VITAMIN D) 50 MCG (2000 UT) CAPS, Take 2,000 Units by mouth daily., Disp: , Rfl:    Cyanocobalamin (B-12 COMPLIANCE INJECTION) 1000 MCG/ML KIT, Inject as directed every 30 (thirty) days., Disp: , Rfl:    denosumab (PROLIA) 60 MG/ML SOSY injection, Inject 60 mg into the skin every 6 (six) months., Disp: , Rfl:    diltiazem (CARDIZEM CD) 120 MG 24 hr capsule, TAKE 1 CAPSULE(120 MG) BY MOUTH DAILY, Disp: 90 capsule, Rfl: 1   fluticasone (FLONASE) 50 MCG/ACT nasal spray, Place 2 sprays into both nostrils daily as needed., Disp: , Rfl:    metoprolol succinate (TOPROL-XL) 100 MG 24 hr tablet, TAKE 1 TABLET(100 MG) BY MOUTH EVERY EVENING (Patient taking differently: 50 mg daily.), Disp: 90 tablet, Rfl: 1   Multiple Vitamin (MULTIVITAMIN WITH MINERALS) TABS tablet, Take 1 tablet by mouth daily., Disp: , Rfl:    omeprazole (PRILOSEC) 20 MG capsule, Take 20 mg by mouth in the morning., Disp: , Rfl:    rivaroxaban (XARELTO) 20 MG TABS tablet,  TAKE 1 TABLET(20 MG) BY MOUTH DAILY WITH SUPPER, Disp: 90 tablet, Rfl: 3   spironolactone (ALDACTONE) 50 MG tablet, TAKE 1 TABLET BY MOUTH EVERY MORNING, Disp: 90 tablet, Rfl: 1   vitamin B-12 (CYANOCOBALAMIN) 100 MCG tablet, Take 100 mcg by mouth daily., Disp: , Rfl:    Alirocumab (PRALUENT) 75 MG/ML SOAJ, Inject 1 mL (75 mg total) into the skin every 14 (fourteen) days., Disp: 6 mL, Rfl: 3    Radiology:   Chest x-ray 06/08/2021: Cardiac silhouette is normal in size. Small hiatal hernia. No mediastinal or hilar masses. No evidence of adenopathy.   Clear lungs.  No pleural effusion or pneumothorax.  Cardiac Studies:   Carotid artery duplex 2015/09/29: No hemodynamically significant arterial disease in the internal carotid artery bilaterally. Minimal mixed plaque noted. Antegrade vertebral artery flow.  Nuclear stress test   [12/11/2015]:  1. The resting electrocardiogram demonstrated normal sinus rhythm, normal resting conduction, no resting arrhythmias and normal rest repolarization. Stress EKG is non-diagnostic for ischemia as it a pharmacologic stress using Lexiscan. Stress symptoms included dyspnea. 2. Myocardial perfusion imaging is normal. Overall left ventricular systolic function was normal without regional wall motion abnormalities. The left ventricular ejection fraction was 58%.  PCV ECHOCARDIOGRAM COMPLETE 04/03/2020 Normal LV systolic function with visual EF 60-65%.  Left ventricle cavity is normal in size. Normal global wall motion. Indeterminate diastolic filling pattern, normal LAP. Left atrial cavity is mildly dilated. Mild (Grade I) aortic regurgitation. Mild (Grade I) mitral regurgitation. Mild tricuspid regurgitation. No evidence of pulmonary hypertension. RVSP measures 30 mmHg. Mild pulmonic regurgitation. Prior study dated 12/09/2015: LVEF 66%, normal diastolic function, mild LAE, mild to moderate AR, Mild to moderate MR, Mild TR, no PHTN.    EKG  EKG 01/27/2022:  Atrial fibrillation with controlled ventricular sponsor rate of 84 bpm, normal axis, no evidence of ischemia.  Compared to 12/21/2021, atrial fibrillation is new.  Assessment     ICD-10-CM   1. Paroxysmal atrial fibrillation (HCC)  I48.0 EKG 12-Lead    Magnesium    Basic metabolic panel    Pro b natriuretic peptide (BNP)    CBC    PCV ECHOCARDIOGRAM COMPLETE    2. Malaise and fatigue  R53.81    R53.83     3. Primary hypertension  I10     4. Hypercholesteremia  E78.00 Alirocumab (PRALUENT) 75 MG/ML SOAJ    Lipid Panel With LDL/HDL Ratio      CHA2DS2-VASc Score is 4.  Yearly risk of stroke: 4.8% (A, HTN, Female).  Score of 1=0.6; 2=2.2; 3=3.2; 4=4.8; 5=7.2; 6=9.8; 7=>9.8) -(CHF; HTN; vasc disease DM,  Female = 1; Age <65 =0; 65-74 = 1,  >75 =2; stroke/embolism= 2).    Meds ordered this encounter  Medications   Alirocumab (PRALUENT) 75 MG/ML SOAJ    Sig: Inject 1 mL (75 mg total) into the skin every 14 (fourteen) days.    Dispense:  6 mL    Refill:  3    Medications Discontinued During This Encounter  Medication Reason   PRALUENT 75 MG/ML SOAJ Side effect (s)    Orders Placed This Encounter  Procedures   Magnesium   Basic metabolic panel   Pro b natriuretic peptide (BNP)   CBC   Lipid Panel With LDL/HDL Ratio   EKG 12-Lead   PCV ECHOCARDIOGRAM COMPLETE    Standing Status:   Future    Standing Expiration Date:   07/28/2023    Scheduling Instructions:     To be done post cardioversion      Recommendations:   Deborah Mcmillan  is a 88 y.o. Caucasian female with carotid atheresclerosis and hypertension and hyperlipidemia, LDL in the range of 170-190, however she has not been able to tolerate any statins. She has bronchial asthma and multiple medication allergies and intolerances.  She also has paroxysmal atrial fibrillation documented in 2011, August 2022 and May 2023, she spontaneously converted to sinus rhythm.  She called our office stating that she is back in atrial  fibrillation I am seeing her on an urgent basis.  She was diagnosed with COVID in September 2023 and since then has had mild fatigue, decreased exercise tolerance and skin changes and nail changes improved since being on vitamin supplements.  1. Paroxysmal atrial fibrillation (HCC) With symptomatic recurrence of atrial fibrillation and has been in A-fib for the past 6 days according to the tracings that I reviewed on her Apple Watch.  I do not think she will convert back to sinus rhythm spontaneously on her own.  Essentially she has been having 1 episode of atrial fibrillation since 2022 needing therapy with a medication change.  As she has got persistent atrial fibrillation, we will schedule for direct-current cardioversion.  Schedule for Direct current cardioversion. I have discussed regarding risks benefits rate  control vs rhythm control with the patient. Patient understands cardiac arrest and need for CPR, aspiration pneumonia, but not limited to these. Patient is willing.   - EKG 12-Lead  2. Malaise and fatigue Patient probably has a component of long COVID but symptoms of gotten worse for the past 4 to 5 days since she has been in A-fib.  In view of fatigue, dyspnea, will repeat echocardiogram to reevaluate her LV function as it has been greater than 2 years ago that she had an echo.  3. Primary hypertension Blood pressure is controlled on present medical regimen, continue same.  4. Hypercholesteremia In view of carotid atherosclerosis although no significant stenosis, she was started on Praluent which she has held for the past several months to see if her symptoms of fatigue would improve.  There is mild inflammation and fatigue but overall no change.  She is willing to restart Praluent and see how she would react again, will obtain baseline lipid profile testing today.  Office visit in 6 weeks to follow-up on atrial fibrillation.  Other orders - Cyanocobalamin (B-12 COMPLIANCE INJECTION)  1000 MCG/ML KIT; Inject as directed every 30 (thirty) days. - vitamin B-12 (CYANOCOBALAMIN) 100 MCG tablet; Take 100 mcg by mouth daily.  This was a 40-minute office visit encounter and discussions regarding cardioversion, evaluation of multiple medical issues and management of medications.      Yates Decamp, MD, [facility] 07/28/2022, 12:10 PM Office: [phone] Pager: [phone]

## 2022-07-29 DIAGNOSIS — E538 Deficiency of other specified B group vitamins: Secondary | ICD-10-CM | POA: Diagnosis not present

## 2022-07-29 LAB — BASIC METABOLIC PANEL
BUN/Creatinine Ratio: 21 (ref 12–28)
BUN: 23 mg/dL (ref 8–27)
CO2: 19 mmol/L — ABNORMAL LOW (ref 20–29)
Calcium: 9.5 mg/dL (ref 8.7–10.3)
Chloride: 101 mmol/L (ref 96–106)
Creatinine, Ser: 1.07 mg/dL — ABNORMAL HIGH (ref 0.57–1.00)
Glucose: 111 mg/dL — ABNORMAL HIGH (ref 70–99)
Potassium: 4.7 mmol/L (ref 3.5–5.2)
Sodium: 138 mmol/L (ref 134–144)
eGFR: 51 mL/min/{1.73_m2} — ABNORMAL LOW (ref >59)

## 2022-07-29 LAB — LIPID PANEL WITH LDL/HDL RATIO
Cholesterol, Total: 265 mg/dL — ABNORMAL HIGH (ref 100–199)
HDL: 68 mg/dL (ref >39)
LDL Chol Calc (NIH): 163 mg/dL — ABNORMAL HIGH (ref 0–99)
LDL/HDL Ratio: 2.4 ratio (ref 0.0–3.2)
Triglycerides: 187 mg/dL — ABNORMAL HIGH (ref 0–149)
VLDL Cholesterol Cal: 34 mg/dL (ref 5–40)

## 2022-07-29 LAB — MAGNESIUM: Magnesium: 2.3 mg/dL (ref 1.6–2.3)

## 2022-07-29 LAB — PRO B NATRIURETIC PEPTIDE: NT-Pro BNP: 1438 pg/mL — ABNORMAL HIGH (ref 0–738)

## 2022-07-29 LAB — CBC
Hematocrit: 43.2 % (ref 34.0–46.6)
Hemoglobin: 14.8 g/dL (ref 11.1–15.9)
MCH: 30.5 pg (ref 26.6–33.0)
MCHC: 34.3 g/dL (ref 31.5–35.7)
MCV: 89 fL (ref 79–97)
Platelets: 245 10*3/uL (ref 150–450)
RBC: 4.85 x10E6/uL (ref 3.77–5.28)
RDW: 12.8 % (ref 11.7–15.4)
WBC: 12 10*3/uL — ABNORMAL HIGH (ref 3.4–10.8)

## 2022-08-05 NOTE — Progress Notes (Signed)
Spoke with patient, states she missed a couple of doses of Xarelto because she was sick and forgot...Marland KitchenMarland KitchenMarland Kitchen Dr. Jacinto Halim notified, he informed this RN to notify pt to call office and reschedule CV for 1 week and continue taking Xarelto, pt informed and verbal acknowledgement noted

## 2022-08-06 ENCOUNTER — Ambulatory Visit (HOSPITAL_COMMUNITY): Admission: RE | Admit: 2022-08-06 | Payer: Medicare Other | Source: Home / Self Care | Admitting: Cardiology

## 2022-08-06 ENCOUNTER — Encounter (HOSPITAL_COMMUNITY): Admission: RE | Payer: Self-pay | Source: Home / Self Care

## 2022-08-06 DIAGNOSIS — I480 Paroxysmal atrial fibrillation: Secondary | ICD-10-CM

## 2022-08-06 SURGERY — CARDIOVERSION
Anesthesia: General

## 2022-08-12 ENCOUNTER — Telehealth: Payer: Self-pay

## 2022-08-12 NOTE — Telephone Encounter (Signed)
Can come in for an EKG

## 2022-08-12 NOTE — Telephone Encounter (Signed)
Patient states she has been in sinus rhythm since yesterday afternoon. Patient wants to know if she still have to go through with cardioversion tomorrow. Pt states she hasn't  been in Afib since yesterday and she feels better.

## 2022-08-13 ENCOUNTER — Ambulatory Visit (HOSPITAL_COMMUNITY): Admission: RE | Admit: 2022-08-13 | Payer: Medicare Other | Source: Home / Self Care | Admitting: Cardiology

## 2022-08-13 ENCOUNTER — Encounter (HOSPITAL_COMMUNITY): Admission: RE | Payer: Self-pay | Source: Home / Self Care

## 2022-08-13 ENCOUNTER — Ambulatory Visit: Payer: Medicare Other | Admitting: Cardiology

## 2022-08-13 DIAGNOSIS — I480 Paroxysmal atrial fibrillation: Secondary | ICD-10-CM | POA: Diagnosis not present

## 2022-08-13 SURGERY — CARDIOVERSION
Anesthesia: General

## 2022-08-13 NOTE — Progress Notes (Signed)
ICD-10-CM   1. Paroxysmal atrial fibrillation (HCC)  I48.0 EKG 12-Lead     EKG 08/13/2022: Normal sinus rhythm with rate of 65 bpm, normal EKG.  Compared to 07/28/2022, atrial fibrillation not present.  Patient with scheduled for direct-current cardioversion however she has spontaneously converted to sinus rhythm.  Continue anticoagulation for now.  Keep follow-up appointments.

## 2022-08-17 ENCOUNTER — Ambulatory Visit: Payer: Medicare Other

## 2022-08-17 DIAGNOSIS — I480 Paroxysmal atrial fibrillation: Secondary | ICD-10-CM | POA: Diagnosis not present

## 2022-08-19 ENCOUNTER — Telehealth: Payer: Self-pay

## 2022-08-19 NOTE — Telephone Encounter (Signed)
Patient as several questions regarding her EKG, Echocardiogram results and also has several questions. She is asking that you give her a call when you get back. Non- urgent.

## 2022-08-20 NOTE — Telephone Encounter (Signed)
All good and nothing alarming. She will keep the appointment or mychart message me. Normal EKG and she will have occasional A. Fib tht is why she is on blood thinners

## 2022-08-20 NOTE — Telephone Encounter (Signed)
Called patient, I have addressed her concerns. Patient will wait for her appointment on 09/14/22 to ask any questions that she may have, then.

## 2022-08-22 NOTE — Progress Notes (Signed)
Echocardiogram 08/17/2022: EKG: Sinus Rhythm.  Normal LV systolic function with visual EF 60-65%. Left ventricle cavity is normal in size. Normal left ventricular wall thickness. Normal global wall motion. Normal diastolic filling pattern, normal LAP. Mild (Grade I) aortic regurgitation. Mild (Grade I) mitral regurgitation. Mild tricuspid regurgitation. No evidence of pulmonary hypertension. RVSP measures 30 mmHg. Mild pulmonic regurgitation. Compared to 04/03/2020 no significant change.

## 2022-09-02 DIAGNOSIS — E538 Deficiency of other specified B group vitamins: Secondary | ICD-10-CM | POA: Diagnosis not present

## 2022-09-14 ENCOUNTER — Ambulatory Visit: Payer: Medicare Other | Admitting: Cardiology

## 2022-09-14 ENCOUNTER — Encounter: Payer: Self-pay | Admitting: Cardiology

## 2022-09-14 ENCOUNTER — Other Ambulatory Visit: Payer: Self-pay | Admitting: Cardiology

## 2022-09-14 VITALS — BP 120/60 | HR 71 | Resp 16 | Ht 60.0 in | Wt 162.0 lb

## 2022-09-14 DIAGNOSIS — E7800 Pure hypercholesterolemia, unspecified: Secondary | ICD-10-CM | POA: Diagnosis not present

## 2022-09-14 DIAGNOSIS — I10 Essential (primary) hypertension: Secondary | ICD-10-CM

## 2022-09-14 DIAGNOSIS — I480 Paroxysmal atrial fibrillation: Secondary | ICD-10-CM | POA: Diagnosis not present

## 2022-09-14 MED ORDER — EVOLOCUMAB 140 MG/ML ~~LOC~~ SOAJ
140.0000 mg | Freq: Once | SUBCUTANEOUS | Status: AC
Start: 2022-09-14 — End: 2022-09-14
  Administered 2022-09-14: 140 mg via SUBCUTANEOUS

## 2022-09-14 MED ORDER — DILTIAZEM HCL ER COATED BEADS 120 MG PO CP24
120.0000 mg | ORAL_CAPSULE | Freq: Every day | ORAL | 3 refills | Status: DC
  Filled 2023-07-13: qty 90, 90d supply, fill #0

## 2022-09-14 MED ORDER — EVOLOCUMAB 140 MG/ML ~~LOC~~ SOAJ
140.0000 mg | Freq: Once | SUBCUTANEOUS | Status: DC
Start: 2022-09-14 — End: 2022-09-14

## 2022-09-14 NOTE — Progress Notes (Signed)
Primary Physician/Referring:  Fatima Sanger, FNP  Patient ID: Deborah Mcmillan, female    DOB: 1934/06/05, 88 y.o.   MRN: [id number]  Chief Complaint  Patient presents with   Atrial Fibrillation   Follow-up    1 year   Hypertension   Hyperlipidemia   HPI:    Deborah Mcmillan  is a 88 y.o. Caucasian female with carotid atherosclerosis, hypertension and hyperlipidemia, LDL in the range of 170-190, myopathy, reactive airway disease and multiple medication intolerances, paroxysmal atrial fibrillation presents for 86-month office visit.  She was in A-fib with RVR when she presented with marked fatigue and dyspnea on 07/28/2022 but spontaneously converted to sinus rhythm in a few days after starting diltiazem.  She is asymptomatic. She discontinued Praluent 75 mg due to onset of marked fatigue. Underwent labs and presents for follow up.   Past Medical History:  Diagnosis Date   Arthritis    Asthma    Complication of anesthesia    difficult to wake up and agitation   Dyslipidemia    Dysrhythmia    h/o atrial couplets with short PAT, h/o a-fib during surgery (event monitor)   GERD (gastroesophageal reflux disease)    History of nuclear stress test 05/11/2009   dipyridamole; normal, low risk    Hypertension    Osteoporosis    Pneumonia    PONV (postoperative nausea and vomiting)    Postoperative atrial fibrillation (HCC) 01/11/2009   Seasonal allergies    Past Surgical History:  Procedure Laterality Date   APPENDECTOMY  1961   BLADDER SURGERY  2002   BREAST BIOPSY Left 05/09/2012   calcified and hyalinized fibroadenomatous and sclerosing adenosis   CATARACT EXTRACTION Bilateral 01/2022   01/2022 left, 02/2022 right   CHOLECYSTECTOMY N/A 01/01/2021   Procedure: LAPAROSCOPIC CHOLECYSTECTOMY;  Surgeon: Berna Bue, MD;  Location: [facility];  Service: General;  Laterality: N/A;   COSMETIC SURGERY  2011   1 episode of atrial fibrillation during this    HERNIA REPAIR  1999    hiatal   lump removal  05/06/2009   [facility]   NM MYOVIEW LTD  05/2009   No ischemia or infarction.   SHOULDER SURGERY Left 02/2013   Dr. Ranell Patrick - arthroscopy (rotator cuff ?debridement plus biceps tenodesis)   TRANSTHORACIC ECHOCARDIOGRAM  05/2009   EF=>55%, with normal LV systolic function, impaired LV relaxation; trace MR/TR; mild AV regurg   Family History  Problem Relation Age of Onset   Cancer Father    Cancer - Other Father    Cancer Brother    Cancer - Other Brother    Stroke Mother    Cancer - Other Sister    Cancer - Other Sister    Breast cancer Neg Hx     Social History   Tobacco Use   Smoking status: Never   Smokeless tobacco: Never  Substance Use Topics   Alcohol use: No   ROS  Review of Systems  Constitutional: Positive for malaise/fatigue.  Cardiovascular:  Positive for dyspnea on exertion and palpitations. Negative for chest pain and leg swelling.  Gastrointestinal:  Negative for melena.   Objective  Blood pressure 120/60, pulse 71, resp. rate 16, height 5' (1.524 m), weight 162 lb (73.5 kg).     09/14/2022   11:43 AM 09/14/2022   11:20 AM 07/28/2022   11:20 AM  Vitals with BMI  Height  5\' 0"  5\' 0"   Weight  162 lbs 165 lbs  BMI  31.64 32.22  Systolic 120 145 952  Diastolic 60 65 55  Pulse  71 58    Orthostatic VS for the past 72 hrs (Last 3 readings):  Patient Position BP Location Cuff Size  09/14/22 1120 Sitting Left Arm Large      Physical Exam Neck:     Vascular: No carotid bruit or JVD.  Cardiovascular:     Rate and Rhythm: Normal rate. Rhythm irregular.     Pulses: Intact distal pulses.     Heart sounds: Normal heart sounds. No murmur heard.    No gallop.  Pulmonary:     Effort: Pulmonary effort is normal.     Breath sounds: Normal breath sounds.  Abdominal:     General: Bowel sounds are normal.     Palpations: Abdomen is soft.  Musculoskeletal:     Right lower leg: No edema.     Left lower leg: No edema.    Laboratory  examination:   Recent Labs    10/02/21 2145 11/18/21 1118 07/28/22 1313  NA 128* 131* 138  K 4.4 4.6 4.7  CL 93* 97* 101  CO2 20* 23 19*  GLUCOSE 89 105* 111*  BUN 28* 23 23  CREATININE 1.05* 1.18* 1.07*  CALCIUM 10.0 10.3 9.5  GFRNONAA 52* 45*  --        Latest Ref Rng & Units 07/28/2022    1:13 PM 11/18/2021   11:18 AM 10/02/2021    9:45 PM  CMP  Glucose 70 - 99 mg/dL 841  324  89   BUN 8 - 27 mg/dL 23  23  28    Creatinine 0.57 - 1.00 mg/dL 4.01  0.27  2.53   Sodium 134 - 144 mmol/L 138  131  128   Potassium 3.5 - 5.2 mmol/L 4.7  4.6  4.4   Chloride 96 - 106 mmol/L 101  97  93   CO2 20 - 29 mmol/L 19  23  20    Calcium 8.7 - 10.3 mg/dL 9.5  66.4  40.3       Latest Ref Rng & Units 07/28/2022    1:13 PM 11/18/2021   11:18 AM 10/02/2021    9:45 PM  CBC  WBC 3.4 - 10.8 x10E3/uL 12.0  9.2  8.7   Hemoglobin 11.1 - 15.9 g/dL 47.4  25.9  56.3   Hematocrit 34.0 - 46.6 % 43.2  47.2  44.3   Platelets 150 - 450 x10E3/uL 245  253  177    Lab Results  Component Value Date   CHOL 265 (H) 07/28/2022   HDL 68 07/28/2022   LDLCALC 163 (H) 07/28/2022   TRIG 187 (H) 07/28/2022    External labs:    TSH normal at 0.93.  Vitamin D 67.6.  Labs 04/30/2021:  Total cholesterol 163, triglycerides 86, HDL 71, LDL 75.  TSH normal at 1.54.  Vitamin D 26.7.  Medications and allergies   Allergies  Allergen Reactions   Calcium-Containing Compounds Other (See Comments)    Muscle cramping   Magnesium-Containing Compounds Itching and Rash   Tape Itching and Rash    Plastic, and "brown tape"   Alirocumab     Other Reaction(s): Other (See Comments)  Generalized weakness   Levofloxacin Other (See Comments)   Nitrofurantoin Other (See Comments)    Unknown   Triamcinolone Other (See Comments)   Triamcinolone Acetonide Other (See Comments)   Welchol [Colesevelam Hcl] Other (See Comments)    Unknown   Cefuroxime Axetil Other (See Comments)  leg pain   Codeine Nausea Only    Pain  & upset stomach   Crestor [Rosuvastatin] Other (See Comments)    Leg cramping   Lodine [Etodolac] Other (See Comments)    Upset stomach   Niacin And Related Rash    "on fire"   Penicillins Itching, Swelling and Rash    She reports some swelling and itchy.    Reclast [Zoledronic Acid] Other (See Comments)    Caused severe jaw pain   Sulfa Antibiotics Other (See Comments) and Nausea Only    Pain & upset stomach  Other Reaction(s): GI Intolerance   Tetracyclines & Related Rash    Upset stomach   Welchol [Colesevelam] Nausea Only and Other (See Comments)    Stomach did not feel good   Zetia [Ezetimibe] Other (See Comments)    Muscle pain     Current Outpatient Medications:    albuterol (VENTOLIN HFA) 108 (90 Base) MCG/ACT inhaler, Inhale 1 puff into the lungs every 4 (four) hours as needed for wheezing or shortness of breath., Disp: 1 each, Rfl: 11   Cyanocobalamin (B-12 COMPLIANCE INJECTION) 1000 MCG/ML KIT, Inject as directed every 30 (thirty) days., Disp: , Rfl:    Cyanocobalamin (VITAMIN B-12 PO), Take 3,000 mcg by mouth daily., Disp: , Rfl:    denosumab (PROLIA) 60 MG/ML SOSY injection, Inject 60 mg into the skin every 6 (six) months., Disp: , Rfl:    fluticasone (FLONASE) 50 MCG/ACT nasal spray, Place 2 sprays into both nostrils daily as needed., Disp: , Rfl:    magnesium oxide (MAG-OX) 400 (240 Mg) MG tablet, Take 400 mg by mouth daily., Disp: , Rfl:    metoprolol succinate (TOPROL-XL) 100 MG 24 hr tablet, TAKE 1 TABLET(100 MG) BY MOUTH EVERY EVENING (Patient taking differently: Take 50 mg by mouth at bedtime.), Disp: 90 tablet, Rfl: 1   Multiple Vitamin (MULTIVITAMIN WITH MINERALS) TABS tablet, Take 1 tablet by mouth daily., Disp: , Rfl:    omeprazole (PRILOSEC) 20 MG capsule, Take 20 mg by mouth in the morning., Disp: , Rfl:    rivaroxaban (XARELTO) 20 MG TABS tablet, TAKE 1 TABLET(20 MG) BY MOUTH DAILY WITH SUPPER, Disp: 90 tablet, Rfl: 3   spironolactone (ALDACTONE) 50 MG  tablet, TAKE 1 TABLET BY MOUTH EVERY MORNING, Disp: 90 tablet, Rfl: 1   diltiazem (CARDIZEM CD) 120 MG 24 hr capsule, Take 1 capsule (120 mg total) by mouth daily., Disp: 90 capsule, Rfl: 3    Radiology:   Chest x-ray 06/08/2021: Cardiac silhouette is normal in size. Small hiatal hernia. No mediastinal or hilar masses. No evidence of adenopathy.   Clear lungs.  No pleural effusion or pneumothorax.  Cardiac Studies:   Carotid artery duplex 10-10-15: No hemodynamically significant arterial disease in the internal carotid artery bilaterally. Minimal mixed plaque noted. Antegrade vertebral artery flow.  Nuclear stress test   [12/11/2015]:  1. The resting electrocardiogram demonstrated normal sinus rhythm, normal resting conduction, no resting arrhythmias and normal rest repolarization. Stress EKG is non-diagnostic for ischemia as it a pharmacologic stress using Lexiscan. Stress symptoms included dyspnea. 2. Myocardial perfusion imaging is normal. Overall left ventricular systolic function was normal without regional wall motion abnormalities. The left ventricular ejection fraction was 58%.  PCV ECHOCARDIOGRAM COMPLETE 08/17/2022  Narrative Echocardiogram 08/17/2022: EKG: Sinus Rhythm. Normal LV systolic function with visual EF 60-65%. Left ventricle cavity is normal in size. Normal left ventricular wall thickness. Normal global wall motion. Normal diastolic filling pattern, normal LAP. Mild (Grade I)  aortic regurgitation. Mild (Grade I) mitral regurgitation. Mild tricuspid regurgitation. No evidence of pulmonary hypertension. RVSP measures 30 mmHg. Mild pulmonic regurgitation. Compared to 04/03/2020 no significant change.     EKG  EKG 09/14/2022: Normal sinus rhythm at rate of 63 bpm, normal axis, no evidence of ischemia, normal EKG.  Compared to 08/13/2022, no significant change.  EKG 07/28/2022: Atrial fibrillation with controlled ventricular sponsor rate of 84 bpm, normal axis, no  evidence of ischemia.  Compared to 12/21/2021, atrial fibrillation is new.  Assessment     ICD-10-CM   1. Paroxysmal atrial fibrillation (HCC)  I48.0 EKG 12-Lead    diltiazem (CARDIZEM CD) 120 MG 24 hr capsule    2. Primary hypertension  I10     3. Hypercholesteremia  E78.00 Evolocumab SOAJ 140 mg    DISCONTINUED: Evolocumab SOAJ 140 mg      CHA2DS2-VASc Score is 4.  Yearly risk of stroke: 4.8% (A, HTN, Female).  Score of 1=0.6; 2=2.2; 3=3.2; 4=4.8; 5=7.2; 6=9.8; 7=>9.8) -(CHF; HTN; vasc disease DM,  Female = 1; Age <65 =0; 65-74 = 1,  >75 =2; stroke/embolism= 2).    Meds ordered this encounter  Medications   DISCONTD: Evolocumab SOAJ 140 mg   diltiazem (CARDIZEM CD) 120 MG 24 hr capsule    Sig: Take 1 capsule (120 mg total) by mouth daily.    Dispense:  90 capsule    Refill:  3   Evolocumab SOAJ 140 mg    Medications Discontinued During This Encounter  Medication Reason   Alirocumab (PRALUENT) 75 MG/ML SOAJ Side effect (s)   diltiazem (CARDIZEM CD) 120 MG 24 hr capsule Reorder   Evolocumab SOAJ 140 mg    Administrations This Visit     Evolocumab SOAJ 140 mg     Admin Date 09/14/2022 Action Given Dose 140 mg Route Subcutaneous Documented By Erby Pian, CMA             Orders Placed This Encounter  Procedures   EKG 12-Lead      Recommendations:   Deborah Mcmillan  is a 88 y.o. Caucasian female with carotid atherosclerosis, hypertension and hyperlipidemia, LDL in the range of 170-190, myopathy, reactive airway disease and multiple medication intolerances, paroxysmal atrial fibrillation presents for 35-month office visit.  She was in A-fib with RVR when she presented with marked fatigue and dyspnea on 07/28/2022 but spontaneously converted to sinus rhythm in a few days after starting diltiazem.   1. Paroxysmal atrial fibrillation (HCC) Patient with PAF, presently doing well and has maintained sinus rhythm.  She remains asymptomatic.  Continue diltiazem along  with metoprolol succinate at high dose.  Although 88 years of age, very young looking and active.  - EKG 12-Lead - diltiazem (CARDIZEM CD) 120 MG 24 hr capsule; Take 1 capsule (120 mg total) by mouth daily.  Dispense: 90 capsule; Refill: 3  2. Primary hypertension Blood pressure under excellent control.  Presently on metoprolol succinate and diltiazem plus spironolactone.  Renal function has remained stable with mild stage IIIa chronic kidney disease, potassium levels are normal.  3. Hypercholesteremia Patient has statin myopathy, hence has been on Praluent in view of carotid atherosclerosis.  However developed marked generalized weakness and fatigue and hence had to be discontinued.  Her LDL has again risen and she would like to try Repatha but prior to ordering the medication, she would like to try samples.  We went ahead and gave her a subcu dose of Repatha today and she will let us  know in few days if she is tolerating this. If she indeed does tolerate this, I will send in an Rx.  - Evolocumab SOAJ 140 mg.    Yates Decamp, MD, [facility] 09/14/2022, 7:06 PM Office: [phone] Pager: [phone]

## 2022-09-14 NOTE — Telephone Encounter (Signed)
Patient needs refill

## 2022-09-21 DIAGNOSIS — K111 Hypertrophy of salivary gland: Secondary | ICD-10-CM | POA: Diagnosis not present

## 2022-09-21 DIAGNOSIS — H9113 Presbycusis, bilateral: Secondary | ICD-10-CM | POA: Diagnosis not present

## 2022-09-23 ENCOUNTER — Other Ambulatory Visit: Payer: Self-pay

## 2022-09-23 NOTE — Telephone Encounter (Signed)
Patient called requesting a 90 day supply be sent in. She

## 2022-10-05 ENCOUNTER — Other Ambulatory Visit: Payer: Self-pay | Admitting: Cardiology

## 2022-10-05 DIAGNOSIS — E538 Deficiency of other specified B group vitamins: Secondary | ICD-10-CM | POA: Diagnosis not present

## 2022-10-12 DIAGNOSIS — E538 Deficiency of other specified B group vitamins: Secondary | ICD-10-CM | POA: Diagnosis not present

## 2022-10-12 DIAGNOSIS — E7800 Pure hypercholesterolemia, unspecified: Secondary | ICD-10-CM | POA: Diagnosis not present

## 2022-10-13 LAB — LAB REPORT - SCANNED: EGFR: 49

## 2022-10-19 DIAGNOSIS — M810 Age-related osteoporosis without current pathological fracture: Secondary | ICD-10-CM | POA: Diagnosis not present

## 2022-10-19 DIAGNOSIS — E538 Deficiency of other specified B group vitamins: Secondary | ICD-10-CM | POA: Diagnosis not present

## 2022-10-19 DIAGNOSIS — Z23 Encounter for immunization: Secondary | ICD-10-CM | POA: Diagnosis not present

## 2022-10-19 DIAGNOSIS — E559 Vitamin D deficiency, unspecified: Secondary | ICD-10-CM | POA: Diagnosis not present

## 2022-10-19 DIAGNOSIS — I10 Essential (primary) hypertension: Secondary | ICD-10-CM | POA: Diagnosis not present

## 2022-10-19 DIAGNOSIS — I480 Paroxysmal atrial fibrillation: Secondary | ICD-10-CM | POA: Diagnosis not present

## 2022-10-19 DIAGNOSIS — R399 Unspecified symptoms and signs involving the genitourinary system: Secondary | ICD-10-CM | POA: Diagnosis not present

## 2022-10-20 NOTE — Progress Notes (Signed)
Labs done 10/12/2022:  Serum glucose 98 mg, BUN 18, creatinine 1.09, EGFR 49 mL, sodium 138, potassium 4.8, LFTs normal.  Total cholesterol 192, triglycerides 132, HDL 65, LDL 104.  Vitamin D 36.1.  Hb 13.9/HCT 42.5, platelets 226.  Normal indices.

## 2022-11-08 DIAGNOSIS — E538 Deficiency of other specified B group vitamins: Secondary | ICD-10-CM | POA: Diagnosis not present

## 2022-12-21 DIAGNOSIS — E538 Deficiency of other specified B group vitamins: Secondary | ICD-10-CM | POA: Diagnosis not present

## 2022-12-22 ENCOUNTER — Ambulatory Visit: Payer: BLUE CROSS/BLUE SHIELD | Admitting: Cardiology

## 2023-01-13 ENCOUNTER — Other Ambulatory Visit: Payer: Self-pay | Admitting: Cardiology

## 2023-01-13 DIAGNOSIS — I480 Paroxysmal atrial fibrillation: Secondary | ICD-10-CM

## 2023-01-13 NOTE — Telephone Encounter (Signed)
 Prescription refill request for Xarelto  received.  Indication: PAF Last office visit: 09/14/22 JINNY Bergamo MD Weight: 73.5kg Age: 89 Scr: 1.09 on 10/12/22  Epic CrCl: 42.99  Based on above findings Xarelto  15mg  daily is the appropriate dose.  Pt is taking 20mg  daily.  Message sent to PharmD for dose adjustment.

## 2023-01-13 NOTE — Telephone Encounter (Signed)
 Yes, please reduce to 15mg  once daily

## 2023-01-13 NOTE — Telephone Encounter (Signed)
 Pt called and notified of decrease in dose due to CrCl.  New Rx sent to [facility].

## 2023-01-20 ENCOUNTER — Telehealth: Payer: Self-pay | Admitting: Cardiology

## 2023-01-20 NOTE — Telephone Encounter (Signed)
 Pt called labs she had drawn last July. She states her insurance won't cover because "it was not needed" she asked if the "codes can changed" so she doesn't have to pay this bill. Please advise.

## 2023-01-21 NOTE — Telephone Encounter (Signed)
 Referred this message to our staff that worked with Dr. Jacinto Halim, as the labs in question are prior to him joining [facility]. She will be calling the patient.

## 2023-02-07 DIAGNOSIS — M810 Age-related osteoporosis without current pathological fracture: Secondary | ICD-10-CM | POA: Diagnosis not present

## 2023-02-07 DIAGNOSIS — E538 Deficiency of other specified B group vitamins: Secondary | ICD-10-CM | POA: Diagnosis not present

## 2023-02-12 IMAGING — DX DG CHEST 2V
2 series · 2 of 2 positions shown · non-contrast
Comparison: Chest x-ray 12/25/2017.

CLINICAL DATA: 86-year-old female with history of persistent
shortness of breath following COVID infection.

EXAM:
CHEST - 2 VIEW

[chest pa]
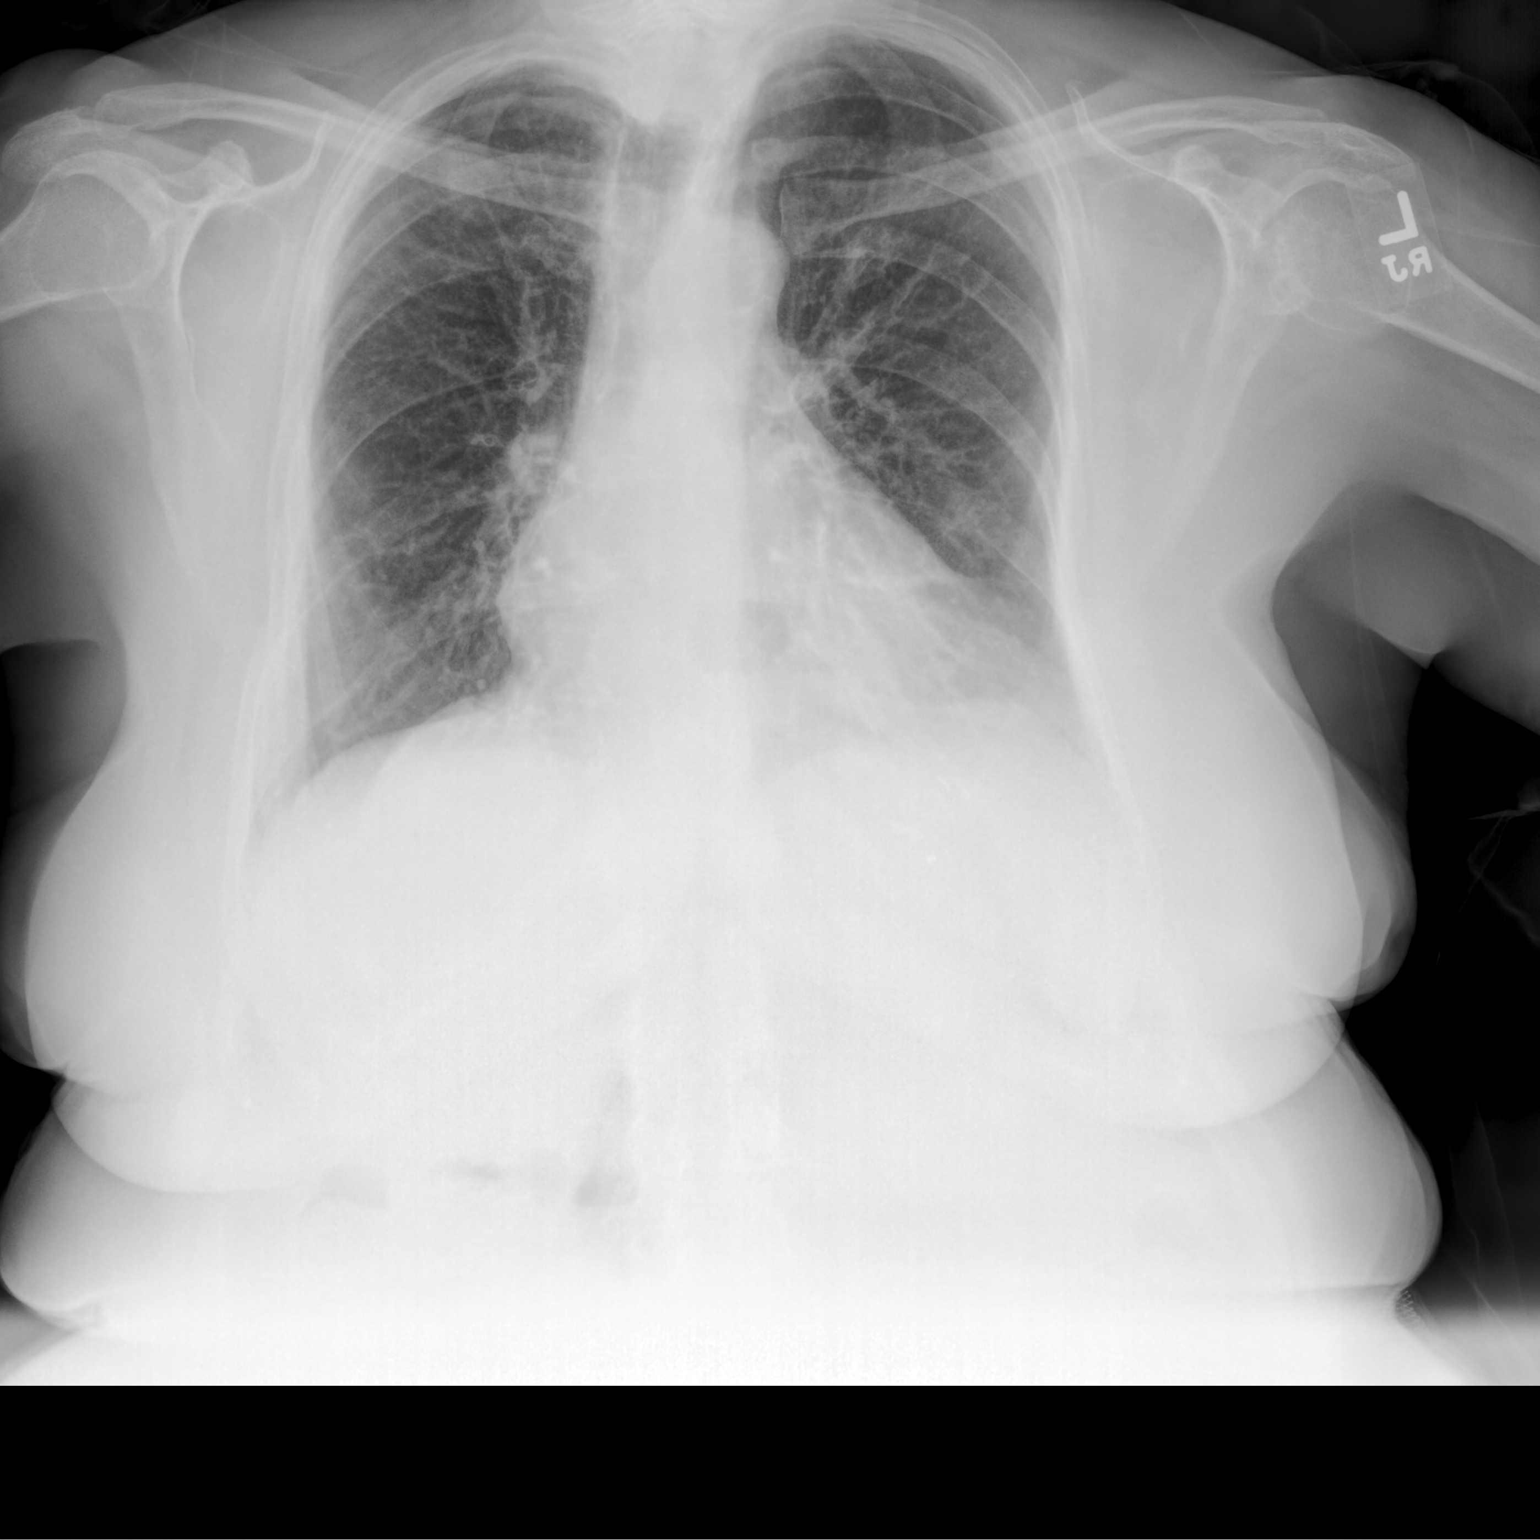

[chest lat]
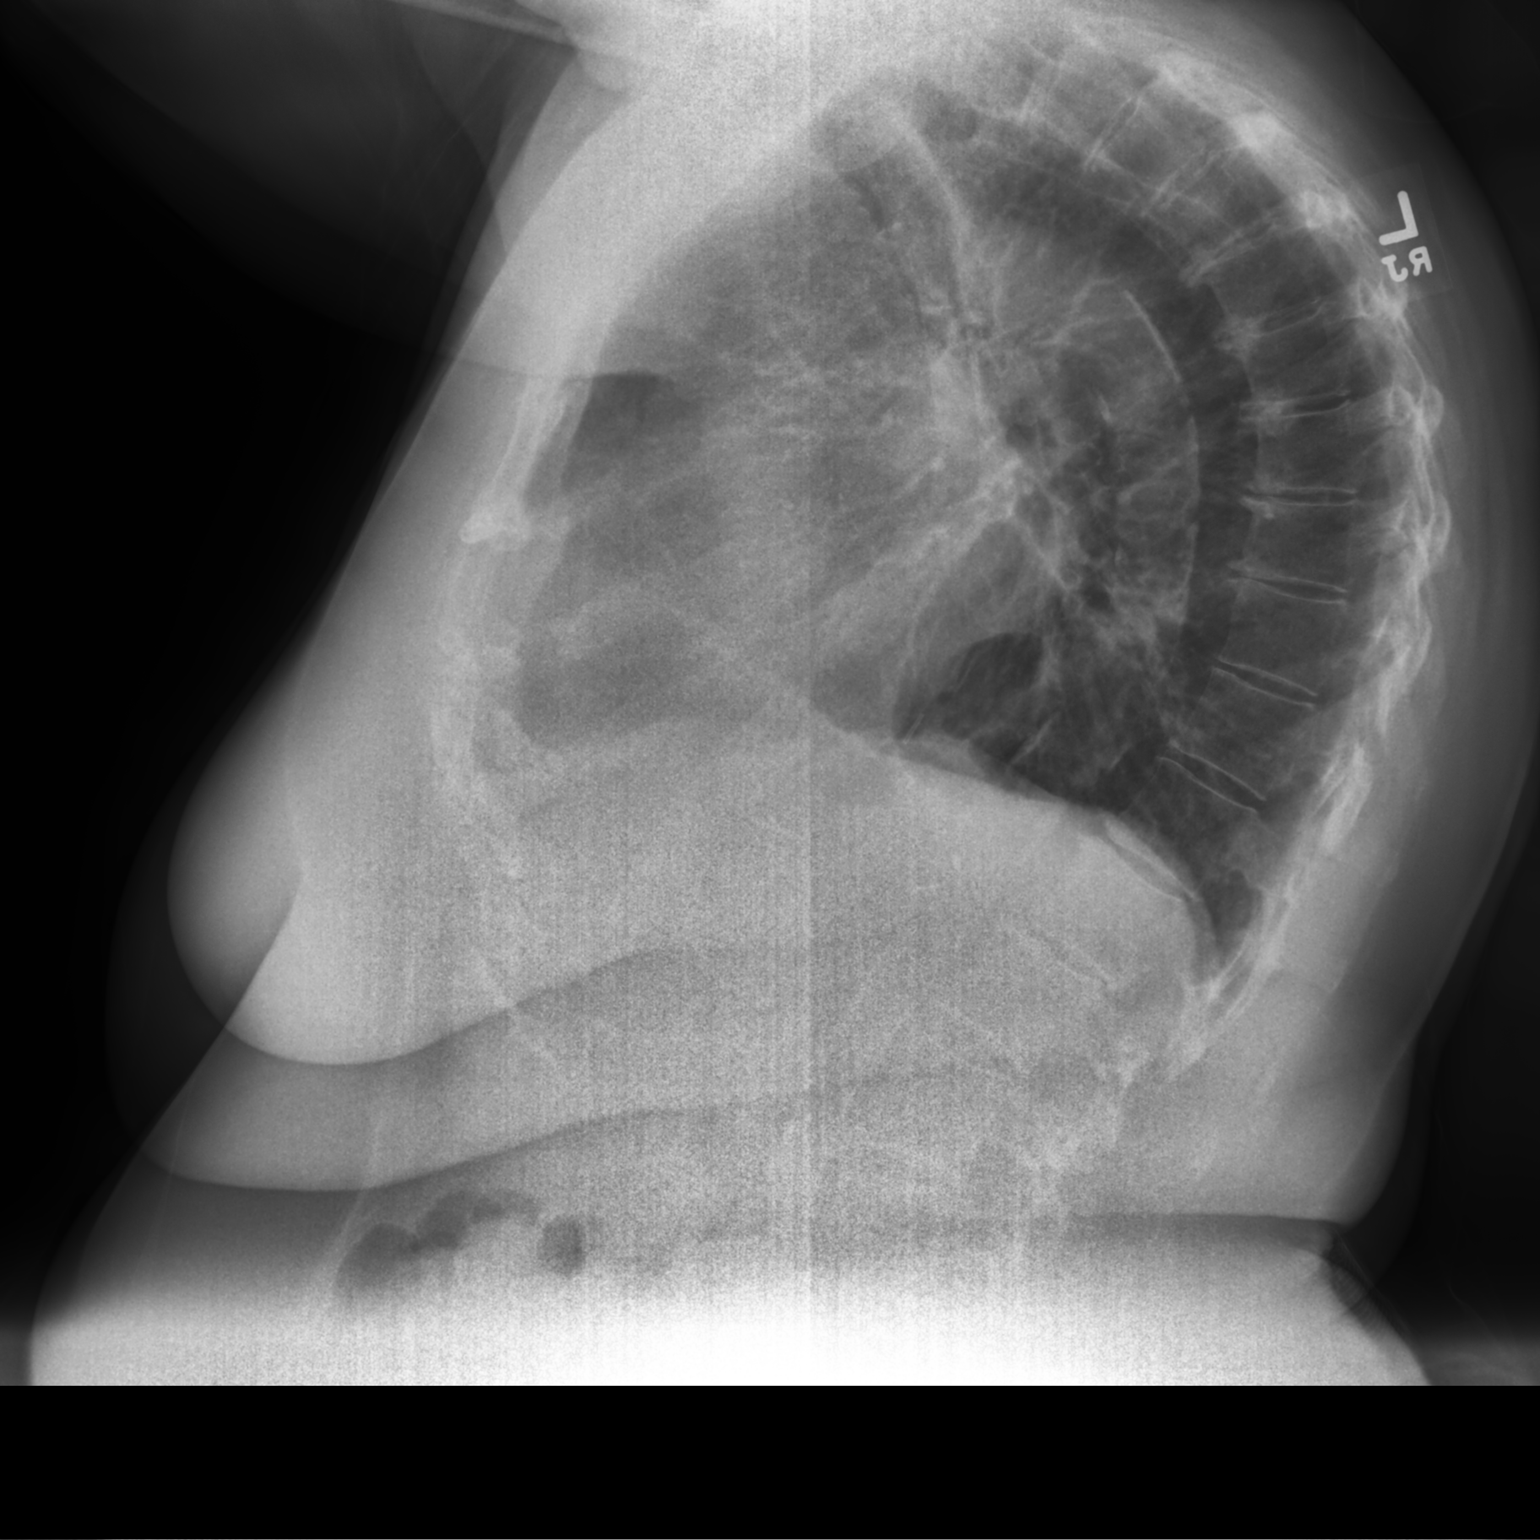

[2 of 2 positions shown; findings below may reference images not displayed]

FINDINGS: Lung volumes are normal. No consolidative airspace disease. No
pleural effusions. No pneumothorax. No pulmonary nodule or mass
noted. Pulmonary vasculature and the cardiomediastinal silhouette
are within normal limits. Atherosclerosis in the thoracic aorta.
IMPRESSION: 1.  No radiographic evidence of acute cardiopulmonary disease.
2. Aortic atherosclerosis.

## 2023-03-10 DIAGNOSIS — E538 Deficiency of other specified B group vitamins: Secondary | ICD-10-CM | POA: Diagnosis not present

## 2023-03-14 ENCOUNTER — Telehealth: Payer: Self-pay | Admitting: Cardiology

## 2023-03-14 NOTE — Telephone Encounter (Signed)
 I spoke with patient.  She reports she was in the shower this morning and started perspiring and felt weak.  She got out and put her watch on which showed afib.  Rate currently in the 80's.  Is taking Xarelto.  Feels a little weak at current time.  Patient reports Dr Jacinto Halim told her she should call office if she went into Afib.  Will forward to Dr Jacinto Halim for review/recommendations.

## 2023-03-14 NOTE — Telephone Encounter (Signed)
 Patient c/o Palpitations:  High priority if patient c/o lightheadedness, shortness of breath, or chest pain  How long have you had palpitations/irregular HR/ Afib? Are you having the symptoms now? For about 4 hrs now pt has been in Afib. Pt is just not feeling good and just knows when she goes into it. Pt is not having an symptoms right now.   Are you currently experiencing lightheadedness, SOB or CP? No  Do you have a history of afib (atrial fibrillation) or irregular heart rhythm? Yes  Have you checked your BP or HR? (document readings if available): 117 HR around 8am but now 85 HR. Pt hasn't checked BP  Are you experiencing any other symptoms? Not at the moment, but when it started she instantly started sweating and began to feel very drained.

## 2023-03-14 NOTE — Telephone Encounter (Signed)
 She can double up on diltiazem for 1 day and if she does not convert to sinus rhythm by tomorrow, may need to be seen in the A-fib clinic and set up for cardioversion.  No urgency.  She is already on anticoagulation.

## 2023-03-14 NOTE — Telephone Encounter (Signed)
 I spoke with patient and gave her message from Dr Jacinto Halim.  Appointment made for patient to see Dr Jacinto Halim on March 5.  She will call if she goes back into SR

## 2023-03-15 ENCOUNTER — Telehealth: Payer: Self-pay | Admitting: Cardiology

## 2023-03-15 NOTE — Telephone Encounter (Signed)
 Spoke with patient and she stated she went back into NSR yesterday. She states she feels better than yesterday but stills feel a little weak. She wanted to let you know she was now in NSR

## 2023-03-15 NOTE — Telephone Encounter (Signed)
 Patient called to report she went back into normal heart rhythm yesterday at 4:00 p.m.  Patient noted she is still feeling very weak.  See previous noted dated 3/3.

## 2023-03-16 ENCOUNTER — Ambulatory Visit: Attending: Cardiology | Admitting: Cardiology

## 2023-03-16 ENCOUNTER — Encounter: Payer: Self-pay | Admitting: Cardiology

## 2023-03-16 VITALS — BP 118/62 | HR 73 | Resp 16 | Ht 60.0 in | Wt 167.0 lb

## 2023-03-16 DIAGNOSIS — E7800 Pure hypercholesterolemia, unspecified: Secondary | ICD-10-CM

## 2023-03-16 DIAGNOSIS — I480 Paroxysmal atrial fibrillation: Secondary | ICD-10-CM

## 2023-03-16 DIAGNOSIS — Z5181 Encounter for therapeutic drug level monitoring: Secondary | ICD-10-CM | POA: Diagnosis not present

## 2023-03-16 DIAGNOSIS — I10 Essential (primary) hypertension: Secondary | ICD-10-CM

## 2023-03-16 MED ORDER — REPATHA SURECLICK 140 MG/ML ~~LOC~~ SOAJ: 140.0000 mg | SUBCUTANEOUS | 2 refills | Status: DC

## 2023-03-16 MED ORDER — FLECAINIDE ACETATE 50 MG PO TABS
50.0000 mg | ORAL_TABLET | Freq: Two times a day (BID) | ORAL | 3 refills | Status: DC
Start: 2023-03-16 — End: 2023-07-14

## 2023-03-16 NOTE — Progress Notes (Signed)
 Cardiology Office Note:  .   Date:  03/16/2023  ID:  Deborah Mcmillan, DOB [birth year], MRN [id number] PCP: Fatima Sanger, FNP  [facility] Providers Cardiologist:  Yates Decamp, MD   History of Present Illness: .   Deborah Mcmillan is a 89 y.o. Caucasian female with carotid atherosclerosis, hypertension and hyperlipidemia, LDL in the range of 170-190, myopathy, reactive airway disease and multiple medication intolerances, paroxysmal atrial fibrillation, had called [place] with recurrence of AF and I have doubled her Verapamil for a couple days and advised her that she may convert to sinus which she did. However she felt tired and wanted to follow up. She had similar dpisode in July 2024 and spontaneously converted after starting Verapamil.  Discussed the use of AI scribe software for clinical note transcription with the patient, who gave verbal consent to proceed.  History of Present Illness   Deborah, a patient with a history of atrial fibrillation (AFib), presents after a recent episode of AFib. She reports feeling fatigued after the episode, describing feeling "worn out" and "wiped out". She also mentions frequent urination, but notes that this is a common occurrence for her and did not notice any unusual increase in frequency after the AFib episode. She was able to self-identify the AFib episode using an EKG and reports that she has since returned to a regular rhythm. She also has high cholesterol and is on medication for it. She has been experiencing itching, which she attributes to a magnesium stearate component in her medications. She also mentions a recent feeling of fatigue and a strange sensation in her throat, raising concerns about a potential cold.      Labs   Lab Results  Component Value Date   CHOL 265 (H) 07/28/2022   HDL 68 07/28/2022   LDLCALC 163 (H) 07/28/2022   TRIG 187 (H) 07/28/2022   Lab Results  Component Value Date   NA 138 07/28/2022   K 4.7 07/28/2022   CO2  19 (L) 07/28/2022   GLUCOSE 111 (H) 07/28/2022   BUN 23 07/28/2022   CREATININE 1.07 (H) 07/28/2022   CALCIUM 9.5 07/28/2022   EGFR 49.0 10/13/2022   GFRNONAA 45 (L) 11/18/2021      Latest Ref Rng & Units 07/28/2022    1:13 PM 11/18/2021   11:18 AM 10/02/2021    9:45 PM  BMP  Glucose 70 - 99 mg/dL 409  811  89   BUN 8 - 27 mg/dL 23  23  28    Creatinine 0.57 - 1.00 mg/dL 9.14  7.82  9.56   BUN/Creat Ratio 12 - 28 21     Sodium 134 - 144 mmol/L 138  131  128   Potassium 3.5 - 5.2 mmol/L 4.7  4.6  4.4   Chloride 96 - 106 mmol/L 101  97  93   CO2 20 - 29 mmol/L 19  23  20    Calcium 8.7 - 10.3 mg/dL 9.5  21.3  08.6       Latest Ref Rng & Units 07/28/2022    1:13 PM 11/18/2021   11:18 AM 10/02/2021    9:45 PM  CBC  WBC 3.4 - 10.8 x10E3/uL 12.0  9.2  8.7   Hemoglobin 11.1 - 15.9 g/dL 57.8  46.9  62.9   Hematocrit 34.0 - 46.6 % 43.2  47.2  44.3   Platelets 150 - 450 x10E3/uL 245  253  177    No results found for: "HGBA1C"  No  results found for: "TSH"   Review of Systems  Constitutional: Positive for malaise/fatigue.  Cardiovascular:  Positive for palpitations. Negative for chest pain, dyspnea on exertion and leg swelling.   Physical Exam:   VS:  BP 118/62 (BP Location: Left Arm, Patient Position: Sitting, Cuff Size: Normal)   Pulse 73   Resp 16   Ht 5' (1.524 m)   Wt 167 lb (75.8 kg)   SpO2 97%   BMI 32.61 kg/m    Wt Readings from Last 3 Encounters:  03/16/23 167 lb (75.8 kg)  09/14/22 162 lb (73.5 kg)  07/28/22 165 lb (74.8 kg)    Physical Exam Neck:     Vascular: No carotid bruit or JVD.  Cardiovascular:     Rate and Rhythm: Normal rate and regular rhythm.     Pulses: Intact distal pulses.     Heart sounds: Normal heart sounds. No murmur heard.    No gallop.  Pulmonary:     Effort: Pulmonary effort is normal.     Breath sounds: Normal breath sounds.  Abdominal:     General: Bowel sounds are normal.     Palpations: Abdomen is soft.  Musculoskeletal:     Right  lower leg: No edema.     Left lower leg: No edema.    Studies Reviewed: .    Echocardiogram 08/17/2022: EKG: Sinus Rhythm. Normal LV systolic function with visual EF 60-65%. Left ventricle cavity is normal in size. Normal left ventricular wall thickness. Normal global wall motion. Normal diastolic filling pattern, normal LAP. Mild (Grade I) aortic regurgitation. Mild (Grade I) mitral regurgitation. Mild tricuspid regurgitation. No evidence of pulmonary hypertension. RVSP measures 30 mmHg. Mild pulmonic regurgitation. Compared to 04/03/2020 no significant change.   EKG:    EKG Interpretation Date/Time:  Wednesday March 16 2023 14:22:16 EST Ventricular Rate:  73 PR Interval:  196 QRS Duration:  82 QT Interval:  368 QTC Calculation: 405 R Axis:   21  Text Interpretation: EKG 03/16/2023: Normal sinus rhythm at the rate of 73 bpm, normal EKG.  Compared to 11/18/2021, no change. Confirmed by Delrae Rend [phone] on 03/16/2023 2:54:35 PM    EKG 09/14/2022: Normal sinus rhythm at rate of 63 bpm, normal axis, no evidence of ischemia, normal EKG. Compared to 08/13/2022, no significant change   Medications and allergies    Allergies  Allergen Reactions   Calcium-Containing Compounds Other (See Comments)    Muscle cramping   Magnesium-Containing Compounds Itching and Rash   Tape Itching and Rash    Plastic, and "brown tape"   Alirocumab     Other Reaction(s): Other (See Comments)  Generalized weakness   Levofloxacin Other (See Comments)   Nitrofurantoin Other (See Comments)    Unknown   Triamcinolone Other (See Comments)   Triamcinolone Acetonide Other (See Comments)   Welchol [Colesevelam Hcl] Other (See Comments)    Unknown   Cefuroxime Axetil Other (See Comments)    leg pain   Codeine Nausea Only    Pain & upset stomach   Crestor [Rosuvastatin] Other (See Comments)    Leg cramping   Lodine [Etodolac] Other (See Comments)    Upset stomach   Niacin And Related Rash    "on  fire"   Penicillins Itching, Swelling and Rash    She reports some swelling and itchy.    Reclast [Zoledronic Acid] Other (See Comments)    Caused severe jaw pain   Sulfa Antibiotics Other (See Comments) and Nausea Only  Pain & upset stomach  Other Reaction(s): GI Intolerance   Tetracyclines & Related Rash    Upset stomach   Welchol [Colesevelam] Nausea Only and Other (See Comments)    Stomach did not feel good   Zetia [Ezetimibe] Other (See Comments)    Muscle pain     Current Outpatient Medications:    albuterol (VENTOLIN HFA) 108 (90 Base) MCG/ACT inhaler, Inhale 1 puff into the lungs every 4 (four) hours as needed for wheezing or shortness of breath., Disp: 1 each, Rfl: 11   Cyanocobalamin (B-12 COMPLIANCE INJECTION) 1000 MCG/ML KIT, Inject as directed every 30 (thirty) days., Disp: , Rfl:    Cyanocobalamin (VITAMIN B-12 PO), Take 3,000 mcg by mouth daily., Disp: , Rfl:    denosumab (PROLIA) 60 MG/ML SOSY injection, Inject 60 mg into the skin every 6 (six) months., Disp: , Rfl:    diltiazem (CARDIZEM CD) 120 MG 24 hr capsule, Take 1 capsule (120 mg total) by mouth daily., Disp: 90 capsule, Rfl: 3   Evolocumab (REPATHA SURECLICK) 140 MG/ML SOAJ, Inject 140 mg into the skin every 14 (fourteen) days., Disp: 63 mL, Rfl: 2   flecainide (TAMBOCOR) 50 MG tablet, Take 1 tablet (50 mg total) by mouth 2 (two) times daily., Disp: 60 tablet, Rfl: 3   fluticasone (FLONASE) 50 MCG/ACT nasal spray, Place 2 sprays into both nostrils daily as needed., Disp: , Rfl:    magnesium oxide (MAG-OX) 400 (240 Mg) MG tablet, Take 400 mg by mouth daily., Disp: , Rfl:    metoprolol succinate (TOPROL-XL) 100 MG 24 hr tablet, TAKE 1 TABLET(100 MG) BY MOUTH EVERY EVENING (Patient taking differently: Take 50 mg by mouth at bedtime.), Disp: 90 tablet, Rfl: 1   Multiple Vitamin (MULTIVITAMIN WITH MINERALS) TABS tablet, Take 1 tablet by mouth daily., Disp: , Rfl:    omeprazole (PRILOSEC) 20 MG capsule, Take 20 mg by  mouth in the morning., Disp: , Rfl:    Rivaroxaban (XARELTO) 15 MG TABS tablet, Take 1 tablet (15 mg total) by mouth daily with supper., Disp: 30 tablet, Rfl: 5   spironolactone (ALDACTONE) 50 MG tablet, TAKE 1 TABLET BY MOUTH EVERY MORNING, Disp: 90 tablet, Rfl: 3   ASSESSMENT AND PLAN: .      ICD-10-CM   1. Paroxysmal atrial fibrillation (HCC)  I48.0 EKG 12-Lead    flecainide (TAMBOCOR) 50 MG tablet    Cardiac Stress Test: Informed Consent Details: Physician/Practitioner Attestation; Transcribe to consent form and obtain patient signature    Exercise Tolerance Test    2. Primary hypertension  I10     3. Hypercholesteremia  E78.00 Evolocumab (REPATHA SURECLICK) 140 MG/ML SOAJ    4. Therapeutic drug monitoring  Z51.81 Cardiac Stress Test: Informed Consent Details: Physician/Practitioner Attestation; Transcribe to consent form and obtain patient signature    Exercise Tolerance Test     Assessment and Plan    Atrial Fibrillation (AFib)   Recurrent AFib episodes, most recently after a cold, cause significant post-episode fatigue likely due to electrolyte disturbances. She is currently on diltiazem for rate control. Antiarrhythmic therapy and ablation were discussed, with a preference for medication first. Flecainide can reduce AFib recurrence, and a treadmill stress test is needed to monitor for medication-induced arrhythmias. Ablation remains an alternative with a low recurrence rate. Prescribe flecainide 50 mg twice daily, schedule a treadmill stress test in 10-14 days, continue diltiazem, and follow up in 6 months.  Hyperlipidemia   Repatha, previously effective, has not been received. She prefers to manage  cholesterol to reduce cardiovascular risk. Send a prescription for Repatha and instruct her to contact the office if there are issues obtaining the medication.  General Health Maintenance   Cholesterol management was discussed to prevent cardiovascular events. Monitor cholesterol  levels and encourage medication adherence. Follow up in 6 months.           Signed,  Yates Decamp, MD, [facility] 03/16/2023, 8:47 PM [facility] [address] [place], [place] [address] Phone: [phone]. Fax:  [phone]

## 2023-03-16 NOTE — Telephone Encounter (Signed)
 I spoke with patient.  She feels she is in SR but still feels weak.  She would like to keep appointment today with Dr Jacinto Halim

## 2023-03-16 NOTE — Patient Instructions (Signed)
 Medication Instructions:  Your physician has recommended you make the following change in your medication: Start flecainide 50 mg by mouth twice daily   *If you need a refill on your cardiac medications before your next appointment, please call your pharmacy*   Lab Work: none If you have labs (blood work) drawn today and your tests are completely normal, you will receive your results only by: MyChart Message (if you have MyChart) OR A paper copy in the mail If you have any lab test that is abnormal or we need to change your treatment, we will call you to review the results.   Testing/Procedures: Your physician has requested that you have an exercise tolerance test in 10 days to 2 weeks. . For further information please visit [email or web address] Please also follow instruction sheet, as given.    Follow-Up: At [facility], you and your health needs are our priority.  As part of our continuing mission to provide you with exceptional heart care, we have created designated Provider Care Teams.  These Care Teams include your primary Cardiologist (physician) and Advanced Practice Providers (APPs -  Physician Assistants and Nurse Practitioners) who all work together to provide you with the care you need, when you need it.  We recommend signing up for the patient portal called "MyChart".  Sign up information is provided on this After Visit Summary.  MyChart is used to connect with patients for Virtual Visits (Telemedicine).  Patients are able to view lab/test results, encounter notes, upcoming appointments, etc.  Non-urgent messages can be sent to your provider as well.   To learn more about what you can do with MyChart, go to [email or web address].    Your next appointment:   6 month(s)  Provider:   Yates Decamp, MD     Other Instructions   Stress test instructions-   Please arrive 15 minutes prior to your appointment time to allow for registration and insurance purposes.  The  test will take approximately 45 minutes to complete.  How to prepare for your Exercise Stress Test: - Do bring a list of your current medications with you. If you do not take any of the medications listed below, you may take your medications as normal the day of the test. You may take all your normal medications. - DO wear comfortable clothes (no dresses or overalls) and walking shoes, tennis shoes preferred (no heels or open toed shoes allowed).

## 2023-03-29 ENCOUNTER — Ambulatory Visit

## 2023-03-31 ENCOUNTER — Ambulatory Visit

## 2023-03-31 ENCOUNTER — Telehealth: Payer: Self-pay | Admitting: *Deleted

## 2023-03-31 NOTE — Telephone Encounter (Signed)
 Patient canceled GXT appointments on 3/18 and 3/20 due to stomach issues.  Per Dr Jacinto Halim -  She is on metoprolol succinate 100 mg daily, probably the etiology for her diarrhea. Subclinical dizziness to be made and if indeed she is having frequent episodes of loose stools, if she thinks it is related to medications as patient is very aware of her medications and she is very particular with the medications, we could reduce the dose to 50 mg daily, or stop the medicine for a week and see if she does well.   I spoke with patient.  She had diarrhea on 3/18 throughout the morning.  Started again with stomach issues last evening.  Her son reports the same issues.  Patient feels she has a stomach bug.  If she does not improve she will let us know and medication changes can be made at that time.

## 2023-04-05 ENCOUNTER — Other Ambulatory Visit (HOSPITAL_COMMUNITY): Payer: Self-pay

## 2023-04-11 ENCOUNTER — Telehealth: Payer: Self-pay

## 2023-04-11 ENCOUNTER — Other Ambulatory Visit (HOSPITAL_COMMUNITY): Payer: Self-pay

## 2023-04-11 NOTE — Telephone Encounter (Signed)
 Pharmacy Patient Advocate Encounter   Received notification from CoverMyMeds that prior authorization for REPATHA is required/requested.   Insurance verification completed.   The patient is insured through [facility] .   Per test claim: PA required; PA submitted to above mentioned insurance via CoverMyMeds Key/confirmation #/EOC [id number] Status is pending

## 2023-04-12 DIAGNOSIS — E538 Deficiency of other specified B group vitamins: Secondary | ICD-10-CM | POA: Diagnosis not present

## 2023-04-12 DIAGNOSIS — E559 Vitamin D deficiency, unspecified: Secondary | ICD-10-CM | POA: Diagnosis not present

## 2023-04-12 DIAGNOSIS — E7800 Pure hypercholesterolemia, unspecified: Secondary | ICD-10-CM | POA: Diagnosis not present

## 2023-04-12 DIAGNOSIS — Z0000 Encounter for general adult medical examination without abnormal findings: Secondary | ICD-10-CM | POA: Diagnosis not present

## 2023-04-13 LAB — LAB REPORT - SCANNED: EGFR: 48

## 2023-04-14 ENCOUNTER — Other Ambulatory Visit (HOSPITAL_COMMUNITY): Payer: Self-pay

## 2023-04-14 NOTE — Telephone Encounter (Signed)
 Pharmacy Patient Advocate Encounter  Received notification from [facility] that Prior Authorization for REPATHA has been APPROVED from 04/11/23 to 10/11/23

## 2023-04-21 ENCOUNTER — Encounter: Payer: Self-pay | Admitting: Registered Nurse

## 2023-04-21 DIAGNOSIS — E559 Vitamin D deficiency, unspecified: Secondary | ICD-10-CM | POA: Diagnosis not present

## 2023-04-21 DIAGNOSIS — G720 Drug-induced myopathy: Secondary | ICD-10-CM | POA: Diagnosis not present

## 2023-04-21 DIAGNOSIS — I10 Essential (primary) hypertension: Secondary | ICD-10-CM | POA: Diagnosis not present

## 2023-04-21 DIAGNOSIS — Z23 Encounter for immunization: Secondary | ICD-10-CM | POA: Diagnosis not present

## 2023-04-21 DIAGNOSIS — E7800 Pure hypercholesterolemia, unspecified: Secondary | ICD-10-CM | POA: Diagnosis not present

## 2023-04-21 DIAGNOSIS — M810 Age-related osteoporosis without current pathological fracture: Secondary | ICD-10-CM | POA: Diagnosis not present

## 2023-04-21 DIAGNOSIS — I480 Paroxysmal atrial fibrillation: Secondary | ICD-10-CM | POA: Diagnosis not present

## 2023-04-21 DIAGNOSIS — N3281 Overactive bladder: Secondary | ICD-10-CM | POA: Diagnosis not present

## 2023-04-21 DIAGNOSIS — Z0000 Encounter for general adult medical examination without abnormal findings: Secondary | ICD-10-CM | POA: Diagnosis not present

## 2023-04-21 DIAGNOSIS — T466X5A Adverse effect of antihyperlipidemic and antiarteriosclerotic drugs, initial encounter: Secondary | ICD-10-CM | POA: Diagnosis not present

## 2023-04-21 DIAGNOSIS — I6523 Occlusion and stenosis of bilateral carotid arteries: Secondary | ICD-10-CM | POA: Diagnosis not present

## 2023-04-26 ENCOUNTER — Telehealth (HOSPITAL_COMMUNITY): Payer: Self-pay

## 2023-05-02 ENCOUNTER — Telehealth (HOSPITAL_COMMUNITY): Payer: Self-pay

## 2023-05-02 NOTE — Telephone Encounter (Signed)
 Spoke with the patient and she will be here for her test. Deborah Mcmillan CCT

## 2023-05-02 NOTE — Telephone Encounter (Signed)
 Spoke with the patient, detailed instructions given. We will be doing a modified GXT. She is unable to walk fast or up a hill. Reassured the patient of what we will be doing. S.Darnetta Kesselman CCT

## 2023-05-03 ENCOUNTER — Ambulatory Visit (HOSPITAL_COMMUNITY): Attending: Cardiology

## 2023-05-03 DIAGNOSIS — Z5181 Encounter for therapeutic drug level monitoring: Secondary | ICD-10-CM

## 2023-05-03 DIAGNOSIS — I480 Paroxysmal atrial fibrillation: Secondary | ICD-10-CM | POA: Diagnosis not present

## 2023-05-04 ENCOUNTER — Encounter: Payer: Self-pay | Admitting: Cardiology

## 2023-05-04 LAB — EXERCISE TOLERANCE TEST
Base ST Depression (mm): 0 mm
Estimated workload: 3.9
Exercise duration (min): 6 min
Exercise duration (sec): 53 s
MPHR: 133 {beats}/min
Peak HR: 131 {beats}/min
Percent HR: 98 %
RPE: 18
Rest HR: 66 {beats}/min
ST Depression (mm): 0 mm

## 2023-05-04 NOTE — Progress Notes (Signed)
 Your stress test/heart rate response is appropriate, continue present medications.  This test was done to evaluate the efficacy of the medications.

## 2023-06-07 DIAGNOSIS — E538 Deficiency of other specified B group vitamins: Secondary | ICD-10-CM | POA: Diagnosis not present

## 2023-06-16 ENCOUNTER — Telehealth: Payer: Self-pay | Admitting: Cardiology

## 2023-06-16 DIAGNOSIS — E7800 Pure hypercholesterolemia, unspecified: Secondary | ICD-10-CM

## 2023-06-16 MED ORDER — FEXOFENADINE HCL 60 MG PO TABS: ORAL_TABLET | ORAL | Status: AC

## 2023-06-16 NOTE — Telephone Encounter (Signed)
 Pt c/o medication issue:  1. Name of Medication:   Evolocumab  (REPATHA  SURECLICK) 140 MG/ML SOAJ    2. How are you currently taking this medication (dosage and times per day)? Inject 140 mg into the skin every 14 (fourteen) days.   3. Are you having a reaction (difficulty breathing--STAT)? No   4. What is your medication issue? Patient is calling because she is allegoric to [facility] and realized this medication has Lasik in it. Patient stated the pharmacist informed the patient to no longer take the medication. Patient would like to know if there is an alternative medication she can take. Please advise.

## 2023-06-16 NOTE — Telephone Encounter (Signed)
 Patient identification verified by 2 forms. Deborah Duck, RN     Called and spoke to patient  Patient states:  - She has taken Praluent  in past but was experiencing weakness so dr. Berry Bristol switched patient to Repatha . - She has taken four doses of repatha  so far and has experienced itching at night under breast, arms, legs, feet and back that was relieved on its own.  - She is allergic to magnesium  containing compounds.  - took last dose 2 weeks ago of repatha , still experiencing itching but not as severe.   Patient denies:  - SOB, swelling, CP, dizziness, vision changes, muscle weakness/pain attime of call.              Interventions/Plan: - Encounter forwarded to primary cardiologist and pharmacy team for review/ recommendations.    Reviewed ED warning signs/precautions  Patient agrees with plan, no questions at this time

## 2023-06-16 NOTE — Telephone Encounter (Signed)
 Deborah Perl, MD  [facility] Div [facility] Triage; Cv Div Pharmd13 minutes ago (3:50 PM)     She can try taking Allegra for 3 days PRN starting 1 day before Repatha .     Pt advised and will try and let us  know how she does.

## 2023-06-17 ENCOUNTER — Other Ambulatory Visit: Payer: Self-pay | Admitting: Pharmacist

## 2023-06-17 DIAGNOSIS — E7800 Pure hypercholesterolemia, unspecified: Secondary | ICD-10-CM

## 2023-06-20 ENCOUNTER — Other Ambulatory Visit (HOSPITAL_COMMUNITY): Payer: Self-pay

## 2023-06-20 MED ORDER — REPATHA SURECLICK 140 MG/ML ~~LOC~~ SOAJ
140.0000 mg | SUBCUTANEOUS | 11 refills | Status: AC
  Filled 2023-06-20: qty 2, 28d supply, fill #0
  Filled 2024-01-11 (×2): qty 6, 84d supply, fill #1

## 2023-06-20 NOTE — Addendum Note (Signed)
 Addended by: Aariana Shankland D on: 06/20/2023 02:54 PM   Modules accepted: Orders

## 2023-06-20 NOTE — Telephone Encounter (Signed)
 Called pt to let her know [facility] should be able to get her Latex free Repatha  [employer]. She wants to check with her insurance and make sure they will pay for that pharmacy. I will double check tomorrow that the latex free came in.

## 2023-06-21 ENCOUNTER — Other Ambulatory Visit (HOSPITAL_COMMUNITY): Payer: Self-pay

## 2023-06-22 ENCOUNTER — Other Ambulatory Visit (HOSPITAL_COMMUNITY): Payer: Self-pay

## 2023-06-29 ENCOUNTER — Other Ambulatory Visit: Payer: Self-pay

## 2023-07-01 ENCOUNTER — Other Ambulatory Visit (HOSPITAL_BASED_OUTPATIENT_CLINIC_OR_DEPARTMENT_OTHER): Payer: Self-pay

## 2023-07-01 MED FILL — Rivaroxaban Tab 15 MG: ORAL | 30 days supply | Qty: 30 | Fill #0 | Status: AC

## 2023-07-05 DIAGNOSIS — E538 Deficiency of other specified B group vitamins: Secondary | ICD-10-CM | POA: Diagnosis not present

## 2023-07-11 ENCOUNTER — Other Ambulatory Visit (HOSPITAL_BASED_OUTPATIENT_CLINIC_OR_DEPARTMENT_OTHER): Payer: Self-pay

## 2023-07-11 MED ORDER — REPATHA SURECLICK 140 MG/ML ~~LOC~~ SOAJ
140.0000 mg | SUBCUTANEOUS | 2 refills | Status: AC
  Filled 2023-07-19 – 2023-07-21 (×2): qty 6, 84d supply, fill #0
  Filled 2023-10-15: qty 6, 84d supply, fill #1

## 2023-07-11 MED ORDER — IPRATROPIUM BROMIDE 0.03 % NA SOLN
2.0000 | Freq: Two times a day (BID) | NASAL | 3 refills | Status: AC
Start: 2023-03-10 — End: ?
  Filled 2023-07-13 – 2023-12-26 (×2): qty 90, 90d supply, fill #0

## 2023-07-11 MED ORDER — SOLIFENACIN SUCCINATE 10 MG PO TABS
10.0000 mg | ORAL_TABLET | Freq: Every day | ORAL | 3 refills | Status: DC
  Filled 2023-07-13: qty 90, 90d supply, fill #0

## 2023-07-13 ENCOUNTER — Other Ambulatory Visit (HOSPITAL_BASED_OUTPATIENT_CLINIC_OR_DEPARTMENT_OTHER): Payer: Self-pay

## 2023-07-13 MED FILL — Spironolactone Tab 50 MG: ORAL | 90 days supply | Qty: 90 | Fill #0 | Status: AC

## 2023-07-14 ENCOUNTER — Other Ambulatory Visit: Payer: Self-pay | Admitting: Cardiology

## 2023-07-14 ENCOUNTER — Other Ambulatory Visit (HOSPITAL_BASED_OUTPATIENT_CLINIC_OR_DEPARTMENT_OTHER): Payer: Self-pay

## 2023-07-14 ENCOUNTER — Other Ambulatory Visit: Payer: Self-pay

## 2023-07-14 DIAGNOSIS — I480 Paroxysmal atrial fibrillation: Secondary | ICD-10-CM

## 2023-07-14 NOTE — Telephone Encounter (Signed)
 Patient called in requesting refill on flecainide  50mg  BID be sent to [facility] as [facility] pharmacy closed and unstable to fill. Rx sent for 60tabs

## 2023-07-14 NOTE — Progress Notes (Signed)
 error

## 2023-07-19 ENCOUNTER — Other Ambulatory Visit (HOSPITAL_BASED_OUTPATIENT_CLINIC_OR_DEPARTMENT_OTHER): Payer: Self-pay

## 2023-07-19 ENCOUNTER — Other Ambulatory Visit: Payer: Self-pay

## 2023-07-20 ENCOUNTER — Other Ambulatory Visit (HOSPITAL_BASED_OUTPATIENT_CLINIC_OR_DEPARTMENT_OTHER): Payer: Self-pay

## 2023-07-20 ENCOUNTER — Other Ambulatory Visit: Payer: Self-pay

## 2023-07-21 ENCOUNTER — Other Ambulatory Visit (HOSPITAL_COMMUNITY): Payer: Self-pay

## 2023-07-21 ENCOUNTER — Other Ambulatory Visit (HOSPITAL_BASED_OUTPATIENT_CLINIC_OR_DEPARTMENT_OTHER): Payer: Self-pay

## 2023-07-22 ENCOUNTER — Other Ambulatory Visit (HOSPITAL_BASED_OUTPATIENT_CLINIC_OR_DEPARTMENT_OTHER): Payer: Self-pay

## 2023-07-26 ENCOUNTER — Other Ambulatory Visit: Payer: Self-pay

## 2023-07-26 ENCOUNTER — Emergency Department (HOSPITAL_BASED_OUTPATIENT_CLINIC_OR_DEPARTMENT_OTHER)
Admission: EM | Admit: 2023-07-26 | Discharge: 2023-07-26 | Disposition: A | Discharge status: Home / Self Care | Attending: Emergency Medicine | Admitting: Emergency Medicine

## 2023-07-26 ENCOUNTER — Encounter (HOSPITAL_BASED_OUTPATIENT_CLINIC_OR_DEPARTMENT_OTHER): Payer: Self-pay | Admitting: *Deleted

## 2023-07-26 ENCOUNTER — Other Ambulatory Visit (HOSPITAL_BASED_OUTPATIENT_CLINIC_OR_DEPARTMENT_OTHER): Payer: Self-pay

## 2023-07-26 DIAGNOSIS — Z79899 Other long term (current) drug therapy: Secondary | ICD-10-CM | POA: Diagnosis not present

## 2023-07-26 DIAGNOSIS — R001 Bradycardia, unspecified: Secondary | ICD-10-CM | POA: Diagnosis not present

## 2023-07-26 DIAGNOSIS — Z91040 Latex allergy status: Secondary | ICD-10-CM | POA: Diagnosis not present

## 2023-07-26 DIAGNOSIS — Z7901 Long term (current) use of anticoagulants: Secondary | ICD-10-CM | POA: Diagnosis not present

## 2023-07-26 DIAGNOSIS — I480 Paroxysmal atrial fibrillation: Secondary | ICD-10-CM | POA: Diagnosis not present

## 2023-07-26 DIAGNOSIS — I10 Essential (primary) hypertension: Secondary | ICD-10-CM | POA: Diagnosis not present

## 2023-07-26 DIAGNOSIS — R531 Weakness: Secondary | ICD-10-CM | POA: Diagnosis present

## 2023-07-26 LAB — COMPREHENSIVE METABOLIC PANEL WITH GFR
ALT: 15 U/L (ref 0–44)
AST: 22 U/L (ref 15–41)
Albumin: 4.6 g/dL (ref 3.5–5.0)
Alkaline Phosphatase: 48 U/L (ref 38–126)
Anion gap: 13 (ref 5–15)
BUN: 20 mg/dL (ref 8–23)
CO2: 19 mmol/L — ABNORMAL LOW (ref 22–32)
Calcium: 10.4 mg/dL — ABNORMAL HIGH (ref 8.9–10.3)
Chloride: 103 mmol/L (ref 98–111)
Creatinine, Ser: 1.07 mg/dL — ABNORMAL HIGH (ref 0.44–1.00)
GFR, Estimated: 50 mL/min — ABNORMAL LOW (ref >60)
Glucose, Bld: 105 mg/dL — ABNORMAL HIGH (ref 70–99)
Potassium: 4.8 mmol/L (ref 3.5–5.1)
Sodium: 135 mmol/L (ref 135–145)
Total Bilirubin: 0.4 mg/dL (ref 0.0–1.2)
Total Protein: 7.4 g/dL (ref 6.5–8.1)

## 2023-07-26 LAB — CBC
HCT: 43.7 % (ref 36.0–46.0)
Hemoglobin: 14.1 g/dL (ref 12.0–15.0)
MCH: 29.8 pg (ref 26.0–34.0)
MCHC: 32.3 g/dL (ref 30.0–36.0)
MCV: 92.4 fL (ref 80.0–100.0)
Platelets: 201 K/uL (ref 150–400)
RBC: 4.73 MIL/uL (ref 3.87–5.11)
RDW: 13.3 % (ref 11.5–15.5)
WBC: 10.6 K/uL — ABNORMAL HIGH (ref 4.0–10.5)
nRBC: 0 % (ref 0.0–0.2)

## 2023-07-26 LAB — URINALYSIS, ROUTINE W REFLEX MICROSCOPIC
Bilirubin Urine: NEGATIVE
Glucose, UA: NEGATIVE mg/dL
Hgb urine dipstick: NEGATIVE
Ketones, ur: NEGATIVE mg/dL
Leukocytes,Ua: NEGATIVE
Nitrite: NEGATIVE
Protein, ur: NEGATIVE mg/dL
Specific Gravity, Urine: 1.012 (ref 1.005–1.030)
pH: 6 (ref 5.0–8.0)

## 2023-07-26 MED ORDER — METOPROLOL SUCCINATE ER 25 MG PO TB24
25.0000 mg | ORAL_TABLET | Freq: Every day | ORAL | 0 refills | Status: DC
  Filled 2023-07-26: qty 20, 20d supply, fill #0

## 2023-07-26 MED FILL — Rivaroxaban Tab 15 MG: ORAL | 30 days supply | Qty: 30 | Fill #1 | Status: AC

## 2023-07-26 NOTE — ED Provider Notes (Signed)
 [name] AT [facility] Provider Note   CSN: [id number] Arrival date & time: 07/26/23  1200     Patient presents with: Hypertension and Headache   Deborah Mcmillan is a 89 y.o. female.   Patient to ED for evaluation of symptoms of generalized fatigue and weakness over the last several days. She states her Apple watch has been showing a heart rate in the 40's with atrial fibrillation. She has a history of paroxysmal a-fib, followed by Dr. Ladona. She is anticoagulated on Xarelto . She states the last time she had current symptoms in early March, Dr. Ladona doubled her verapamil for 2 days. She also reports a frontal headache that has been a daily headache for the past couple of months. Better with Tylenol . She feels it may be related to allergies. The headache is unchanged today.  The history is provided by the patient and a relative. No language interpreter was used.  Hypertension Associated symptoms include headaches.  Headache      Prior to Admission medications   Medication Sig Start Date End Date Taking? Authorizing Provider  metoprolol  succinate (TOPROL -XL) 25 MG 24 hr tablet Take 1 tablet (25 mg total) by mouth daily. 07/26/23  Yes Ceirra Belli, Margit, PA-C  albuterol  (VENTOLIN  HFA) 108 (90 Base) MCG/ACT inhaler Inhale 1 puff into the lungs every 4 (four) hours as needed for wheezing or shortness of breath. 09/25/20   Hunsucker, Donnice SAUNDERS, MD  Cyanocobalamin  (B-12 COMPLIANCE INJECTION) 1000 MCG/ML KIT Inject as directed every 30 (thirty) days.    [provider]  Cyanocobalamin  (VITAMIN B-12 PO) Take 3,000 mcg by mouth daily.    [provider]  denosumab  (PROLIA ) 60 MG/ML SOSY injection Inject 60 mg into the skin every 6 (six) months.    [provider]  diltiazem  (CARDIZEM  CD) 120 MG 24 hr capsule Take 1 capsule (120 mg total) by mouth daily. 09/14/22   Ladona Heinz, MD  Evolocumab  (REPATHA  SURECLICK) 140 MG/ML SOAJ Inject 140 mg  into the skin every 14 (fourteen) days. 06/20/23   Ladona Heinz, MD  Evolocumab  (REPATHA  SURECLICK) 140 MG/ML SOAJ Inject 140 mg into the skin every 14 (fourteen) days. 03/17/23   Ladona Heinz, MD  fexofenadine  (ALLEGRA  ALLERGY ) 60 MG tablet Take one tablet (60 mg) by mouth for 3 days as needed starting the day before her Repatha . 06/16/23   Ladona Heinz, MD  flecainide  (TAMBOCOR ) 50 MG tablet TAKE 1 TABLET(50 MG) BY MOUTH TWICE DAILY 07/18/23   Ladona Heinz, MD  fluticasone  (FLONASE ) 50 MCG/ACT nasal spray Place 2 sprays into both nostrils daily as needed. 10/08/21   [provider]  ipratropium (ATROVENT ) 0.03 % nasal spray Place 2 sprays into both nostrils 2 (two) times daily. 03/10/23     magnesium  oxide (MAG-OX) 400 (240 Mg) MG tablet Take 400 mg by mouth daily.    [provider]  metoprolol  succinate (TOPROL -XL) 100 MG 24 hr tablet TAKE 1 TABLET(100 MG) BY MOUTH EVERY EVENING Patient taking differently: Take 50 mg by mouth at bedtime. 04/19/22   Ladona Heinz, MD  Multiple Vitamin (MULTIVITAMIN WITH MINERALS) TABS tablet Take 1 tablet by mouth daily.    [provider]  omeprazole (PRILOSEC) 20 MG capsule Take 20 mg by mouth in the morning.    [provider]  Rivaroxaban  (XARELTO ) 15 MG TABS tablet Take 1 tablet (15 mg total) by mouth daily with supper. 01/13/23   Ladona Heinz, MD  solifenacin  (VESICARE ) 10 MG tablet Take 1  tablet (10 mg total) by mouth daily. 04/22/23     spironolactone  (ALDACTONE ) 50 MG tablet Take 1 tablet (50 mg total) by mouth in the morning. 10/06/22   Ladona Heinz, MD    Allergies: Calcium-containing compounds, Latex, Magnesium -containing compounds, Tape, Alirocumab , Levofloxacin , Nitrofurantoin, Triamcinolone , Triamcinolone  acetonide, Welchol [colesevelam hcl], Cefuroxime axetil, Codeine, Crestor [rosuvastatin], Lodine [etodolac], Niacin and related, Penicillins, Reclast [zoledronic acid], Sulfa antibiotics, Tetracyclines & related, Welchol [colesevelam], and  Zetia [ezetimibe]    Review of Systems  Neurological:  Positive for headaches.    Updated Vital Signs BP (!) 152/91   Pulse (!) 59   Temp 98 F (36.7 C)   Resp 12   SpO2 97%   Physical Exam Vitals and nursing note reviewed.  Constitutional:      Appearance: She is well-developed.  HENT:     Head: Normocephalic.  Cardiovascular:     Rate and Rhythm: Normal rate and regular rhythm.     Heart sounds: No murmur heard. Pulmonary:     Effort: Pulmonary effort is normal.     Breath sounds: Normal breath sounds. No wheezing, rhonchi or rales.  Abdominal:     General: Bowel sounds are normal.     Palpations: Abdomen is soft.     Tenderness: There is no abdominal tenderness. There is no guarding or rebound.  Musculoskeletal:        General: Normal range of motion.     Cervical back: Normal range of motion and neck supple.  Skin:    General: Skin is warm and dry.  Neurological:     General: No focal deficit present.     Mental Status: She is alert and oriented to person, place, and time.     GCS: GCS eye subscore is 4. GCS verbal subscore is 5. GCS motor subscore is 6.     Cranial Nerves: No cranial nerve deficit, dysarthria or facial asymmetry.     Sensory: No sensory deficit.     Motor: No weakness.     Coordination: Coordination normal.     (all labs ordered are listed, but only abnormal results are displayed) Labs Reviewed  CBC - Abnormal; Notable for the following components:      Result Value   WBC 10.6 (*)    All other components within normal limits  COMPREHENSIVE METABOLIC PANEL WITH GFR - Abnormal; Notable for the following components:   CO2 19 (*)    Glucose, Bld 105 (*)    Creatinine, Ser 1.07 (*)    Calcium 10.4 (*)    GFR, Estimated 50 (*)    All other components within normal limits  URINALYSIS, ROUTINE W REFLEX MICROSCOPIC   Results for orders placed or performed during the hospital encounter of 07/26/23  CBC   Collection Time: 07/26/23 12:57 PM   Result Value Ref Range   WBC 10.6 (H) 4.0 - 10.5 K/uL   RBC 4.73 3.87 - 5.11 MIL/uL   Hemoglobin 14.1 12.0 - 15.0 g/dL   HCT 56.2 63.9 - 53.9 %   MCV 92.4 80.0 - 100.0 fL   MCH 29.8 26.0 - 34.0 pg   MCHC 32.3 30.0 - 36.0 g/dL   RDW 86.6 88.4 - 84.4 %   Platelets 201 150 - 400 K/uL   nRBC 0.0 0.0 - 0.2 %  Comprehensive metabolic panel with GFR   Collection Time: 07/26/23 12:57 PM  Result Value Ref Range   Sodium 135 135 - 145 mmol/L   Potassium 4.8 3.5 - 5.1  mmol/L   Chloride 103 98 - 111 mmol/L   CO2 19 (L) 22 - 32 mmol/L   Glucose, Bld 105 (H) 70 - 99 mg/dL   BUN 20 8 - 23 mg/dL   Creatinine, Ser 8.92 (H) 0.44 - 1.00 mg/dL   Calcium 89.5 (H) 8.9 - 10.3 mg/dL   Total Protein 7.4 6.5 - 8.1 g/dL   Albumin 4.6 3.5 - 5.0 g/dL   AST 22 15 - 41 U/L   ALT 15 0 - 44 U/L   Alkaline Phosphatase 48 38 - 126 U/L   Total Bilirubin 0.4 0.0 - 1.2 mg/dL   GFR, Estimated 50 (L) >60 mL/min   Anion gap 13 5 - 15  Urinalysis, Routine w reflex microscopic -Urine, Clean Catch   Collection Time: 07/26/23  3:40 PM  Result Value Ref Range   Color, Urine YELLOW YELLOW   APPearance CLEAR CLEAR   Specific Gravity, Urine 1.012 1.005 - 1.030   pH 6.0 5.0 - 8.0   Glucose, UA NEGATIVE NEGATIVE mg/dL   Hgb urine dipstick NEGATIVE NEGATIVE   Bilirubin Urine NEGATIVE NEGATIVE   Ketones, ur NEGATIVE NEGATIVE mg/dL   Protein, ur NEGATIVE NEGATIVE mg/dL   Nitrite NEGATIVE NEGATIVE   Leukocytes,Ua NEGATIVE NEGATIVE     EKG: EKG Interpretation Date/Time:  Tuesday July 26 2023 12:14:21 EDT Ventricular Rate:  48 PR Interval:  174 QRS Duration:  104 QT Interval:  424 QTC Calculation: 378 R Axis:   56  Text Interpretation: Sinus bradycardia Otherwise normal ECG When compared with ECG of 16-Mar-2023 14:22, Vent. rate has decreased BY  25 BPM QRS duration has increased Confirmed by Dasie Faden ([id number]) on 07/26/2023 1:16:39 PM  Radiology: No results found.   Procedures   Medications Ordered in  the ED - No data to display                                  Medical Decision Making This patient presents to the ED for concern of generalized weakness, this involves an extensive number of treatment options, and is a complaint that carries with it a high risk of complications and morbidity.  The differential diagnosis includes sepsis/infection, arrhythmia, viral illness, UTI   Co morbidities that complicate the patient evaluation  PAF on Xarelto , HTN, HLD, GERD,    Additional history obtained:  Additional history and/or information obtained from chart review, notable for Dr. Godfrey recent notes reviewed: 03/25 - Ca Ch blocker doubled for 2 days for PAF - resolved 4/25 - stress to evaluated medication efficacy -   Lab Tests:  I Ordered, and personally interpreted labs.  The pertinent results include:   Cmet: CO2 19, Cr 1.07 CBC - no leukocytosis, normal hgb, normal plts    Imaging Studies ordered:  I ordered imaging studies including n/a I independently visualized and interpreted imaging which showed n/a I agree with the radiologist interpretation   Cardiac Monitoring:  The patient was maintained on a cardiac monitor.  I personally viewed and interpreted the cardiac monitored which showed an underlying rhythm of:  EKG Interpretation Date/Time:  Tuesday July 26 2023 12:14:21 EDT Ventricular Rate:  48 PR Interval:  174 QRS Duration:  104 QT Interval:  424 QTC Calculation: 378 R Axis:   56  Text Interpretation: Sinus bradycardia Otherwise normal ECG When compared with ECG of 16-Mar-2023 14:22, Vent. rate has decreased BY  25 BPM QRS duration has increased Confirmed  by Dasie Faden ([id number]) on 07/26/2023 1:16:39 PM   Medicines ordered and prescription drug management:  I ordered medication including n/a  for n/a Reevaluation of the patient after these medicines showed that the patient improved I have reviewed the patients home medicines and have made adjustments as  needed   Test Considered:  N/a   Critical Interventions:  N/a   Consultations Obtained:  I requested consultation with the cardiology, Dr. Victor,  and discussed lab and imaging findings as well as pertinent plan - they recommend: Decrease her metoprolol  from 50 mg to 25 mg; he will arrange for A-fib clinic to follow up with her in clinic   Problem List / ED Course:  Here with generalized weakness and fatigue, recurrent episodes of atrial fibrillation and bradycardia Labs reassuring EKG showing NSR, rate 48. ON recheck, rate 59 Discussed with cardiology, Dr. Victor. Will follow recommendations   Reevaluation:  After the interventions noted above, I reevaluated the patient and found that they have :improved   Social Determinants of Health:  Never a smoker   Disposition:  After consideration of the diagnostic results and the patients response to treatment, I feel that the patient would benefit from discharge home with return precautions.   Amount and/or Complexity of Data Reviewed Labs: ordered.  Risk Prescription drug management.        Final diagnoses:  Paroxysmal atrial fibrillation (HCC)  Bradycardia    ED Discharge Orders          Ordered    metoprolol  succinate (TOPROL -XL) 25 MG 24 hr tablet  Daily        07/26/23 1546               Odell Balls, PA-C 07/26/23 1616    Dasie Faden, MD 07/27/23 1740

## 2023-07-26 NOTE — ED Provider Notes (Signed)
 I provided a substantive portion of the care of this patient.  I personally made/approved the management plan for this patient and take responsibility for the patient management.  EKG Interpretation Date/Time:  Tuesday July 26 2023 12:14:21 EDT Ventricular Rate:  48 PR Interval:  174 QRS Duration:  104 QT Interval:  424 QTC Calculation: 378 R Axis:   56  Text Interpretation: Sinus bradycardia Otherwise normal ECG When compared with ECG of 16-Mar-2023 14:22, Vent. rate has decreased BY  25 BPM QRS duration has increased Confirmed by Dasie Faden ([id number]) on 07/26/2023 1:16:39 PM   Patient is EKG shows sinus bradycardia.  Patient here due to decreased heart rate as well as increased blood pressure.  She is on metoprolol .  Will reach out to cardiology for medication recommendations for blood pressure management once we stop her metoprolol    Dasie Faden, MD 07/26/23 1334

## 2023-07-26 NOTE — ED Triage Notes (Signed)
 Pt to ED reporting weakness and headache for a while and when patient checked her heart rate it was 49 bpm at home and blood pressure was elevated at 160/80.   No lightheadedness or dizziness. Patient has been compliant with blood pressure medications.

## 2023-07-26 NOTE — Discharge Instructions (Signed)
 As we discussed, as advised by cardiology, decrease your Metoprolol  from 50 mg to 25 mg. A new prescription has been provided. Follow up with Dr. Ganji by calling the office to schedule a time to be seen.   Return to the ED with any new or worsening symptoms.

## 2023-07-27 ENCOUNTER — Telehealth: Payer: Self-pay | Admitting: Cardiology

## 2023-07-27 DIAGNOSIS — I480 Paroxysmal atrial fibrillation: Secondary | ICD-10-CM

## 2023-07-27 DIAGNOSIS — R5381 Other malaise: Secondary | ICD-10-CM

## 2023-07-27 NOTE — Telephone Encounter (Signed)
 Pt is requesting a callback from Dr. Godfrey nurse regarding her hospital visit. She called to schedule a hospital f/u but wasn't satisfied with his first opening being 08/12/23 at 8am so she declined due to being unsure since it was some time from now. She'd like to speak with nurse about it and recent medication changes in the hospital. Please advise

## 2023-07-27 NOTE — Telephone Encounter (Signed)
 Per ED note--  I requested consultation with the cardiology, Dr. Victor, and discussed lab and imaging findings as well as pertinent plan - they recommend: Decrease her metoprolol  from 50 mg to 25 mg; he will arrange for A-fib clinic to follow up with her in clinic   Patient reports she is taking metoprolol  succinate 25 mg daily.  She was given a 20 day supply on 7/15 which will last until a few days after appointment with Dr Ladona on August 1.  Will cancel afib clinic appointment made by hospital for 8/6 since patient now has appointment on 8/1.  I told patient to continue current dose of Metoprolol  and I would call her back if Dr Ladona wanted to make any changes.  Reviewed with Dr Ladona and patient should continue Metoprolol  Succinate 25 mg daily

## 2023-07-27 NOTE — Telephone Encounter (Signed)
 I am not sure what dose of metoprolol  succinate she is taking.

## 2023-07-27 NOTE — Telephone Encounter (Signed)
 Spoke with pt regarding her hospital visit and a follow up appointment. Pt stated she was unsure about whether she needed an appointment. Pt was told we like to see pts after their hospital stay. Pt agreeable to keep follow up appointment on 8/1 with Dr. Ladona. Pt stated she will run out of Metoprolol  prescribed in the hospital before her appointment with Dr. Ladona. Pt was told this information would be sent to Dr. Ladona and his nurse for their suggestions. Pt verbalized understanding. All questions if any were answered.

## 2023-08-04 DIAGNOSIS — E538 Deficiency of other specified B group vitamins: Secondary | ICD-10-CM | POA: Diagnosis not present

## 2023-08-12 ENCOUNTER — Encounter: Payer: Self-pay | Admitting: Cardiology

## 2023-08-12 ENCOUNTER — Ambulatory Visit: Attending: Cardiology | Admitting: Cardiology

## 2023-08-12 ENCOUNTER — Other Ambulatory Visit (HOSPITAL_BASED_OUTPATIENT_CLINIC_OR_DEPARTMENT_OTHER): Payer: Self-pay

## 2023-08-12 VITALS — BP 136/59 | HR 59 | Resp 16 | Ht 60.0 in | Wt 170.0 lb

## 2023-08-12 DIAGNOSIS — I480 Paroxysmal atrial fibrillation: Secondary | ICD-10-CM | POA: Insufficient documentation

## 2023-08-12 DIAGNOSIS — I10 Essential (primary) hypertension: Secondary | ICD-10-CM | POA: Diagnosis not present

## 2023-08-12 MED ORDER — METOPROLOL SUCCINATE ER 25 MG PO TB24
25.0000 mg | ORAL_TABLET | Freq: Every day | ORAL | 2 refills | Status: AC
  Filled 2023-08-12: qty 90, 90d supply, fill #0
  Filled 2023-11-09: qty 90, 90d supply, fill #1
  Filled 2024-02-01: qty 90, 90d supply, fill #2

## 2023-08-12 MED ORDER — FLECAINIDE ACETATE 50 MG PO TABS
ORAL_TABLET | ORAL | 1 refills | Status: AC
  Filled 2023-08-12: qty 135, 90d supply, fill #0
  Filled 2023-11-09: qty 135, 90d supply, fill #1
  Filled 2024-02-01: qty 90, 60d supply, fill #2

## 2023-08-12 NOTE — Progress Notes (Signed)
 Cardiology Office Note:  .   Date:  08/12/2023  ID:  [name], DOB [birth year], MRN [id number] PCP: Royden Ronal Czar, FNP  [facility] Providers Cardiologist:  Gordy Bergamo, MD   History of Present Illness: .   Deborah  L Mcmillan is a 89 y.o. Caucasian female with carotid atherosclerosis, hypertension and hyperlipidemia, LDL in the range of 170-190, myopathy, reactive airway disease and multiple medication intolerances, paroxysmal atrial fibrillation, had called us  with recurrence of AF and I have doubled her Verapamil for a couple days and advised her that she may convert to sinus which she did. However she felt tired and wanted to follow up. She had similar dpisode in July 2024 and spontaneously converted after starting Verapamil.   In view of recurrent episodes, I had started her on flecainide  50 mg p.o. twice daily and scheduled her for a treadmill exercise stress test for therapeutic drug monitoring.  She presented to the emergency room on 07/26/2023 with generalized fatigue and weakness with low heart rate.  Metoprolol  succinate dose was reduced from 100 mg to 25 mg daily.  Procedures: Exercise tolerance test 05/03/2023: Patient exercised for 6 minutes and 53 seconds on modified Bruce protocol achieving 3.9 METS.  Left bundle branch block at baseline.  Nondiagnostic stress EKG.  Hypertensive blood pressure response.  Echocardiogram 08/17/2022: Normal LVEF at 60 to 65% with mild aortic, mitral, tricuspid and pulmonary valve regurgitation with no change from 04/03/2020.SABRA  Discussed the use of AI scribe software for clinical note transcription with the patient, who gave verbal consent to proceed.  History of Present Illness Deborah  L Mcmillan is an 89 year old female with atrial fibrillation who presents with fatigue and weakness.  She has experienced significant fatigue and weakness for four years, with a recent exacerbation after an emergency room visit where her medication was  adjusted. She felt good for three days post-adjustment before symptoms returned.  Her current medications include flecainide , metoprolol  succinate 25 mg, and diltiazem  CD 120 mg. During the ER visit, her heart rate was as low as 45 bpm, which has since improved to 55 bpm, yet fatigue persists. Previously, her heart rate ranged from 130s to 140s during atrial fibrillation episodes, leading to the initiation of metoprolol  and diltiazem .  She takes spironolactone  in the morning and diltiazem  at night. She has gained weight recently.   Labs   Lab Results  Component Value Date   CHOL 265 (H) 07/28/2022   HDL 68 07/28/2022   LDLCALC 163 (H) 07/28/2022   TRIG 187 (H) 07/28/2022   No results found for: LIPOA  Lab Results  Component Value Date   NA 135 07/26/2023   K 4.8 07/26/2023   CO2 19 (L) 07/26/2023   GLUCOSE 105 (H) 07/26/2023   BUN 20 07/26/2023   CREATININE 1.07 (H) 07/26/2023   CALCIUM 10.4 (H) 07/26/2023   EGFR 48.0 04/13/2023   GFRNONAA 50 (L) 07/26/2023      Latest Ref Rng & Units 07/26/2023   12:57 PM 07/28/2022    1:13 PM 11/18/2021   11:18 AM  BMP  Glucose 70 - 99 mg/dL 894  888  894   BUN 8 - 23 mg/dL 20  23  23    Creatinine 0.44 - 1.00 mg/dL 8.92  8.92  8.81   BUN/Creat Ratio 12 - 28  21    Sodium 135 - 145 mmol/L 135  138  131   Potassium 3.5 - 5.1 mmol/L 4.8  4.7  4.6  Chloride 98 - 111 mmol/L 103  101  97   CO2 22 - 32 mmol/L 19  19  23    Calcium 8.9 - 10.3 mg/dL 89.5  9.5  89.6       Latest Ref Rng & Units 07/26/2023   12:57 PM 07/28/2022    1:13 PM 11/18/2021   11:18 AM  CBC  WBC 4.0 - 10.5 K/uL 10.6  12.0  9.2   Hemoglobin 12.0 - 15.0 g/dL 85.8  85.1  84.2   Hematocrit 36.0 - 46.0 % 43.7  43.2  47.2   Platelets 150 - 400 K/uL 201  245  253     ROS  Review of Systems  Constitutional: Positive for malaise/fatigue.  Cardiovascular:  Negative for chest pain, dyspnea on exertion and leg swelling.   Physical Exam:   VS:  BP (!) 136/59 (BP Location:  Left Arm, Patient Position: Sitting, Cuff Size: Normal)   Pulse (!) 59   Resp 16   Ht 5' (1.524 m)   Wt 170 lb (77.1 kg)   SpO2 95%   BMI 33.20 kg/m    Wt Readings from Last 3 Encounters:  08/12/23 170 lb (77.1 kg)  03/16/23 167 lb (75.8 kg)  09/14/22 162 lb (73.5 kg)    Physical Exam Neck:     Vascular: No carotid bruit or JVD.  Cardiovascular:     Rate and Rhythm: Regular rhythm. Bradycardia present.     Pulses: Intact distal pulses.     Heart sounds: Normal heart sounds. No murmur heard.    No gallop.  Pulmonary:     Effort: Pulmonary effort is normal.     Breath sounds: Normal breath sounds.  Abdominal:     General: Bowel sounds are normal.     Palpations: Abdomen is soft.  Musculoskeletal:     Right lower leg: No edema.     Left lower leg: No edema.    Studies Reviewed: SABRA     EKG:    EKG Interpretation Date/Time:  Friday August 12 2023 08:12:54 EDT Ventricular Rate:  55 PR Interval:  222 QRS Duration:  100 QT Interval:  422 QTC Calculation: 403 R Axis:   30  Text Interpretation: EKG 08/12/2023: Sinus bradycardia with first-degree AV block at the rate of 55 bpm, otherwise normal EKG.  Compared to 07/26/2023, heart rate has improved from 48 bpm and first-degree AV block is new. Confirmed by Eshawn Coor, Jagadeesh ([id number]) on 08/12/2023 8:18:32 AM    Medications ordered    Meds ordered this encounter  Medications   flecainide  (TAMBOCOR ) 50 MG tablet    Sig: Take ONE-HALF tablet (25 mg total) by mouth in the morning AND 1 tablet (50 mg total) every evening as directed    Dispense:  180 tablet    Refill:  1    **Patient requests 90 days supply**   metoprolol  succinate (TOPROL -XL) 25 MG 24 hr tablet    Sig: Take 1 tablet (25 mg total) by mouth daily.    Dispense:  90 tablet    Refill:  2     ASSESSMENT AND PLAN: .      ICD-10-CM   1. Paroxysmal atrial fibrillation (HCC)  I48.0 EKG 12-Lead    flecainide  (TAMBOCOR ) 50 MG tablet    2. Primary hypertension  I10       Assessment & Plan Paroxysmal atrial fibrillation Paroxysmal atrial fibrillation managed with flecainide . Flecainide  has been effective in maintaining sinus rhythm but has potential for causing QRS  widening, indicating a risk of toxicity. Stress test on May 03, 2023, showed QRS widening, suggesting flecainide  is at the top level of the therapeutic curve. - Reduce flecainide  dose to 50 mg: half tablet in the morning and one full tablet at night  Bradycardia due to medication effect Bradycardia secondary to the combination of diltiazem , flecainide , and metoprolol . Heart rate was as low as 45 bpm, contributing to fatigue and weakness. Current heart rate is 55 bpm, with a target of 60-70 bpm. - Discontinue diltiazem  - Continue metoprolol  succinate 25 mg once daily - Monitor heart rate and symptoms  Hypertension Hypertension is well controlled with current medication regimen. Blood pressure management is important to prevent complications. - Continue metoprolol  succinate 25 mg once daily - Continue spironolactone  50 mg once daily - Monitor blood pressure regularly - Advise on reducing salt intake  Obesity Weight gain noted. Obesity management is important for overall cardiovascular health.  Fatigue secondary to medication effect Fatigue and weakness attributed to bradycardia from medication effects. Symptoms improved after reduction of metoprolol  dose in the emergency department. Further improvement expected with discontinuation of diltiazem  and adjustment of flecainide  dosing. - Discontinue diltiazem  - Adjust flecainide  dosing as per atrial fibrillation management - Monitor for improvement in fatigue and energy levels  Office visit in 6 months or sooner if problems.   Signed,  Gordy Bergamo, MD, [facility] 08/12/2023, 6:40 PM [facility] [address] [place], [place] [address] Phone: [phone]. Fax:  [phone]

## 2023-08-12 NOTE — Patient Instructions (Signed)
 Medication Instructions:  Stop Cardizem  Change flecainide  to  ONE-HALF tablet (25 mg total) by mouth in the morning AND 1 tablet (50 mg total) every evening as directed  *If you need a refill on your cardiac medications before your next appointment, please call your pharmacy*  Lab Work: none If you have labs (blood work) drawn today and your tests are completely normal, you will receive your results only by: MyChart Message (if you have MyChart) OR A paper copy in the mail If you have any lab test that is abnormal or we need to change your treatment, we will call you to review the results.  Testing/Procedures: none  Follow-Up: At [facility], you and your health needs are our priority.  As part of our continuing mission to provide you with exceptional heart care, our providers are all part of one team.  This team includes your primary Cardiologist (physician) and Advanced Practice Providers or APPs (Physician Assistants and Nurse Practitioners) who all work together to provide you with the care you need, when you need it.  Your next appointment:   6 month(s)  Provider:   Gordy Bergamo, MD    We recommend signing up for the patient portal called MyChart.  Sign up information is provided on this After Visit Summary.  MyChart is used to connect with patients for Virtual Visits (Telemedicine).  Patients are able to view lab/test results, encounter notes, upcoming appointments, etc.  Non-urgent messages can be sent to your provider as well.   To learn more about what you can do with MyChart, go to [email or web address].   Other Instructions

## 2023-08-17 ENCOUNTER — Ambulatory Visit (HOSPITAL_COMMUNITY): Admitting: Internal Medicine

## 2023-08-29 ENCOUNTER — Other Ambulatory Visit (HOSPITAL_BASED_OUTPATIENT_CLINIC_OR_DEPARTMENT_OTHER): Payer: Self-pay

## 2023-08-29 ENCOUNTER — Other Ambulatory Visit: Payer: Self-pay | Admitting: Cardiology

## 2023-08-29 DIAGNOSIS — I480 Paroxysmal atrial fibrillation: Secondary | ICD-10-CM

## 2023-08-29 MED ORDER — RIVAROXABAN 15 MG PO TABS
15.0000 mg | ORAL_TABLET | Freq: Every day | ORAL | 5 refills | Status: AC
  Filled 2023-09-03: qty 90, 90d supply, fill #0
  Filled 2023-11-28: qty 90, 90d supply, fill #1

## 2023-08-29 MED FILL — Rivaroxaban Tab 15 MG: ORAL | 30 days supply | Qty: 30 | Fill #2 | Status: CN

## 2023-08-29 NOTE — Telephone Encounter (Signed)
 Prescription refill request for Xarelto  received.  Indication:afib Last office visit:8/25 Weight:77.1  kg Age:89 Scr:1.07  7/25 CrCl:45.09  ml/min  Prescription refilled

## 2023-09-03 ENCOUNTER — Other Ambulatory Visit (HOSPITAL_BASED_OUTPATIENT_CLINIC_OR_DEPARTMENT_OTHER): Payer: Self-pay

## 2023-09-06 ENCOUNTER — Other Ambulatory Visit (HOSPITAL_BASED_OUTPATIENT_CLINIC_OR_DEPARTMENT_OTHER): Payer: Self-pay

## 2023-09-06 MED ORDER — OMEPRAZOLE 20 MG PO CPDR
20.0000 mg | DELAYED_RELEASE_CAPSULE | Freq: Two times a day (BID) | ORAL | 3 refills | Status: AC
  Filled 2023-09-06: qty 180, 90d supply, fill #0
  Filled 2023-11-29: qty 180, 90d supply, fill #1

## 2023-09-08 ENCOUNTER — Ambulatory Visit: Payer: BLUE CROSS/BLUE SHIELD | Admitting: Cardiology

## 2023-09-08 DIAGNOSIS — E538 Deficiency of other specified B group vitamins: Secondary | ICD-10-CM | POA: Diagnosis not present

## 2023-09-13 ENCOUNTER — Telehealth: Payer: Self-pay | Admitting: Pharmacy Technician

## 2023-09-13 ENCOUNTER — Other Ambulatory Visit (HOSPITAL_COMMUNITY): Payer: Self-pay

## 2023-09-13 NOTE — Telephone Encounter (Signed)
   Pharmacy Patient Advocate Encounter   Received notification from Onbase that prior authorization for REPATHA  is required/requested.   Insurance verification completed.   The patient is insured through [facility] .   Per test claim: PA NOT REQUIRED SO WONT LET ME DO PA

## 2023-09-26 ENCOUNTER — Other Ambulatory Visit (HOSPITAL_COMMUNITY): Payer: Self-pay

## 2023-09-29 ENCOUNTER — Other Ambulatory Visit (HOSPITAL_BASED_OUTPATIENT_CLINIC_OR_DEPARTMENT_OTHER): Payer: Self-pay

## 2023-09-29 MED ORDER — FLUTICASONE PROPIONATE 50 MCG/ACT NA SUSP
2.0000 | Freq: Every day | NASAL | 1 refills | Status: AC
  Filled 2023-09-29: qty 48, 90d supply, fill #0
  Filled 2023-12-26 (×2): qty 48, 90d supply, fill #1

## 2023-10-04 ENCOUNTER — Telehealth: Payer: Self-pay | Admitting: Pharmacy Technician

## 2023-10-04 NOTE — Telephone Encounter (Signed)
   Pharmacy Patient Advocate Encounter   Received notification from Onbase that prior authorization for REPATHA  is required/requested.   Insurance verification completed.   The patient is insured through [facility] .   Per test claim: PA required; PA submitted to above mentioned insurance via Latent Key/confirmation #/EOC [id number] Status is pending

## 2023-10-04 NOTE — Telephone Encounter (Signed)
 Pharmacy Patient Advocate Encounter  Received notification from [employer] that Prior Authorization for repatha  has been APPROVED from 10/04/23 to 10/11/2024   PA #/Case ID/Reference #: [id number]

## 2023-10-13 ENCOUNTER — Other Ambulatory Visit: Payer: Self-pay | Admitting: Cardiology

## 2023-10-13 ENCOUNTER — Other Ambulatory Visit (HOSPITAL_BASED_OUTPATIENT_CLINIC_OR_DEPARTMENT_OTHER): Payer: Self-pay

## 2023-10-13 MED ORDER — SPIRONOLACTONE 50 MG PO TABS
50.0000 mg | ORAL_TABLET | Freq: Every morning | ORAL | 3 refills | Status: AC
  Filled 2023-10-13: qty 90, 90d supply, fill #0
  Filled 2024-01-08: qty 90, 90d supply, fill #1

## 2023-10-15 ENCOUNTER — Other Ambulatory Visit (HOSPITAL_BASED_OUTPATIENT_CLINIC_OR_DEPARTMENT_OTHER): Payer: Self-pay

## 2023-10-24 ENCOUNTER — Other Ambulatory Visit (HOSPITAL_BASED_OUTPATIENT_CLINIC_OR_DEPARTMENT_OTHER): Payer: Self-pay

## 2023-11-09 ENCOUNTER — Other Ambulatory Visit: Payer: Self-pay

## 2023-11-09 ENCOUNTER — Other Ambulatory Visit (HOSPITAL_BASED_OUTPATIENT_CLINIC_OR_DEPARTMENT_OTHER): Payer: Self-pay

## 2023-11-10 ENCOUNTER — Other Ambulatory Visit (HOSPITAL_BASED_OUTPATIENT_CLINIC_OR_DEPARTMENT_OTHER): Payer: Self-pay

## 2023-11-15 DIAGNOSIS — E538 Deficiency of other specified B group vitamins: Secondary | ICD-10-CM | POA: Diagnosis not present

## 2023-11-15 DIAGNOSIS — E559 Vitamin D deficiency, unspecified: Secondary | ICD-10-CM | POA: Diagnosis not present

## 2023-11-15 DIAGNOSIS — E7800 Pure hypercholesterolemia, unspecified: Secondary | ICD-10-CM | POA: Diagnosis not present

## 2023-11-15 DIAGNOSIS — Z0000 Encounter for general adult medical examination without abnormal findings: Secondary | ICD-10-CM | POA: Diagnosis not present

## 2023-11-16 LAB — LAB REPORT - SCANNED
EGFR: 55
TSH: 1.35 (ref 0.41–5.90)

## 2023-11-22 DIAGNOSIS — J302 Other seasonal allergic rhinitis: Secondary | ICD-10-CM | POA: Diagnosis not present

## 2023-11-22 DIAGNOSIS — E7800 Pure hypercholesterolemia, unspecified: Secondary | ICD-10-CM | POA: Diagnosis not present

## 2023-11-22 DIAGNOSIS — M810 Age-related osteoporosis without current pathological fracture: Secondary | ICD-10-CM | POA: Diagnosis not present

## 2023-11-22 DIAGNOSIS — Z23 Encounter for immunization: Secondary | ICD-10-CM | POA: Diagnosis not present

## 2023-11-22 DIAGNOSIS — Z7901 Long term (current) use of anticoagulants: Secondary | ICD-10-CM | POA: Diagnosis not present

## 2023-11-22 DIAGNOSIS — I480 Paroxysmal atrial fibrillation: Secondary | ICD-10-CM | POA: Diagnosis not present

## 2023-11-22 DIAGNOSIS — E538 Deficiency of other specified B group vitamins: Secondary | ICD-10-CM | POA: Diagnosis not present

## 2023-11-29 ENCOUNTER — Other Ambulatory Visit: Payer: Self-pay

## 2023-11-29 ENCOUNTER — Other Ambulatory Visit (HOSPITAL_BASED_OUTPATIENT_CLINIC_OR_DEPARTMENT_OTHER): Payer: Self-pay

## 2023-12-23 ENCOUNTER — Encounter: Payer: Self-pay | Admitting: Cardiology

## 2023-12-26 ENCOUNTER — Other Ambulatory Visit (HOSPITAL_BASED_OUTPATIENT_CLINIC_OR_DEPARTMENT_OTHER): Payer: Self-pay

## 2024-01-11 ENCOUNTER — Other Ambulatory Visit (HOSPITAL_BASED_OUTPATIENT_CLINIC_OR_DEPARTMENT_OTHER): Payer: Self-pay

## 2024-01-17 IMAGING — CT CT ABD-PELV W/ CM
2 of 5 series · 15 of 46 positions shown, 17 images · IV contrast (APPLIED)
Comparison: None.

CLINICAL DATA: Pain right lower quadrant

EXAM:
CT ABDOMEN AND PELVIS WITH CONTRAST
TECHNIQUE: Multidetector CT imaging of the abdomen and pelvis was performed
using the standard protocol following bolus administration of
intravenous contrast.

[Series 2: abd pel w · axial · 0.87mm/px · z∈[+773,+1158]mm · 12 of 87 slices shown, 14 images]
[im 5/87  soft-tissue]
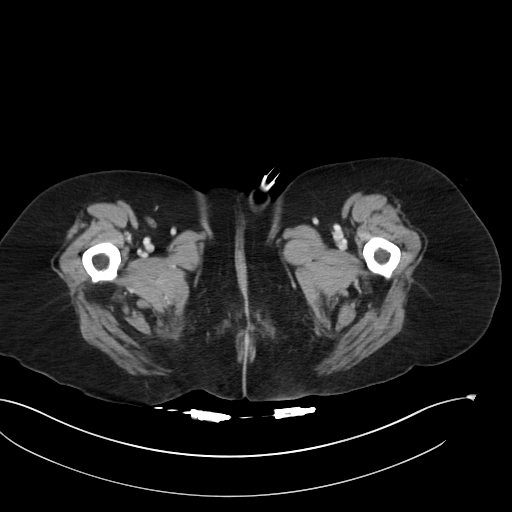
[im 5/87  bone]
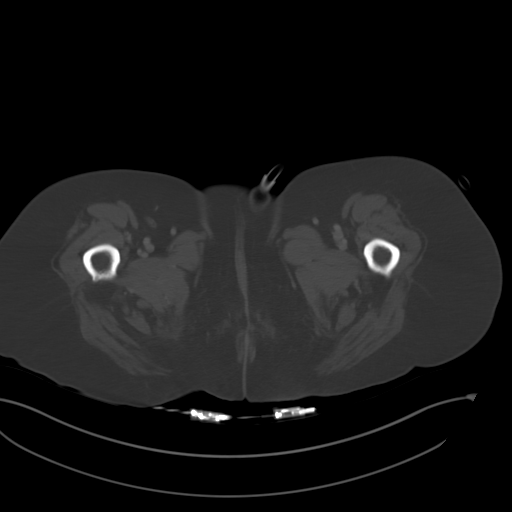
[im 13/87  soft-tissue]
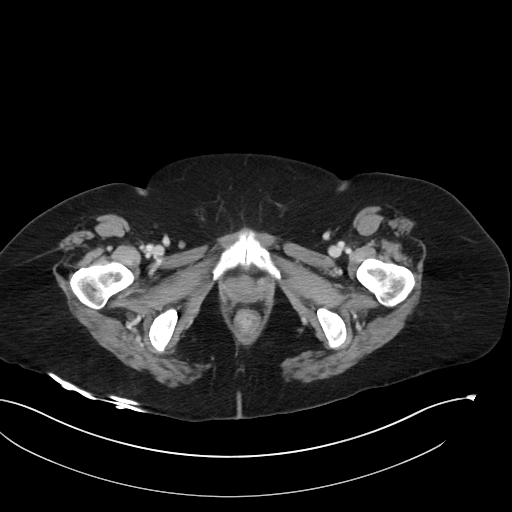
[im 18/87  soft-tissue]
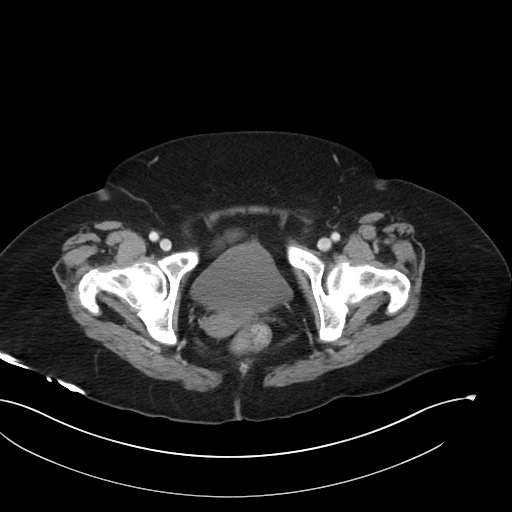
[im 26/87  soft-tissue]
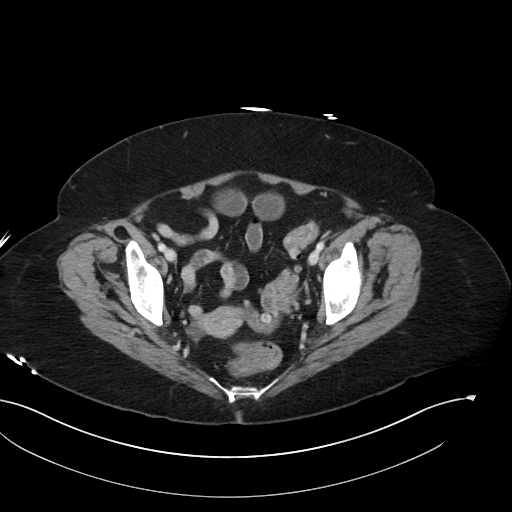
[im 35/87  soft-tissue]
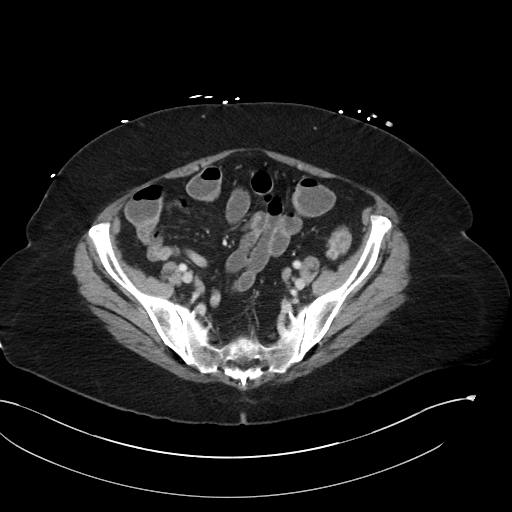
[im 39/87  soft-tissue]
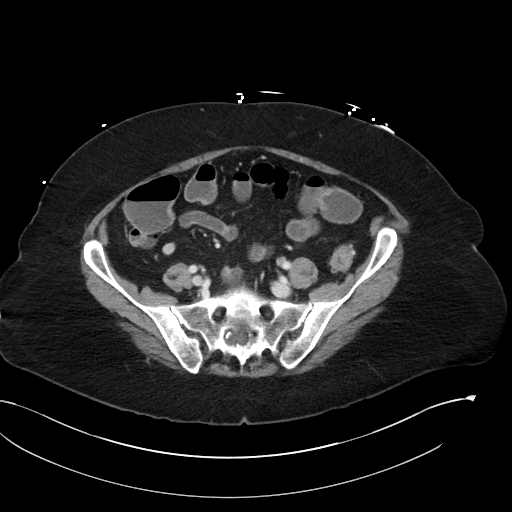
[im 48/87  soft-tissue]
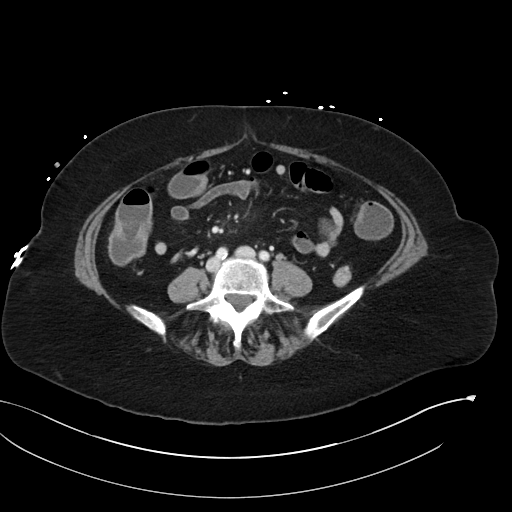
[im 52/87  soft-tissue]
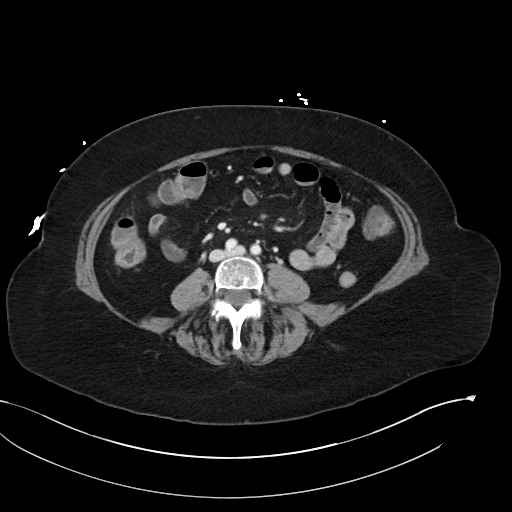
[im 61/87  soft-tissue]
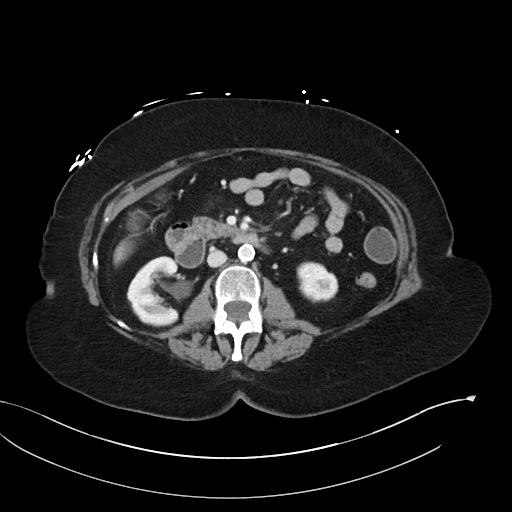
[im 61/87  bone]
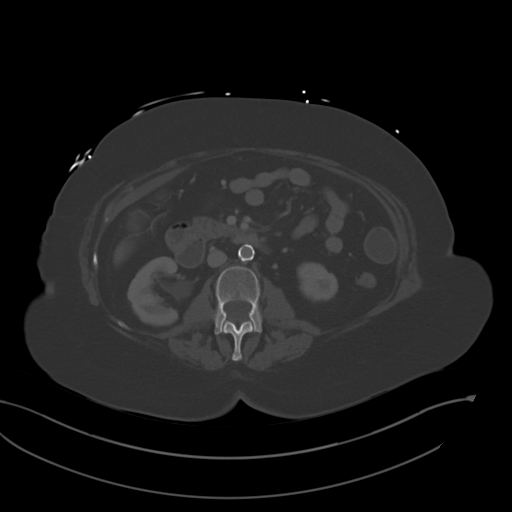
[im 69/87  soft-tissue]
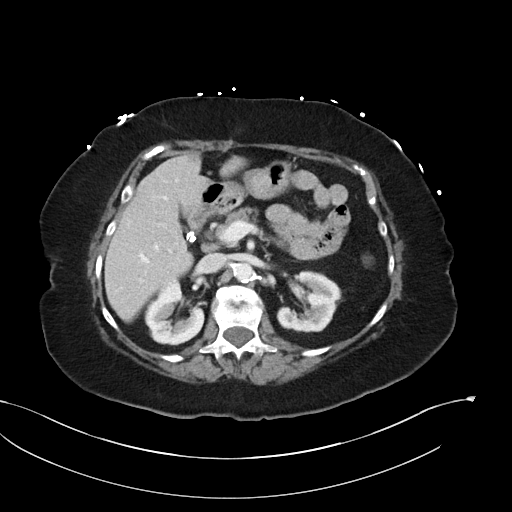
[im 74/87  soft-tissue]
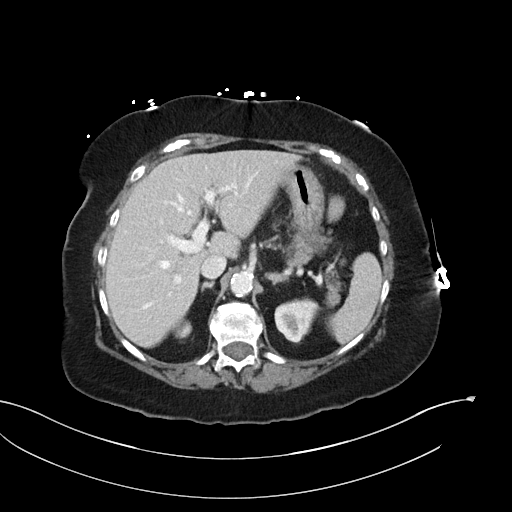
[im 82/87  soft-tissue]
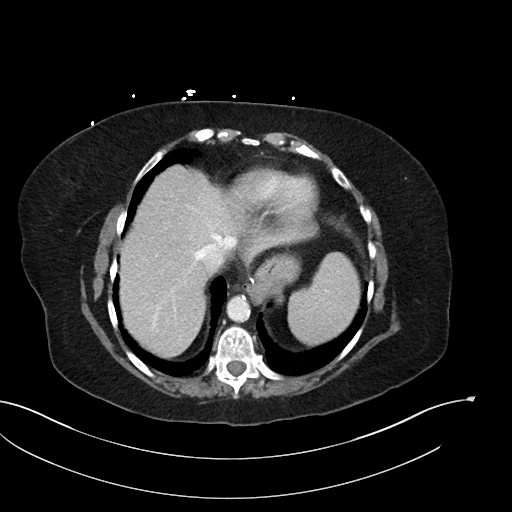

[Series 5: coronal · coronal · 0.93mm/px · 3 of 95 slices shown]
[im 32/95  soft-tissue]
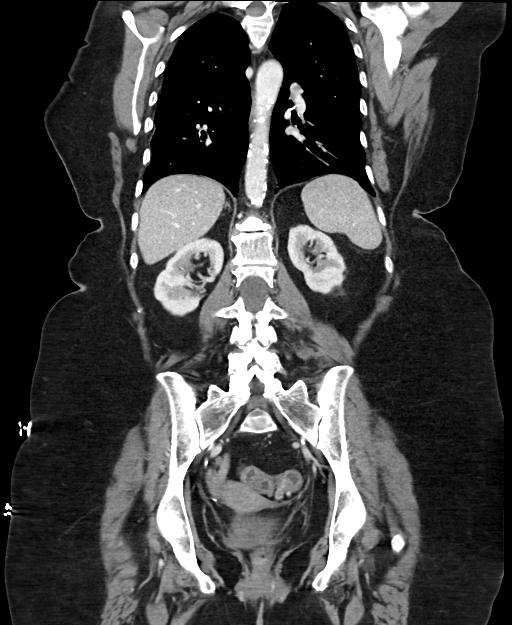
[im 42/95  soft-tissue]
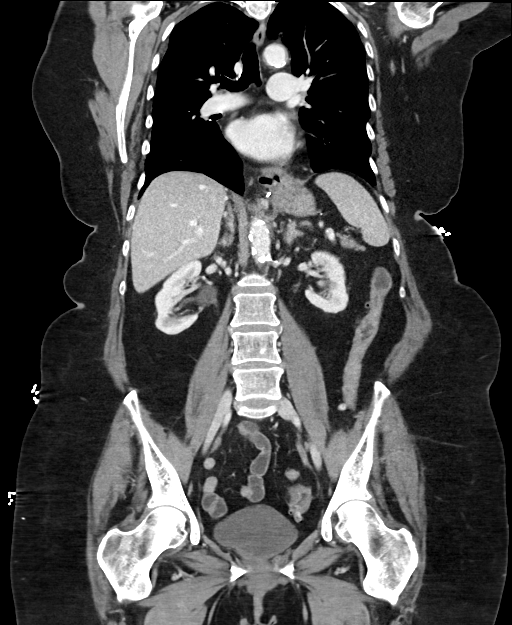
[im 53/95  soft-tissue]
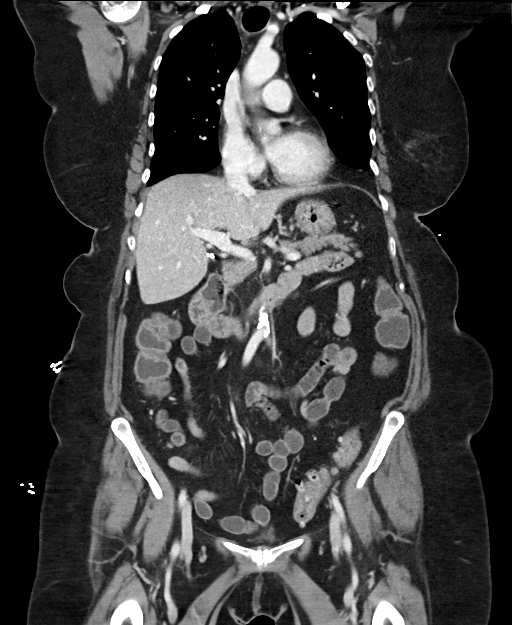

[15 of 46 positions shown; findings below may reference images not displayed]

RADIATION DOSE REDUCTION: This exam was performed according to the
departmental dose-optimization program which includes automated
exposure control, adjustment of the mA and/or kV according to
patient size and/or use of iterative reconstruction technique.

CONTRAST:  100mL OMNIPAQUE IOHEXOL 300 MG/ML  SOLN
FINDINGS: Lower chest: There is no focal consolidation in the lower lung
fields.

Hepatobiliary: There is 6 mm low-density in the right lobe in image
13, possibly a cyst or hemangioma. There is no dilation of bile
ducts. Surgical clips are seen in gallbladder fossa.

Pancreas: No focal abnormality is seen.

Spleen: Unremarkable.

Adrenals/Urinary Tract: Adrenals are unremarkable. There is no
hydronephrosis. Prominence of right renal pelvis may be due to
extrarenal location or mild ureteropelvic junction obstruction. AP
diameter of right renal pelvis measures 2.1 cm. There are no renal
or ureteral stones. There are foci of cortical thinning in the
kidneys, more so on the left side. Urinary bladder is unremarkable.

Stomach/Bowel: Small hiatal hernia is seen. Surgical clips are noted
adjacent to the stomach. Small bowel loops are not dilated. There is
fluid in the lumen of distal small bowel loops. Appendix is not
seen. There is no focal pericecal inflammation. There is fluid in
the lumen of colon. There are scattered diverticula in colon without
signs of focal diverticulitis.

Vascular/Lymphatic: Fairly extensive calcifications are seen in the
aorta.

Reproductive: Unremarkable.

Other: There is minimal amount of free fluid in the right side of
pelvic cavity. There is no pneumoperitoneum. Small umbilical hernia
containing fat is seen.

Musculoskeletal: Unremarkable.
IMPRESSION: There is no evidence of intestinal obstruction or pneumoperitoneum.

There is fluid in the lumen of small bowel loops and colon
suggesting possible nonspecific enterocolitis. There is minimal
amount of free fluid in the right side of pelvis, possibly related
to enteritis.

There is no significant hydronephrosis. Prominence of right renal
pelvis may be due to extrarenal location or suggest mild
ureteropelvic junction obstruction. There is no dilation of minor
calices.

There is 6 mm low-density lesion in the right lobe of liver
suggesting possible cyst or hemangioma. Small hiatal hernia is seen.
Diverticulosis of colon without signs of focal diverticulitis.

Other findings as described in the body of the report.

## 2024-02-01 ENCOUNTER — Other Ambulatory Visit (HOSPITAL_BASED_OUTPATIENT_CLINIC_OR_DEPARTMENT_OTHER): Payer: Self-pay

## 2024-02-02 ENCOUNTER — Other Ambulatory Visit (HOSPITAL_BASED_OUTPATIENT_CLINIC_OR_DEPARTMENT_OTHER): Payer: Self-pay

## 2024-03-06 ENCOUNTER — Ambulatory Visit: Admitting: Cardiology
